# Patient Record
Sex: Male | Born: 1940 | Race: White | Hispanic: No | Marital: Married | Smoking: Never smoker
Health system: Southern US, Community
[De-identification: ages and names within clinical notes are randomized; demographics above are authoritative.]

## PROBLEM LIST (undated history)

## (undated) DIAGNOSIS — I509 Heart failure, unspecified: Secondary | ICD-10-CM

## (undated) DIAGNOSIS — I00 Rheumatic fever without heart involvement: Secondary | ICD-10-CM

## (undated) DIAGNOSIS — I10 Essential (primary) hypertension: Secondary | ICD-10-CM

## (undated) DIAGNOSIS — M109 Gout, unspecified: Secondary | ICD-10-CM

## (undated) HISTORY — PX: HAMMER TOE SURGERY: SHX385

---

## 2013-05-12 ENCOUNTER — Other Ambulatory Visit: Payer: Self-pay | Admitting: *Deleted

## 2013-05-12 MED ORDER — ALLOPURINOL 100 MG PO TABS: 100.0000 mg | ORAL_TABLET | Freq: Two times a day (BID) | ORAL | Status: DC

## 2013-09-09 ENCOUNTER — Other Ambulatory Visit: Payer: Self-pay | Admitting: *Deleted

## 2013-09-09 MED ORDER — ALLOPURINOL 100 MG PO TABS: 100.0000 mg | ORAL_TABLET | Freq: Two times a day (BID) | ORAL | Status: DC

## 2013-09-09 NOTE — Telephone Encounter (Signed)
[facility] sent fax over requesting allopurinol 100 mg. Filled by dr Milinda Pointer.

## 2014-09-22 ENCOUNTER — Encounter
Admission: RE | Admit: 2014-09-22 | Discharge: 2014-09-22 | Disposition: A | Discharge status: Home / Self Care | Payer: Medicare PPO | Source: Ambulatory Visit | Attending: Ophthalmology | Admitting: Ophthalmology

## 2014-09-22 DIAGNOSIS — Z01812 Encounter for preprocedural laboratory examination: Secondary | ICD-10-CM | POA: Insufficient documentation

## 2014-09-22 DIAGNOSIS — H2512 Age-related nuclear cataract, left eye: Secondary | ICD-10-CM | POA: Insufficient documentation

## 2014-09-22 DIAGNOSIS — Z79899 Other long term (current) drug therapy: Secondary | ICD-10-CM | POA: Diagnosis not present

## 2014-09-22 DIAGNOSIS — I10 Essential (primary) hypertension: Secondary | ICD-10-CM | POA: Diagnosis not present

## 2014-09-22 DIAGNOSIS — Z01810 Encounter for preprocedural cardiovascular examination: Secondary | ICD-10-CM | POA: Insufficient documentation

## 2014-09-22 HISTORY — DX: Essential (primary) hypertension: I10

## 2014-09-22 HISTORY — DX: Gout, unspecified: M10.9

## 2014-09-22 LAB — POTASSIUM: POTASSIUM: 3.7 mmol/L (ref 3.5–5.1)

## 2014-09-26 ENCOUNTER — Ambulatory Visit: Payer: Medicare PPO | Admitting: *Deleted

## 2014-09-26 ENCOUNTER — Encounter: Payer: Self-pay | Admitting: Ophthalmology

## 2014-09-26 ENCOUNTER — Ambulatory Visit
Admission: RE | Admit: 2014-09-26 | Discharge: 2014-09-26 | Disposition: A | Discharge status: Home / Self Care | Payer: Medicare PPO | Source: Ambulatory Visit | Attending: Ophthalmology | Admitting: Ophthalmology

## 2014-09-26 ENCOUNTER — Encounter
Admission: RE | Disposition: A | Discharge status: Home / Self Care | Payer: Self-pay | Source: Ambulatory Visit | Attending: Ophthalmology

## 2014-09-26 DIAGNOSIS — I10 Essential (primary) hypertension: Secondary | ICD-10-CM | POA: Insufficient documentation

## 2014-09-26 DIAGNOSIS — Z7982 Long term (current) use of aspirin: Secondary | ICD-10-CM | POA: Diagnosis not present

## 2014-09-26 DIAGNOSIS — H2511 Age-related nuclear cataract, right eye: Secondary | ICD-10-CM | POA: Insufficient documentation

## 2014-09-26 DIAGNOSIS — Z79899 Other long term (current) drug therapy: Secondary | ICD-10-CM | POA: Insufficient documentation

## 2014-09-26 DIAGNOSIS — M109 Gout, unspecified: Secondary | ICD-10-CM | POA: Diagnosis not present

## 2014-09-26 HISTORY — PX: CATARACT EXTRACTION W/PHACO: SHX586

## 2014-09-26 SURGERY — PHACOEMULSIFICATION, CATARACT, WITH IOL INSERTION
Anesthesia: Monitor Anesthesia Care | Site: Eye | Laterality: Left | Wound class: Clean

## 2014-09-26 MED ORDER — LIDOCAINE HCL (PF) 4 % IJ SOLN
INTRAMUSCULAR | Status: AC
  Filled 2014-09-26: qty 10

## 2014-09-26 MED ORDER — MOXIFLOXACIN HCL 0.5 % OP SOLN - NO CHARGE
OPHTHALMIC | Status: DC | PRN
  Administered 2014-09-26: 1 [drp]

## 2014-09-26 MED ORDER — BUPIVACAINE HCL (PF) 0.75 % IJ SOLN
INTRAMUSCULAR | Status: AC
  Filled 2014-09-26: qty 10

## 2014-09-26 MED ORDER — NA CHONDROIT SULF-NA HYALURON 40-17 MG/ML IO SOLN
INTRAOCULAR | Status: DC | PRN
Start: 2014-09-26 — End: 2014-09-26
  Administered 2014-09-26: 1 mL via INTRAOCULAR

## 2014-09-26 MED ORDER — CEFUROXIME OPHTHALMIC INJECTION 1 MG/0.1 ML
INJECTION | OPHTHALMIC | Status: AC
  Filled 2014-09-26: qty 0.1

## 2014-09-26 MED ORDER — SODIUM CHLORIDE 0.9 % IV SOLN
INTRAVENOUS | Status: DC
  Administered 2014-09-26: 500 mL via INTRAVENOUS

## 2014-09-26 MED ORDER — TETRACAINE HCL 0.5 % OP SOLN
OPHTHALMIC | Status: DC | PRN
  Administered 2014-09-26: 1 [drp]

## 2014-09-26 MED ORDER — ALFENTANIL 500 MCG/ML IJ INJ
INJECTION | INTRAMUSCULAR | Status: DC | PRN
  Administered 2014-09-26: 1000 ug via INTRAVENOUS

## 2014-09-26 MED ORDER — CEFUROXIME OPHTHALMIC INJECTION 1 MG/0.1 ML
INJECTION | OPHTHALMIC | Status: DC | PRN
  Administered 2014-09-26: 0.1 mL via INTRACAMERAL

## 2014-09-26 MED ORDER — FENTANYL CITRATE (PF) 100 MCG/2ML IJ SOLN
INTRAMUSCULAR | Status: DC | PRN
  Administered 2014-09-26: 50 ug via INTRAVENOUS

## 2014-09-26 MED ORDER — MIDAZOLAM HCL 2 MG/2ML IJ SOLN
INTRAMUSCULAR | Status: DC | PRN
  Administered 2014-09-26: 0.5 mg via INTRAVENOUS

## 2014-09-26 MED ORDER — CARBACHOL 0.01 % IO SOLN
INTRAOCULAR | Status: DC | PRN
  Administered 2014-09-26: 0.5 mL via INTRAOCULAR

## 2014-09-26 MED ORDER — PHENYLEPHRINE HCL 10 % OP SOLN
1.0000 [drp] | OPHTHALMIC | Status: AC
  Administered 2014-09-26 (×4): 1 [drp] via OPHTHALMIC

## 2014-09-26 MED ORDER — TETRACAINE HCL 0.5 % OP SOLN
OPHTHALMIC | Status: AC
  Filled 2014-09-26: qty 2

## 2014-09-26 MED ORDER — EPINEPHRINE HCL 1 MG/ML IJ SOLN
INTRAMUSCULAR | Status: AC
  Filled 2014-09-26: qty 2

## 2014-09-26 MED ORDER — HYALURONIDASE HUMAN 150 UNIT/ML IJ SOLN
INTRAMUSCULAR | Status: AC
  Filled 2014-09-26: qty 1

## 2014-09-26 MED ORDER — EPINEPHRINE HCL 1 MG/ML IJ SOLN
INTRAOCULAR | Status: DC | PRN
  Administered 2014-09-26: 200 mL

## 2014-09-26 MED ORDER — CYCLOPENTOLATE HCL 2 % OP SOLN
1.0000 [drp] | OPHTHALMIC | Status: AC
  Administered 2014-09-26 (×4): 1 [drp] via OPHTHALMIC

## 2014-09-26 MED ORDER — LIDOCAINE HCL (PF) 1 % IJ SOLN
INTRAOCULAR | Status: DC | PRN
  Administered 2014-09-26: .5 mL

## 2014-09-26 MED ORDER — NA CHONDROIT SULF-NA HYALURON 40-17 MG/ML IO SOLN
INTRAOCULAR | Status: AC
  Filled 2014-09-26: qty 1

## 2014-09-26 MED ORDER — LIDOCAINE HCL (PF) 4 % IJ SOLN
INTRAMUSCULAR | Status: DC | PRN
  Administered 2014-09-26: 12:00:00

## 2014-09-26 MED ORDER — MOXIFLOXACIN HCL 0.5 % OP SOLN
1.0000 [drp] | OPHTHALMIC | Status: AC
  Administered 2014-09-26 (×3): 1 [drp] via OPHTHALMIC

## 2014-09-26 SURGICAL SUPPLY — 28 items
AVTIVE FMS ×3 IMPLANT
CORD BIP STRL DISP 12FT (MISCELLANEOUS) ×3 IMPLANT
DRAPE XRAY CASSETTE 23X24 (DRAPES) ×3 IMPLANT
ERASER HMR WETFIELD 18G (MISCELLANEOUS) ×3 IMPLANT
GLOVE BIO SURGEON STRL SZ8 (GLOVE) ×3 IMPLANT
GLOVE SURG LX 6.5 MICRO (GLOVE) ×2
GLOVE SURG LX 8.0 MICRO (GLOVE) ×2
GLOVE SURG LX STRL 6.5 MICRO (GLOVE) ×1 IMPLANT
GLOVE SURG LX STRL 8.0 MICRO (GLOVE) ×1 IMPLANT
GOWN STRL REUS W/ TWL LRG LVL3 (GOWN DISPOSABLE) ×1 IMPLANT
GOWN STRL REUS W/ TWL XL LVL3 (GOWN DISPOSABLE) ×1 IMPLANT
GOWN STRL REUS W/TWL LRG LVL3 (GOWN DISPOSABLE) ×2
GOWN STRL REUS W/TWL XL LVL3 (GOWN DISPOSABLE) ×2
LENS IOL ACRSF IQ ULTRA 16.5 (Intraocular Lens) ×1 IMPLANT
LENS IOL ACRYSOF IQ 16.5 (Intraocular Lens) ×3 IMPLANT
PACK CATARACT (MISCELLANEOUS) ×3 IMPLANT
PACK CATARACT DINGLEDEIN LX (MISCELLANEOUS) ×3 IMPLANT
PACK EYE AFTER SURG (MISCELLANEOUS) ×3 IMPLANT
SHLD EYE VISITEC  UNIV (MISCELLANEOUS) ×3 IMPLANT
SOL PREP PVP 2OZ (MISCELLANEOUS) ×3
SOLUTION PREP PVP 2OZ (MISCELLANEOUS) ×1 IMPLANT
SUT ETHILON 10 0 CS140 6 (SUTURE) ×3 IMPLANT
SUT SILK 5-0 (SUTURE) ×3 IMPLANT
SYR 5ML LL (SYRINGE) ×3 IMPLANT
SYR TB 1ML 27GX1/2 LL (SYRINGE) ×3 IMPLANT
WATER STERILE IRR 1000ML POUR (IV SOLUTION) ×3 IMPLANT
WIPE NON LINTING 3.25X3.25 (MISCELLANEOUS) ×3 IMPLANT
ultrasert lens 16.5 ×3 IMPLANT

## 2014-09-26 NOTE — Interval H&P Note (Signed)
History and Physical Interval Note:  09/26/2014 11:49 AM  Gary Harmon  has presented today for surgery, with the diagnosis of CATARACT  The various methods of treatment have been discussed with the patient and family. After consideration of risks, benefits and other options for treatment, the patient has consented to  Procedure(s): CATARACT EXTRACTION PHACO AND INTRAOCULAR LENS PLACEMENT ([place]) (Left) as a surgical intervention .  The patient's history has been reviewed, patient examined, no change in status, stable for surgery.  I have reviewed the patient's chart and labs.  Questions were answered to the patient's satisfaction.     [name]

## 2014-09-26 NOTE — Anesthesia Postprocedure Evaluation (Signed)
  Anesthesia Post-op Note  Patient: Gary Harmon  Procedure(s) Performed: Procedure(s) with comments: CATARACT EXTRACTION PHACO AND INTRAOCULAR LENS PLACEMENT ([facility]) (Left) - [place] 01:45 AP% 25.5 CDE 47.47  Anesthesia type:MAC  Patient location: PACU  Post pain: Pain level controlled  Post assessment: Post-op Vital signs reviewed, Patient's Cardiovascular Status Stable, Respiratory Function Stable, Patent Airway and No signs of Nausea or vomiting  Post vital signs: Reviewed and stable  Last Vitals:  Filed Vitals:   09/26/14 1241  BP: 127/100  Pulse:   Temp: 36.1 C  Resp: 16    Level of consciousness: awake, alert  and patient cooperative  Complications: No apparent anesthesia complications

## 2014-09-26 NOTE — Anesthesia Preprocedure Evaluation (Signed)
Anesthesia Evaluation  Patient identified by MRN, date of birth, ID band Patient awake    Reviewed: Allergy & Precautions, NPO status , Patient's Chart, lab work & pertinent test results  Airway Mallampati: II  TM Distance: >3 FB Neck ROM: Limited    Dental  (+) Teeth Intact   Pulmonary    Pulmonary exam normal       Cardiovascular hypertension, Pt. on medications and Pt. on home beta blockers Normal cardiovascular exam    Neuro/Psych    GI/Hepatic   Endo/Other    Renal/GU      Musculoskeletal   Abdominal (+) + obese,   Peds  Hematology   Anesthesia Other Findings   Reproductive/Obstetrics                             Anesthesia Physical Anesthesia Plan  ASA: II  Anesthesia Plan: MAC   Post-op Pain Management:    Induction:   Airway Management Planned: Nasal Cannula  Additional Equipment:   Intra-op Plan:   Post-operative Plan:   Informed Consent: I have reviewed the patients History and Physical, chart, labs and discussed the procedure including the risks, benefits and alternatives for the proposed anesthesia with the patient or authorized representative who has indicated his/her understanding and acceptance.     Plan Discussed with: CRNA  Anesthesia Plan Comments:         Anesthesia Quick Evaluation

## 2014-09-26 NOTE — H&P (Signed)
  History and physical was faxed and scanned in.   

## 2014-09-26 NOTE — Anesthesia Postprocedure Evaluation (Signed)
  Anesthesia Post-op Note  Patient: Gary Harmon  Procedure(s) Performed: Procedure(s) with comments: CATARACT EXTRACTION PHACO AND INTRAOCULAR LENS PLACEMENT ([facility]) (Left) - [place] 01:45 AP% 25.5 CDE 47.47  Anesthesia type:MAC  Patient location: PACU  Post pain: Pain level controlled  Post assessment: Post-op Vital signs reviewed, Patient's Cardiovascular Status Stable, Respiratory Function Stable, Patent Airway and No signs of Nausea or vomiting  Post vital signs: Reviewed and stable  Last Vitals:  Filed Vitals:   09/26/14 1037  BP: 164/94  Pulse:   Temp:   Resp:     Level of consciousness: awake, alert  and patient cooperative  Complications: No apparent anesthesia complications

## 2014-09-26 NOTE — Op Note (Signed)
Date of Surgery: 09/26/2014 Date of Dictation: 09/26/2014 12:39 PM Pre-operative Diagnosis:  Nuclear Sclerotic Cataract right Eye Post-operative Diagnosis: same Procedure performed: Extra-capsular Cataract Extraction (ECCE) with placement of a posterior chamber intraocular lens (IOL) right Eye IOL:  Implant Name Type Inv. Item Serial No. Manufacturer Lot No. LRB No. Used  ultrasert lens 16.5     [id number]     Left 1   Anesthesia: 2% Lidocaine and 4% Marcaine in a 50/50 mixture with 10 unites/ml of Hylenex given as a peribulbar Anesthesiologist: Anesthesiologist: Elyse Hsu, MD CRNA: Bernardo Heater, CRNA Complications: none Estimated Blood Loss: less than 1 ml  Description of procedure:  The patient was given anesthesia and sedation via intravenous access. The patient was then prepped and draped in the usual fashion. A 25-gauge needle was bent for initiating the capsulorhexis. A 5-0 silk suture was placed through the conjunctiva superior and inferiorly to serve as bridle sutures. Hemostasis was obtained at the superior limbus using an eraser cautery. A partial thickness groove was made at the anterior surgical limbus with a 64 Beaver blade and this was dissected anteriorly with an [employer]. The anterior chamber was entered at 10 o'clock with a 1.0 mm paracentesis knife and through the lamellar dissection with a 2.6 mm Alcon keratome. DiscoVisc was injected to replace the aqueous and a continuous tear curvilinear capsulorhexis was performed using a bent 25-gauge needle.  Balance salt on a syringe was used to perform hydro-dissection and phacoemulsification was carried out using a divide and conquer technique. Procedure(s) with comments: CATARACT EXTRACTION PHACO AND INTRAOCULAR LENS PLACEMENT ([facility]) (Left) - [place] 01:45 AP% 25.5 CDE 47.47. Irrigation/aspiration was used to remove the residual cortex and the capsular bag was inflated with DiscoVisc. The intraocular lens was inserted into the  capsular bag using a pre-loaded UltraSert Delivery System. Irrigation/aspiration was used to remove the residual DiscoVisc. The wound was inflated with balanced salt and checked for leaks. None were found. Miostat was injected via the paracentesis track and 0.1 ml of cefuroxime containing 1 mg of drug  was injected via the paracentesis track. The wound was checked for leaks again and none were found.   The bridal sutures were removed and two drops of Vigamox were placed on the eye. An eye shield was placed to protect the eye and the patient was discharged to the recovery area in good condition.   Bonita Brindisi MD

## 2014-09-26 NOTE — Transfer of Care (Signed)
Immediate Anesthesia Transfer of Care Note  Patient: Gary Harmon  Procedure(s) Performed: Procedure(s) with comments: CATARACT EXTRACTION PHACO AND INTRAOCULAR LENS PLACEMENT ([facility]) (Left) - [place] 01:45 AP% 25.5 CDE 47.47  Patient Location: PACU  Anesthesia Type:MAC  Level of Consciousness: awake, alert  and oriented  Airway & Oxygen Therapy: Patient Spontanous Breathing  Post-op Assessment: Report given to RN and Post -op Vital signs reviewed and stable  Post vital signs: Reviewed and stable  Last Vitals:  Filed Vitals:   09/26/14 1241  BP: 127/100  Pulse:   Temp: 36.1 C  Resp: 16    Complications: No apparent anesthesia complications

## 2014-09-26 NOTE — Discharge Instructions (Addendum)
handoutAMBULATORY SURGERY  DISCHARGE INSTRUCTIONS   1) The drugs that you were given will stay in your system until tomorrow so for the next 24 hours you should not:  A) Drive an automobile B) Make any legal decisions C) Drink any alcoholic beverage   2) You may resume regular meals tomorrow.  Today it is better to start with liquids and gradually work up to solid foods.  You may eat anything you prefer, but it is better to start with liquids, then soup and crackers, and gradually work up to solid foods.   3) Please notify your doctor immediately if you have any unusual bleeding, trouble breathing, redness and pain at the surgery site, drainage, fever, or pain not relieved by medication. 4)   5) Your post-operative visit with Dr.                                     is: Date:                        Time:    Please call to schedule your post-operative visit.  6) Additional Instructions: Eye Surgery Discharge Instructions  Expect mild scratchy sensation or mild soreness. DO NOT RUB YOUR EYE!  The day of surgery:  Minimal physical activity, but bed rest is not required  No reading, computer work, or close hand work  No bending, lifting, or straining.  May watch TV  For 24 hours:  No driving, legal decisions, or alcoholic beverages  Safety precautions  Eat anything you prefer: It is better to start with liquids, then soup then solid foods.  _____ Eye patch should be worn until postoperative exam tomorrow.  ____ Solar shield eyeglasses should be worn for comfort in the sunlight/patch while sleeping  Resume all regular medications including aspirin or Coumadin if these were discontinued prior to surgery. You may shower, bathe, shave, or wash your hair. Tylenol may be taken for mild discomfort.  Call your doctor if you experience significant pain, nausea, or vomiting, fever > 101 or other signs of infection. [phone] or [phone] Specific  instructions:  Follow-up Information    Follow up On 09/27/2014.   Why:  10:10

## 2014-09-29 ENCOUNTER — Other Ambulatory Visit: Payer: Self-pay | Admitting: *Deleted

## 2014-09-29 MED ORDER — ALLOPURINOL 100 MG PO TABS: 100.0000 mg | ORAL_TABLET | Freq: Two times a day (BID) | ORAL | Status: DC

## 2015-07-10 ENCOUNTER — Encounter: Payer: Self-pay | Admitting: Emergency Medicine

## 2015-07-10 ENCOUNTER — Inpatient Hospital Stay
Admission: EM | Admit: 2015-07-10 | Discharge: 2015-07-12 | DRG: 292 | Disposition: A | Discharge status: Home / Self Care | Payer: Medicare Other | Attending: Internal Medicine | Admitting: Internal Medicine

## 2015-07-10 ENCOUNTER — Emergency Department: Payer: Medicare Other

## 2015-07-10 DIAGNOSIS — M109 Gout, unspecified: Secondary | ICD-10-CM | POA: Diagnosis present

## 2015-07-10 DIAGNOSIS — Z79899 Other long term (current) drug therapy: Secondary | ICD-10-CM | POA: Diagnosis not present

## 2015-07-10 DIAGNOSIS — R0600 Dyspnea, unspecified: Secondary | ICD-10-CM | POA: Diagnosis present

## 2015-07-10 DIAGNOSIS — I110 Hypertensive heart disease with heart failure: Secondary | ICD-10-CM | POA: Diagnosis present

## 2015-07-10 DIAGNOSIS — E669 Obesity, unspecified: Secondary | ICD-10-CM | POA: Diagnosis present

## 2015-07-10 DIAGNOSIS — E876 Hypokalemia: Secondary | ICD-10-CM | POA: Diagnosis present

## 2015-07-10 DIAGNOSIS — Z7982 Long term (current) use of aspirin: Secondary | ICD-10-CM

## 2015-07-10 DIAGNOSIS — Z6832 Body mass index (BMI) 32.0-32.9, adult: Secondary | ICD-10-CM

## 2015-07-10 DIAGNOSIS — Z23 Encounter for immunization: Secondary | ICD-10-CM

## 2015-07-10 DIAGNOSIS — I248 Other forms of acute ischemic heart disease: Secondary | ICD-10-CM | POA: Diagnosis present

## 2015-07-10 DIAGNOSIS — Z8249 Family history of ischemic heart disease and other diseases of the circulatory system: Secondary | ICD-10-CM | POA: Diagnosis not present

## 2015-07-10 DIAGNOSIS — R778 Other specified abnormalities of plasma proteins: Secondary | ICD-10-CM

## 2015-07-10 DIAGNOSIS — I5031 Acute diastolic (congestive) heart failure: Secondary | ICD-10-CM | POA: Diagnosis present

## 2015-07-10 DIAGNOSIS — I509 Heart failure, unspecified: Secondary | ICD-10-CM

## 2015-07-10 DIAGNOSIS — R7989 Other specified abnormal findings of blood chemistry: Secondary | ICD-10-CM

## 2015-07-10 HISTORY — DX: Rheumatic fever without heart involvement: I00

## 2015-07-10 LAB — TROPONIN I
TROPONIN I: 0.26 ng/mL — AB (ref <0.031)
Troponin I: 0.21 ng/mL — ABNORMAL HIGH (ref <0.031)

## 2015-07-10 LAB — BASIC METABOLIC PANEL
ANION GAP: 10 (ref 5–15)
BUN: 25 mg/dL — ABNORMAL HIGH (ref 6–20)
CO2: 23 mmol/L (ref 22–32)
Calcium: 9.4 mg/dL (ref 8.9–10.3)
Chloride: 104 mmol/L (ref 101–111)
Creatinine, Ser: 1.07 mg/dL (ref 0.61–1.24)
GFR calc Af Amer: >60 mL/min (ref >60)
GLUCOSE: 109 mg/dL — AB (ref 65–99)
POTASSIUM: 3.5 mmol/L (ref 3.5–5.1)
Sodium: 137 mmol/L (ref 135–145)

## 2015-07-10 LAB — CBC
HEMATOCRIT: 41.2 % (ref 40.0–52.0)
HEMOGLOBIN: 14.3 g/dL (ref 13.0–18.0)
MCH: 30.1 pg (ref 26.0–34.0)
MCHC: 34.8 g/dL (ref 32.0–36.0)
MCV: 86.6 fL (ref 80.0–100.0)
Platelets: 254 10*3/uL (ref 150–440)
RBC: 4.75 MIL/uL (ref 4.40–5.90)
RDW: 13 % (ref 11.5–14.5)
WBC: 10.2 10*3/uL (ref 3.8–10.6)

## 2015-07-10 LAB — TSH: TSH: 2.037 u[IU]/mL (ref 0.350–4.500)

## 2015-07-10 LAB — BRAIN NATRIURETIC PEPTIDE: B NATRIURETIC PEPTIDE 5: 540 pg/mL — AB (ref 0.0–100.0)

## 2015-07-10 MED ORDER — ACETAMINOPHEN 325 MG PO TABS: 650.0000 mg | ORAL_TABLET | Freq: Four times a day (QID) | ORAL | Status: DC | PRN

## 2015-07-10 MED ORDER — ENOXAPARIN SODIUM 40 MG/0.4ML ~~LOC~~ SOLN
40.0000 mg | SUBCUTANEOUS | Status: DC
  Administered 2015-07-11: 40 mg via SUBCUTANEOUS
  Filled 2015-07-10 (×2): qty 0.4

## 2015-07-10 MED ORDER — MORPHINE SULFATE (PF) 2 MG/ML IV SOLN: 1.0000 mg | INTRAVENOUS | Status: DC | PRN

## 2015-07-10 MED ORDER — NITROGLYCERIN 2 % TD OINT
1.0000 [in_us] | TOPICAL_OINTMENT | Freq: Once | TRANSDERMAL | Status: AC
  Administered 2015-07-10: 1 [in_us] via TOPICAL
  Filled 2015-07-10: qty 1

## 2015-07-10 MED ORDER — ASPIRIN 81 MG PO CHEW
324.0000 mg | CHEWABLE_TABLET | Freq: Once | ORAL | Status: AC
  Administered 2015-07-10: 324 mg via ORAL
  Filled 2015-07-10: qty 4

## 2015-07-10 MED ORDER — FUROSEMIDE 10 MG/ML IJ SOLN
40.0000 mg | Freq: Once | INTRAMUSCULAR | Status: AC
  Administered 2015-07-10: 40 mg via INTRAVENOUS
  Filled 2015-07-10: qty 4

## 2015-07-10 MED ORDER — METOPROLOL TARTRATE 1 MG/ML IV SOLN
5.0000 mg | Freq: Once | INTRAVENOUS | Status: AC
  Administered 2015-07-10: 5 mg via INTRAVENOUS
  Filled 2015-07-10: qty 5

## 2015-07-10 MED ORDER — ONDANSETRON HCL 4 MG/2ML IJ SOLN: 4.0000 mg | Freq: Four times a day (QID) | INTRAMUSCULAR | Status: DC | PRN

## 2015-07-10 MED ORDER — ENOXAPARIN SODIUM 120 MG/0.8ML ~~LOC~~ SOLN
1.0000 mg/kg | Freq: Once | SUBCUTANEOUS | Status: AC
  Administered 2015-07-10: 105 mg via SUBCUTANEOUS
  Filled 2015-07-10: qty 0.8

## 2015-07-10 MED ORDER — ONDANSETRON HCL 4 MG PO TABS: 4.0000 mg | ORAL_TABLET | Freq: Four times a day (QID) | ORAL | Status: DC | PRN

## 2015-07-10 MED ORDER — SODIUM CHLORIDE 0.9% FLUSH
3.0000 mL | INTRAVENOUS | Status: DC | PRN
  Administered 2015-07-12: 3 mL via INTRAVENOUS
  Filled 2015-07-10: qty 3

## 2015-07-10 MED ORDER — LORAZEPAM 2 MG/ML IJ SOLN
1.0000 mg | Freq: Once | INTRAMUSCULAR | Status: AC
  Administered 2015-07-10: 1 mg via INTRAVENOUS
  Filled 2015-07-10: qty 1

## 2015-07-10 MED ORDER — SODIUM CHLORIDE 0.9% FLUSH
3.0000 mL | Freq: Two times a day (BID) | INTRAVENOUS | Status: DC
  Administered 2015-07-10 – 2015-07-11 (×2): 3 mL via INTRAVENOUS

## 2015-07-10 MED ORDER — SODIUM CHLORIDE 0.9 % IV SOLN: 250.0000 mL | INTRAVENOUS | Status: DC | PRN

## 2015-07-10 MED ORDER — METOPROLOL SUCCINATE ER 100 MG PO TB24
100.0000 mg | ORAL_TABLET | Freq: Every day | ORAL | Status: DC
  Administered 2015-07-11 – 2015-07-12 (×2): 100 mg via ORAL
  Filled 2015-07-10 (×2): qty 1

## 2015-07-10 MED ORDER — ASPIRIN EC 325 MG PO TBEC
325.0000 mg | DELAYED_RELEASE_TABLET | Freq: Every day | ORAL | Status: DC
  Filled 2015-07-10: qty 1

## 2015-07-10 MED ORDER — METOPROLOL TARTRATE 100 MG PO TABS: 100.0000 mg | ORAL_TABLET | Freq: Two times a day (BID) | ORAL | Status: DC

## 2015-07-10 MED ORDER — ASPIRIN EC 81 MG PO TBEC
81.0000 mg | DELAYED_RELEASE_TABLET | Freq: Every day | ORAL | Status: DC
  Administered 2015-07-11 – 2015-07-12 (×2): 81 mg via ORAL
  Filled 2015-07-10 (×2): qty 1

## 2015-07-10 MED ORDER — BENAZEPRIL HCL 20 MG PO TABS
20.0000 mg | ORAL_TABLET | Freq: Every day | ORAL | Status: DC
  Administered 2015-07-10 – 2015-07-12 (×3): 20 mg via ORAL
  Filled 2015-07-10 (×5): qty 1

## 2015-07-10 MED ORDER — LORAZEPAM 0.5 MG PO TABS
0.5000 mg | ORAL_TABLET | Freq: Four times a day (QID) | ORAL | Status: DC | PRN
  Administered 2015-07-10 – 2015-07-11 (×2): 0.5 mg via ORAL
  Filled 2015-07-10 (×2): qty 1

## 2015-07-10 MED ORDER — SODIUM CHLORIDE 0.9% FLUSH
3.0000 mL | Freq: Two times a day (BID) | INTRAVENOUS | Status: DC
  Administered 2015-07-10 – 2015-07-11 (×3): 3 mL via INTRAVENOUS

## 2015-07-10 MED ORDER — HYDROCODONE-ACETAMINOPHEN 5-325 MG PO TABS
1.0000 | ORAL_TABLET | ORAL | Status: DC | PRN
  Administered 2015-07-11: 1 via ORAL
  Filled 2015-07-10: qty 1

## 2015-07-10 MED ORDER — METOPROLOL SUCCINATE ER 100 MG PO TB24: 100.0000 mg | ORAL_TABLET | Freq: Every day | ORAL | Status: DC

## 2015-07-10 MED ORDER — FUROSEMIDE 10 MG/ML IJ SOLN
40.0000 mg | Freq: Two times a day (BID) | INTRAMUSCULAR | Status: DC
  Administered 2015-07-11 (×2): 40 mg via INTRAVENOUS
  Filled 2015-07-10 (×2): qty 4

## 2015-07-10 MED ORDER — METOPROLOL TARTRATE 25 MG PO TABS: 25.0000 mg | ORAL_TABLET | Freq: Two times a day (BID) | ORAL | Status: DC

## 2015-07-10 MED ORDER — ACETAMINOPHEN 650 MG RE SUPP: 650.0000 mg | Freq: Four times a day (QID) | RECTAL | Status: DC | PRN

## 2015-07-10 MED ORDER — ALLOPURINOL 100 MG PO TABS
100.0000 mg | ORAL_TABLET | Freq: Every day | ORAL | Status: DC
  Administered 2015-07-10 – 2015-07-12 (×3): 100 mg via ORAL
  Filled 2015-07-10 (×3): qty 1

## 2015-07-10 NOTE — ED Notes (Signed)
Pt presents to ED with complaints of SOB and cough. Pt states was seen and treated for a sinus infection at an Urgent Care a couple of weeks ago but the antibiotic was discontinued by his PCP. Pt states the cough and SOB have increased since last week.

## 2015-07-10 NOTE — ED Provider Notes (Signed)
[facility] Emergency Department Provider Note     Time seen: ----------------------------------------- 1:00 PM on 07/10/2015 -----------------------------------------    I have reviewed the triage vital signs and the nursing notes.   HISTORY  Chief Complaint Cough and Shortness of Breath    HPI Gary Harmon is a 75 y.o. male who presents to ER with complaints of shortness of breath and cough, states he was recently seen and treated for sinusitis by urgent care physician a couple weeks ago. Patient states the antibiotic was continued by his primary care doctor. Patient states cough and shortness of breath have increased, he can only walk about a block without getting very short of breath. He's also had epigastric discomfort, denies any recent fluid or weight gain. Patient denies a history of this before.   Past Medical History  Diagnosis Date  . Hypertension   . Gout   . Rheumatic fever     There are no active problems to display for this patient.   Past Surgical History  Procedure Laterality Date  . Cataract extraction w/phaco Left 09/26/2014    Procedure: CATARACT EXTRACTION PHACO AND INTRAOCULAR LENS PLACEMENT ([facility]);  Surgeon: Estill Cotta, MD;  Location: [facility] ORS;  Service: Ophthalmology;  Laterality: Left;  [place] 01:45 AP% 25.5 CDE 47.47    Allergies Review of patient's allergies indicates no known allergies.  Social History Social History  Substance Use Topics  . Smoking status: Never Smoker   . Smokeless tobacco: None  . Alcohol Use: No    Review of Systems Constitutional: Negative for fever. Eyes: Negative for visual changes. ENT: Negative for sore throat. Cardiovascular: Negative for chest pain. Respiratory: Positive for shortness of breath, cough Gastrointestinal: Positive for epigastric pain Genitourinary: Negative for dysuria. Musculoskeletal: Negative for back pain. Skin: Negative for rash. Neurological: Negative for  headaches, focal weakness or numbness.  10-point ROS otherwise negative.  ____________________________________________   PHYSICAL EXAM:  VITAL SIGNS: ED Triage Vitals  Enc Vitals Group     BP 07/10/15 1232 166/122 mmHg     Pulse Rate 07/10/15 1227 116     Resp 07/10/15 1227 16     Temp 07/10/15 1227 98.7 F (37.1 C)     Temp Source 07/10/15 1227 Oral     SpO2 07/10/15 1227 94 %     Weight 07/10/15 1227 230 lb (104.327 kg)     Height 07/10/15 1227 5\' 8"  (1.727 m)     Head Cir --      Peak Flow --      Pain Score 07/10/15 1227 0     Pain Loc --      Pain Edu? --      Excl. in [place]? --     Constitutional: Alert and oriented. Mild distress Eyes: Conjunctivae are normal. PERRL. Normal extraocular movements. ENT   Head: Normocephalic and atraumatic.   Nose: No congestion/rhinnorhea.   Mouth/Throat: Mucous membranes are moist.   Neck: No stridor. Cardiovascular: Irregularly irregular rhythm. Normal and symmetric distal pulses are present in all extremities. No murmurs, rubs, or gallops. Respiratory: Tachypnea with clear breath sounds. Gastrointestinal: Soft and nontender. No distention. No abdominal bruits.  Musculoskeletal: Nontender with normal range of motion in all extremities. No joint effusions.  No lower extremity tenderness, minimal edema is noted. Neurologic:  Normal speech and language. No gross focal neurologic deficits are appreciated.  Skin:  Skin is warm, dry and intact. No rash noted. Psychiatric: Mood and affect are normal. Speech and behavior are normal.  Patient exhibits appropriate insight and judgment. ____________________________________________  EKG: Interpreted by me. Sinus rhythm with multiple PACs, PVCs, normal axis, normal QRS, normal QT interval. Likely anterior lateral infarct age indeterminate.  ____________________________________________  ED COURSE:  Pertinent labs & imaging results that were available during my care of the patient  were reviewed by me and considered in my medical decision making (see chart for details). Patient appears uncomfortable and short of breath, uncertain etiology. Suspect new congestive heart failure. Check basic cardiac labs and chest x-ray and reevaluate. ____________________________________________    LABS (pertinent positives/negatives)  Labs Reviewed  BASIC METABOLIC PANEL - Abnormal; Notable for the following:    Glucose, Bld 109 (*)    BUN 25 (*)    All other components within normal limits  TROPONIN I - Abnormal; Notable for the following:    Troponin I 0.21 (*)    All other components within normal limits  CBC  BRAIN NATRIURETIC PEPTIDE   CRITICAL CARE Performed by: Earleen Newport   Total critical care time: 30 minutes  Critical care time was exclusive of separately billable procedures and treating other patients.  Critical care was necessary to treat or prevent imminent or life-threatening deterioration.  Critical care was time spent personally by me on the following activities: development of treatment plan with patient and/or surrogate as well as nursing, discussions with consultants, evaluation of patient's response to treatment, examination of patient, obtaining history from patient or surrogate, ordering and performing treatments and interventions, ordering and review of laboratory studies, ordering and review of radiographic studies, pulse oximetry and re-evaluation of patient's condition.   RADIOLOGY Images were viewed by me  Chest x-ray IMPRESSION: 1. Very low inspiratory volumes with bibasilar atelectasis. 2. Cardiomegaly and pulmonary vascular congestion without overt edema. 3. Small right pleural effusion. ____________________________________________  FINAL ASSESSMENT AND PLAN  Dyspnea, elevated troponin, tachycardia, new onset congestive heart failure  Plan: Patient with labs and imaging as dictated above. Patient found to have elevated  troponin, he'll receive adult aspirin, Nitropaste, IV Lopressor and Lovenox. Patient is currently medically stable, is not having any chest pain. I will discuss with cardiology. Patient will need serial troponins and echocardiogram.   Earleen Newport, MD   Earleen Newport, MD 07/10/15 [phone]

## 2015-07-10 NOTE — Progress Notes (Signed)
Patient is having runs of PVC'S MD made aware no new order will continue to monitor

## 2015-07-10 NOTE — H&P (Signed)
[facility] [place] NAME: Gary Harmon    MR#:  [id number]  DATE OF BIRTH:  1941/01/31  DATE OF ADMISSION:  07/10/2015  PRIMARY CARE PHYSICIAN: Pcp Not In System   REQUESTING/REFERRING PHYSICIAN: Roderic Palau MD  CHIEF COMPLAINT:   Chief Complaint  Patient presents with  . Cough  . Shortness of Breath    HISTORY OF PRESENT ILLNESS: Gary Harmon  is a 75 y.o. male with a known history of  HTN,  And Gout  Has been having cought and sob since last Thursday. He reports that he's had a productive cough of white sputum. He he reports that the shortness of breath progressively got worse. Initially was with exertion and at rest. He also reported that the breathing was worse with laying flat. He was recently treated for sinusitis. He denies chest pains. In the emergency room he was noted to have acute congestive heart failure. Heart rate is also elevated. PAST MEDICAL HISTORY:   Past Medical History  Diagnosis Date  . Hypertension   . Gout   . Rheumatic fever     PAST SURGICAL HISTORY:  Past Surgical History  Procedure Laterality Date  . Cataract extraction w/phaco Left 09/26/2014    Procedure: CATARACT EXTRACTION PHACO AND INTRAOCULAR LENS PLACEMENT ([facility]);  Surgeon: Estill Cotta, MD;  Location: [facility] ORS;  Service: Ophthalmology;  Laterality: Left;  [place] 01:45 AP% 25.5 CDE 47.47    SOCIAL HISTORY:  Social History  Substance Use Topics  . Smoking status: Never Smoker   . Smokeless tobacco: Not on file  . Alcohol Use: No    FAMILY HISTORY:  Hypertension  DRUG ALLERGIES: No Known Allergies  REVIEW OF SYSTEMS:   CONSTITUTIONAL: No fever, fatigue or weakness.  EYES: No blurred or double vision.  EARS, NOSE, AND THROAT: No tinnitus or ear pain.  RESPIRATORY: Positive cough, positive shortness of breath, occasional wheezing no hemoptysis.  CARDIOVASCULAR: No chest pain, positive orthopnea, mild edema.  GASTROINTESTINAL: No nausea,  vomiting, diarrhea or abdominal pain.  GENITOURINARY: No dysuria, hematuria.  ENDOCRINE: No polyuria, nocturia,  HEMATOLOGY: No anemia, easy bruising or bleeding SKIN: No rash or lesion. MUSCULOSKELETAL: No joint pain or arthritis.   NEUROLOGIC: No tingling, numbness, weakness.  PSYCHIATRY: No anxiety or depression.   MEDICATIONS AT HOME:  Prior to Admission medications   Medication Sig Start Date End Date Taking? Authorizing Provider  acetaminophen (TYLENOL) 325 MG tablet Take 650 mg by mouth every 6 (six) hours as needed.   Yes Historical Provider, MD  allopurinol (ZYLOPRIM) 100 MG tablet Take 1 tablet (100 mg total) by mouth 2 (two) times daily. Patient taking differently: Take 100 mg by mouth daily.  09/29/14  Yes Max T Hyatt, DPM  aspirin EC 81 MG tablet Take 81 mg by mouth daily.   Yes Historical Provider, MD  benazepril (LOTENSIN) 20 MG tablet Take 20 mg by mouth daily.   Yes Historical Provider, MD  hydrochlorothiazide (HYDRODIURIL) 12.5 MG tablet Take 12.5 mg by mouth daily.   Yes Historical Provider, MD  metoprolol succinate (TOPROL-XL) 100 MG 24 hr tablet Take 100 mg by mouth daily.   Yes Historical Provider, MD      PHYSICAL EXAMINATION:   VITAL SIGNS: Blood pressure 166/122, pulse 116, temperature 98.7 F (37.1 C), temperature source Oral, resp. rate 16, height 5\' 8"  (1.727 m), weight 104.327 kg (230 lb), SpO2 94 %.  GENERAL:  75 y.o.-year-old patient lying in the bed with no  acute distress.  EYES: Pupils equal, round, reactive to light and accommodation. No scleral icterus. Extraocular muscles intact.  HEENT: Head atraumatic, normocephalic. Oropharynx and nasopharynx clear.  NECK:  Supple, no jugular venous distention. No thyroid enlargement, no tenderness.  LUNGS: Bilateral crackles at the bases, no wheezing or rhonchi CARDIOVASCULAR: S1, S2 normal tachycardic. No murmurs, rubs, or gallops.  ABDOMEN: Soft, nontender, nondistended. Bowel sounds present. No organomegaly  or mass.  EXTREMITIES: Positive pedal edema, cyanosis, or clubbing.  NEUROLOGIC: Cranial nerves II through XII are intact. Muscle strength 5/5 in all extremities. Sensation intact. Gait not checked.  PSYCHIATRIC: The patient is alert and oriented x 3.  SKIN: No obvious rash, lesion, or ulcer.   LABORATORY PANEL:   CBC  Recent Labs Lab 07/10/15 1238  WBC 10.2  HGB 14.3  HCT 41.2  PLT 254  MCV 86.6  MCH 30.1  MCHC 34.8  RDW 13.0   ------------------------------------------------------------------------------------------------------------------  Chemistries   Recent Labs Lab 07/10/15 1238  NA 137  K 3.5  CL 104  CO2 23  GLUCOSE 109*  BUN 25*  CREATININE 1.07  CALCIUM 9.4   ------------------------------------------------------------------------------------------------------------------ estimated creatinine clearance is 73.1 mL/min (by C-G formula based on Cr of 1.07). ------------------------------------------------------------------------------------------------------------------ No results for input(s): TSH, T4TOTAL, T3FREE, THYROIDAB in the last 72 hours.  Invalid input(s): FREET3   Coagulation profile No results for input(s): INR, PROTIME in the last 168 hours. ------------------------------------------------------------------------------------------------------------------- No results for input(s): DDIMER in the last 72 hours. -------------------------------------------------------------------------------------------------------------------  Cardiac Enzymes  Recent Labs Lab 07/10/15 1238  TROPONINI 0.21*   ------------------------------------------------------------------------------------------------------------------ Invalid input(s): POCBNP  ---------------------------------------------------------------------------------------------------------------  Urinalysis No results found for: COLORURINE, APPEARANCEUR, LABSPEC, PHURINE, GLUCOSEU, HGBUR,  BILIRUBINUR, KETONESUR, PROTEINUR, UROBILINOGEN, NITRITE, LEUKOCYTESUR   RADIOLOGY: Dg Chest 2 View  07/10/2015  CLINICAL DATA:  75 year old male with shortness of breath and cough EXAM: CHEST  2 VIEW COMPARISON:  None. FINDINGS: Borderline cardiomegaly. Very low inspiratory volumes with bibasilar atelectasis. Pulmonary vascular congestion without overt edema. Suspect small right pleural effusion. No pneumothorax. No suspicious nodule or mass. No acute osseous abnormality. Small fluid tracks along the minor fissure. IMPRESSION: 1. Very low inspiratory volumes with bibasilar atelectasis. 2. Cardiomegaly and pulmonary vascular congestion without overt edema. 3. Small right pleural effusion. Electronically Signed   By: Jacqulynn Cadet M.D.   On: 07/10/2015 13:16    EKG: Orders placed or performed during the hospital encounter of 07/10/15  . ED EKG  . ED EKG  . EKG 12-Lead  . EKG 12-Lead  . ED EKG  . ED EKG    IMPRESSION AND PLAN: Patient is a 75 year old white male presents with shortness of breath and cough  1. Shortness of breath due to acute CHF type unknown-we will obtain echocardiogram of the heart, IV Lasix twice a day cardiology consult  2. Elevated troponin could be related to underlying coronary artery disease: I'll start him on aspirin 325 cardiology consult  3. Tachycardia likely related to acute CHF she already on Toprol 100 IV change it to him metoprolol tartrate 100 twice a day  4. Hypertension continue Lotensin hold HCTZ  5. Miscellaneous we'll do Lovenox for DVT prophylaxis  All the records are reviewed and case discussed with ED provider. Management plans discussed with the patient, family and they are in agreement.  CODE STATUS: Full Code Status History    This patient does not have a recorded code status. Please follow your organizational policy for patients in this situation.    Advance Directive Documentation  Most Recent Value   Type of Advance  Directive  Healthcare Power of Attorney, Living will   Pre-existing out of facility DNR order (yellow form or pink MOST form)     "MOST" Form in Place?         TOTAL TIME TAKING CARE OF THIS PATIENT: 55 minutes.    Dustin Flock M.D on 07/10/2015 at 2:25 PM  Between 7am to 6pm - Pager - [phone]  After 6pm go to [email or web address] - password [email or web address] [facility]  Office  [phone]  CC: Primary care physician; Pcp Not In System

## 2015-07-11 LAB — CBC
HEMATOCRIT: 36.9 % — AB (ref 40.0–52.0)
HEMOGLOBIN: 12.8 g/dL — AB (ref 13.0–18.0)
MCH: 29.9 pg (ref 26.0–34.0)
MCHC: 34.8 g/dL (ref 32.0–36.0)
MCV: 85.9 fL (ref 80.0–100.0)
Platelets: 223 10*3/uL (ref 150–440)
RBC: 4.29 MIL/uL — AB (ref 4.40–5.90)
RDW: 13.2 % (ref 11.5–14.5)
WBC: 10.4 10*3/uL (ref 3.8–10.6)

## 2015-07-11 LAB — TROPONIN I
TROPONIN I: 0.21 ng/mL — AB (ref <0.031)
Troponin I: 0.22 ng/mL — ABNORMAL HIGH (ref <0.031)

## 2015-07-11 LAB — MAGNESIUM: Magnesium: 1.3 mg/dL — ABNORMAL LOW (ref 1.7–2.4)

## 2015-07-11 LAB — BASIC METABOLIC PANEL
Anion gap: 8 (ref 5–15)
BUN: 23 mg/dL — AB (ref 6–20)
CHLORIDE: 101 mmol/L (ref 101–111)
CO2: 25 mmol/L (ref 22–32)
CREATININE: 0.98 mg/dL (ref 0.61–1.24)
Calcium: 8.8 mg/dL — ABNORMAL LOW (ref 8.9–10.3)
GFR calc Af Amer: >60 mL/min (ref >60)
GFR calc non Af Amer: >60 mL/min (ref >60)
Glucose, Bld: 102 mg/dL — ABNORMAL HIGH (ref 65–99)
POTASSIUM: 3.2 mmol/L — AB (ref 3.5–5.1)
SODIUM: 134 mmol/L — AB (ref 135–145)

## 2015-07-11 MED ORDER — MAGNESIUM SULFATE 4 GM/100ML IV SOLN
4.0000 g | Freq: Once | INTRAVENOUS | Status: AC
  Administered 2015-07-11: 4 g via INTRAVENOUS
  Filled 2015-07-11: qty 100

## 2015-07-11 MED ORDER — POTASSIUM CHLORIDE CRYS ER 20 MEQ PO TBCR
40.0000 meq | EXTENDED_RELEASE_TABLET | Freq: Two times a day (BID) | ORAL | Status: DC
  Administered 2015-07-11 – 2015-07-12 (×3): 40 meq via ORAL
  Filled 2015-07-11 (×4): qty 2

## 2015-07-11 MED ORDER — SODIUM CHLORIDE 0.9 % IV SOLN
1.0000 g | Freq: Once | INTRAVENOUS | Status: AC
  Administered 2015-07-11: 1 g via INTRAVENOUS
  Filled 2015-07-11: qty 10

## 2015-07-11 MED ORDER — FUROSEMIDE 10 MG/ML IJ SOLN
40.0000 mg | Freq: Every day | INTRAMUSCULAR | Status: DC
  Filled 2015-07-11: qty 4

## 2015-07-11 NOTE — Progress Notes (Signed)
Unifocal PVCs still ongoing off and on. Dr. Marcille Blanco notified but no new order received.  New order for Potassium 40 mEq two times a day received. Leg crump again this morning Vicodin 1 tab PRN administered. Magnesium level ordered to be drawn by Dr. Marcille Blanco. Will continue to monitor.

## 2015-07-11 NOTE — Progress Notes (Signed)
Pt had 15 beat run vtach while sitting in chair, pt asymptomatic.  Dr. Lavetta Nielsen updated, no new orders

## 2015-07-11 NOTE — Consult Note (Addendum)
Reason for Consult: Shortness of breath cough possible heart failure Referring Physician: Dr. Patel. Dr. Williams in emergency room Cardiologist Dr. Kowalski  Gary Harmon is an 75 y.o. male.  HPI: Patient is a 75-year-old male retired history of obesity known hypertension and gout status is been doing reasonably well but developed a cough shortness of breath or congestion for the last 3 or 4 days patient was seen in urgent care with productive white sputum and shortness of breath which is gotten progressively worse he was treated with antibiotics and decongestant. Patient did not improve so went back to urgent care for follow-up different physician thought he did not need antibiotic therapy and just consists continue supportive care for URI inflammation and infection possible bronchitis and sinusitis. Patient denies any significant angina no blackout spells or syncope no known coronary disease or cardiac disease. Reportedly the patient's had rheumatic fever as a child with no significant residual valvular disease. Patient is a retired school administrator. Patient plays a lot of golf he complains of leg cramps sometimes at night no known cardiac history with recent shortness of breath or congestion presented for further evaluation  Past Medical History  Diagnosis Date  . Hypertension   . Gout   . Rheumatic fever     Past Surgical History  Procedure Laterality Date  . Cataract extraction w/phaco Left 09/26/2014    Procedure: CATARACT EXTRACTION PHACO AND INTRAOCULAR LENS PLACEMENT (IOC);  Surgeon: Steven Dingeldein, MD;  Location: [facility] ORS;  Service: Ophthalmology;  Laterality: Left;  US 01:45   . Hammer toe surgery      Family History  Problem Relation Age of Onset  . Hypertension      Social History:  reports that he has never smoked. He does not have any smokeless tobacco history on file. He reports that he does not drink alcohol or use illicit drugs. I have reviewed the patient's  current medications. Allergies: No Known Allergies  Medications:   Results for orders placed or performed during the hospital encounter of 07/10/15 (from the past 48 hour(s))  Basic metabolic panel     Status: Abnormal   Collection Time: 07/10/15 12:38 PM  Result Value Ref Range   Sodium 137 135 - 145 mmol/L   Potassium 3.5 3.5 - 5.1 mmol/L   Chloride 104 101 - 111 mmol/L   CO2 23 22 - 32 mmol/L   Glucose, Bld 109 (H) 65 - 99 mg/dL   BUN 25 (H) 6 - 20 mg/dL   Creatinine, Ser 1.07 0.61 - 1.24 mg/dL   Calcium 9.4 8.9 - 10.3 mg/dL   GFR calc non Af Amer >60 >60 mL/min   GFR calc Af Amer >60 >60 mL/min    Comment: (NOTE) The eGFR has been calculated using the CKD EPI equation. This calculation has not been validated in all clinical situations. eGFR's persistently <60 mL/min signify possible Chronic Kidney Disease.    Anion gap 10 5 - 15  CBC     Status: None   Collection Time: 07/10/15 12:38 PM  Result Value Ref Range   WBC 10.2 3.8 - 10.6 K/uL   RBC 4.75 4.40 - 5.90 MIL/uL   Hemoglobin 14.3 13.0 - 18.0 g/dL   HCT 41.2 40.0 - 52.0 %   MCV 86.6 80.0 - 100.0 fL   MCH 30.1 26.0 - 34.0 pg   MCHC 34.8 32.0 - 36.0 g/dL   RDW 13.0 11.5 - 14.5 %   Platelets 254 150 - 440 K/uL  Troponin   I     Status: Abnormal   Collection Time: 07/10/15 12:38 PM  Result Value Ref Range   Troponin I 0.21 (H) <0.031 ng/mL    Comment: READ BACK AND VERIFIED WITH CRYSTAL SEARLS AT 1309 07/10/15 DAS        PERSISTENTLY INCREASED TROPONIN VALUES IN THE RANGE OF 0.04-0.49 ng/mL CAN BE SEEN IN:       -UNSTABLE ANGINA       -CONGESTIVE HEART FAILURE       -MYOCARDITIS       -CHEST TRAUMA       -ARRYHTHMIAS       -LATE PRESENTING MYOCARDIAL INFARCTION       -COPD   CLINICAL FOLLOW-UP RECOMMENDED.   Brain natriuretic peptide     Status: Abnormal   Collection Time: 07/10/15 12:38 PM  Result Value Ref Range   B Natriuretic Peptide 540.0 (H) 0.0 - 100.0 pg/mL  TSH     Status: None   Collection Time:  07/10/15  7:18 PM  Result Value Ref Range   TSH 2.037 0.350 - 4.500 uIU/mL  Troponin I     Status: Abnormal   Collection Time: 07/10/15  7:18 PM  Result Value Ref Range   Troponin I 0.26 (H) <0.031 ng/mL    Comment: PREVIOUS RESULT CALLED AT  1309 07/10/15 BY DAS.Marland KitchenMarland KitchenSDR        PERSISTENTLY INCREASED TROPONIN VALUES IN THE RANGE OF 0.04-0.49 ng/mL CAN BE SEEN IN:       -UNSTABLE ANGINA       -CONGESTIVE HEART FAILURE       -MYOCARDITIS       -CHEST TRAUMA       -ARRYHTHMIAS       -LATE PRESENTING MYOCARDIAL INFARCTION       -COPD   CLINICAL FOLLOW-UP RECOMMENDED.   CBC     Status: Abnormal   Collection Time: 07/11/15  1:26 AM  Result Value Ref Range   WBC 10.4 3.8 - 10.6 K/uL   RBC 4.29 (L) 4.40 - 5.90 MIL/uL   Hemoglobin 12.8 (L) 13.0 - 18.0 g/dL   HCT 36.9 (L) 40.0 - 52.0 %   MCV 85.9 80.0 - 100.0 fL   MCH 29.9 26.0 - 34.0 pg   MCHC 34.8 32.0 - 36.0 g/dL   RDW 13.2 11.5 - 14.5 %   Platelets 223 150 - 440 K/uL  Basic metabolic panel     Status: Abnormal   Collection Time: 07/11/15  1:26 AM  Result Value Ref Range   Sodium 134 (L) 135 - 145 mmol/L   Potassium 3.2 (L) 3.5 - 5.1 mmol/L   Chloride 101 101 - 111 mmol/L   CO2 25 22 - 32 mmol/L   Glucose, Bld 102 (H) 65 - 99 mg/dL   BUN 23 (H) 6 - 20 mg/dL   Creatinine, Ser 0.98 0.61 - 1.24 mg/dL   Calcium 8.8 (L) 8.9 - 10.3 mg/dL   GFR calc non Af Amer >60 >60 mL/min   GFR calc Af Amer >60 >60 mL/min    Comment: (NOTE) The eGFR has been calculated using the CKD EPI equation. This calculation has not been validated in all clinical situations. eGFR's persistently <60 mL/min signify possible Chronic Kidney Disease.    Anion gap 8 5 - 15  Troponin I     Status: Abnormal   Collection Time: 07/11/15  1:26 AM  Result Value Ref Range   Troponin I 0.21 (H) <0.031 ng/mL    Comment:  PREVIOUS RESULT CALLED BY DAS AT 1309 ON 07/10/2015 CAF        PERSISTENTLY INCREASED TROPONIN VALUES IN THE RANGE OF 0.04-0.49 ng/mL CAN BE  SEEN IN:       -UNSTABLE ANGINA       -CONGESTIVE HEART FAILURE       -MYOCARDITIS       -CHEST TRAUMA       -ARRYHTHMIAS       -LATE PRESENTING MYOCARDIAL INFARCTION       -COPD   CLINICAL FOLLOW-UP RECOMMENDED.   Troponin I     Status: Abnormal   Collection Time: 07/11/15  6:44 AM  Result Value Ref Range   Troponin I 0.22 (H) <0.031 ng/mL    Comment: PREVIOUS RESULT CALLED BY DAS 07/10/15 AT 1309 DAS        PERSISTENTLY INCREASED TROPONIN VALUES IN THE RANGE OF 0.04-0.49 ng/mL CAN BE SEEN IN:       -UNSTABLE ANGINA       -CONGESTIVE HEART FAILURE       -MYOCARDITIS       -CHEST TRAUMA       -ARRYHTHMIAS       -LATE PRESENTING MYOCARDIAL INFARCTION       -COPD   CLINICAL FOLLOW-UP RECOMMENDED.   Magnesium     Status: Abnormal   Collection Time: 07/11/15  6:44 AM  Result Value Ref Range   Magnesium 1.3 (L) 1.7 - 2.4 mg/dL    Dg Chest 2 View  07/10/2015  CLINICAL DATA:  74-year-old male with shortness of breath and cough EXAM: CHEST  2 VIEW COMPARISON:  None. FINDINGS: Borderline cardiomegaly. Very low inspiratory volumes with bibasilar atelectasis. Pulmonary vascular congestion without overt edema. Suspect small right pleural effusion. No pneumothorax. No suspicious nodule or mass. No acute osseous abnormality. Small fluid tracks along the minor fissure. IMPRESSION: 1. Very low inspiratory volumes with bibasilar atelectasis. 2. Cardiomegaly and pulmonary vascular congestion without overt edema. 3. Small right pleural effusion. Electronically Signed   By: Heath  McCullough M.D.   On: 07/10/2015 13:16    Review of Systems  Constitutional: Positive for malaise/fatigue and diaphoresis.  HENT: Positive for congestion and ear discharge.   Eyes: Negative.   Respiratory: Positive for cough and shortness of breath.   Cardiovascular: Positive for orthopnea, leg swelling and PND.  Gastrointestinal: Negative.   Genitourinary: Negative.   Musculoskeletal: Negative.   Skin: Negative.    Neurological: Positive for weakness.  Endo/Heme/Allergies: Negative.   Psychiatric/Behavioral: Negative.    Blood pressure 140/88, pulse 98, temperature 98 F (36.7 C), temperature source Oral, resp. rate 18, height 5' 8" (1.727 m), weight 96.752 kg (213 lb 4.8 oz), SpO2 97 %. Physical Exam  Nursing note and vitals reviewed. Constitutional: He is oriented to person, place, and time. He appears well-developed and well-nourished.  HENT:  Head: Normocephalic and atraumatic.  Right Ear: External ear normal.  Eyes: Conjunctivae and EOM are normal. Pupils are equal, round, and reactive to light.  Neck: Normal range of motion. Neck supple.  Cardiovascular: Normal rate and regular rhythm.   Murmur heard. Respiratory: Effort normal and breath sounds normal.  GI: Soft. Bowel sounds are normal.  Musculoskeletal: Normal range of motion.  Neurological: He is alert and oriented to person, place, and time. He has normal reflexes.  Skin: Skin is warm and dry.  Psychiatric: He has a normal mood and affect.    Assessment/Plan: Shortness of breath Cough congestion Obesity Hypertension Gout Edema Sinusitis Tachycardia Leg   cramps Rheumatic fever as a child Demand ischemia . PLAN Agree with admission for evaluation rule out for myocardial infarction Follow-up EKGs troponins on telemetry Blood pressure control with present Lotensin metoprolol Short-term anticoagulation with Lovenox Possible heart failure continue metoprolol. 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 07/11/2015, 1:54 PM      

## 2015-07-11 NOTE — Care Management (Signed)
CM assessment for discharge planning. New CHF diagnosis. Met with patient and his wife. He is alert, oriented, independent and active. He drives. No DME. Denies issues obtaining medications, copays or medical care. PCP is at [facility] in [place]. No needs anticipated. Case Closed.  

## 2015-07-11 NOTE — Plan of Care (Signed)
Problem: Pain Managment: Goal: General experience of comfort will improve Outcome: Progressing Prn medications  Problem: Fluid Volume: Goal: Ability to maintain a balanced intake and output will improve Outcome: Progressing I&O  Problem: Cardiac: Goal: Ability to achieve and maintain adequate cardiopulmonary perfusion will improve Outcome: Progressing Daily weights

## 2015-07-11 NOTE — Discharge Instructions (Signed)
Heart Failure Clinic appointment on July 24, 2015 at 11:30am with Darylene Price, [place]. Please call [phone] to reschedule.

## 2015-07-11 NOTE — Progress Notes (Signed)
Gary Harmon at [place] NAME: Gary Harmon    MR#:  [id number]  DATE OF BIRTH:  1940/11/16  SUBJECTIVE:  States feeling better however still shortness of breath with activity, family at bedside  REVIEW OF SYSTEMS:  CONSTITUTIONAL: No fever, fatigue or weakness.  EYES: No blurred or double vision.  EARS, NOSE, AND THROAT: No tinnitus or ear pain.  RESPIRATORY: No cough, Positive shortness of breath, denies wheezing or hemoptysis.  CARDIOVASCULAR: No chest pain, orthopnea, edema.  GASTROINTESTINAL: No nausea, vomiting, diarrhea or abdominal pain.  GENITOURINARY: No dysuria, hematuria.  ENDOCRINE: No polyuria, nocturia,  HEMATOLOGY: No anemia, easy bruising or bleeding SKIN: No rash or lesion. MUSCULOSKELETAL: No joint pain or arthritis.   NEUROLOGIC: No tingling, numbness, weakness.  PSYCHIATRY: No anxiety or depression.   DRUG ALLERGIES:  No Known Allergies  VITALS:  Blood pressure 140/88, pulse 98, temperature 98 F (36.7 C), temperature source Oral, resp. rate 18, height 5\' 8"  (1.727 m), weight 96.752 kg (213 lb 4.8 oz), SpO2 97 %.  PHYSICAL EXAMINATION:  VITAL SIGNS: Filed Vitals:   07/11/15 0800 07/11/15 1129  BP: 139/78 140/88  Pulse: 74 98  Temp: 98 F (36.7 C) 98 F (36.7 C)  Resp: 17 18   GENERAL:75 y.o.male currently in no acute distress.  HEAD: Normocephalic, atraumatic.  EYES: Pupils equal, round, reactive to light. Extraocular muscles intact. No scleral icterus.  MOUTH: Moist mucosal membrane. Dentition intact. No abscess noted.  EAR, NOSE, THROAT: Clear without exudates. No external lesions.  NECK: Supple. No thyromegaly. No nodules. No JVD.  PULMONARY: Clear to ascultation, without wheeze rails or rhonci. No use of accessory muscles, Good respiratory effort. good air entry bilaterally CHEST: Nontender to palpation.  CARDIOVASCULAR: S1 and S2. Regular rate and rhythm. No murmurs, rubs, or gallops. Trace edema.  Pedal pulses 2+ bilaterally.  GASTROINTESTINAL: Soft, nontender, nondistended. No masses. Positive bowel sounds. No hepatosplenomegaly.  MUSCULOSKELETAL: No swelling, clubbing, or edema. Range of motion full in all extremities.  NEUROLOGIC: Cranial nerves II through XII are intact. No gross focal neurological deficits. Sensation intact. Reflexes intact.  SKIN: No ulceration, lesions, rashes, or cyanosis. Skin warm and dry. Turgor intact.  PSYCHIATRIC: Mood, affect within normal limits. The patient is awake, alert and oriented x 3. Insight, judgment intact.      LABORATORY PANEL:   CBC  Recent Labs Lab 07/11/15 0126  WBC 10.4  HGB 12.8*  HCT 36.9*  PLT 223   ------------------------------------------------------------------------------------------------------------------  Chemistries   Recent Labs Lab 07/11/15 0126 07/11/15 0644  NA 134*  --   K 3.2*  --   CL 101  --   CO2 25  --   GLUCOSE 102*  --   BUN 23*  --   CREATININE 0.98  --   CALCIUM 8.8*  --   MG  --  1.3*   ------------------------------------------------------------------------------------------------------------------  Cardiac Enzymes  Recent Labs Lab 07/11/15 0644  TROPONINI 0.22*   ------------------------------------------------------------------------------------------------------------------  RADIOLOGY:  Dg Chest 2 View  07/10/2015  CLINICAL DATA:  75 year old male with shortness of breath and cough EXAM: CHEST  2 VIEW COMPARISON:  None. FINDINGS: Borderline cardiomegaly. Very low inspiratory volumes with bibasilar atelectasis. Pulmonary vascular congestion without overt edema. Suspect small right pleural effusion. No pneumothorax. No suspicious nodule or mass. No acute osseous abnormality. Small fluid tracks along the minor fissure. IMPRESSION: 1. Very low inspiratory volumes with bibasilar atelectasis. 2. Cardiomegaly and pulmonary vascular congestion without overt edema. 3.  Small right pleural  effusion. Electronically Signed   By: Jacqulynn Cadet M.D.   On: 07/10/2015 13:16    EKG:   Orders placed or performed during the hospital encounter of 07/10/15  . ED EKG  . ED EKG  . EKG 12-Lead  . EKG 12-Lead  . ED EKG  . ED EKG    ASSESSMENT AND PLAN:   75 year old Caucasian gentleman admitted 07/10/2015 with signs and symptoms consistent with congestive heart failure.  1. Acute congestive heart failure ejection fraction unspecified: Continue with IV diuresis can decrease to daily as opposed to twice a day, echocardiogram pending at this time, cardiology input appreciated further workup dependent on echocardiogram. Continue with medical management for now. Supplemental oxygen as required 2. Elevated troponin: Continue with aspirin, on cardiac telemetry likely demand ischemia 3. Hypokalemia: Replace goal potassium 4-5 4. Hypomagnesemia: Magnesium greater than 2-replace 5. Venous thromboembolism prophylactic: Lovenox     All the records are reviewed and case discussed with Care Management/Social Workerr. Management plans discussed with the patient, family and they are in agreement.  CODE STATUS: Full  TOTAL TIME TAKING CARE OF THIS PATIENT: 28 minutes.   POSSIBLE D/C IN 1 DAYS, DEPENDING ON CLINICAL CONDITION.   Giancarlo Askren,  Karenann Cai.D on 07/11/2015 at 2:07 PM  Between 7am to 6pm - Pager - [phone]  After 6pm: House Pager: - [phone]  [facility]  Office  [phone]  CC: Primary care physician; Pcp Not In System

## 2015-07-11 NOTE — Progress Notes (Signed)
Initial appointment made at the [facility] on July 24, 2015 at 11:30am. Thank you.

## 2015-07-11 NOTE — Progress Notes (Signed)
Patient is still having frequent PVCs, Dr. Jannifer Franklin notified with a new order for Calcium gluconate 1 gm IV x 1 dose. Patient is also complaining of leg crump, Dr. Jannifer Franklin  notified but no new order received. Patient was offered some mustards to eat to relief his leg crump and he verbalized relief after that.  Dr. Jannifer Franklin is aware of the 2nd Troponin 0.26, no new  Order received.  Patient was complaining of SOB , 02 at 1 L applied for comfort. Will continue to assess.

## 2015-07-11 NOTE — Progress Notes (Signed)
Ativan 0.5mg po given for complaints of anxiety.

## 2015-07-12 ENCOUNTER — Inpatient Hospital Stay
Admit: 2015-07-12 | Discharge: 2015-07-12 | Disposition: A | Discharge status: Home / Self Care | Payer: Medicare Other | Attending: Internal Medicine | Admitting: Internal Medicine

## 2015-07-12 DIAGNOSIS — I110 Hypertensive heart disease with heart failure: Secondary | ICD-10-CM | POA: Diagnosis not present

## 2015-07-12 LAB — MAGNESIUM: MAGNESIUM: 1.8 mg/dL (ref 1.7–2.4)

## 2015-07-12 LAB — BASIC METABOLIC PANEL
Anion gap: 12 (ref 5–15)
BUN: 26 mg/dL — AB (ref 6–20)
CHLORIDE: 98 mmol/L — AB (ref 101–111)
CO2: 24 mmol/L (ref 22–32)
CREATININE: 1.19 mg/dL (ref 0.61–1.24)
Calcium: 9.6 mg/dL (ref 8.9–10.3)
GFR calc Af Amer: >60 mL/min (ref >60)
GFR calc non Af Amer: 59 mL/min — ABNORMAL LOW (ref >60)
GLUCOSE: 209 mg/dL — AB (ref 65–99)
Potassium: 4 mmol/L (ref 3.5–5.1)
SODIUM: 134 mmol/L — AB (ref 135–145)

## 2015-07-12 LAB — ECHOCARDIOGRAM COMPLETE
Height: 68 in
WEIGHTICAEL: 3412.8 [oz_av]

## 2015-07-12 MED ORDER — FUROSEMIDE 20 MG PO TABS
20.0000 mg | ORAL_TABLET | Freq: Every day | ORAL | Status: DC
Start: 2015-07-12 — End: 2018-08-25

## 2015-07-12 MED ORDER — POTASSIUM CHLORIDE CRYS ER 20 MEQ PO TBCR: 20.0000 meq | EXTENDED_RELEASE_TABLET | Freq: Every day | ORAL | Status: DC

## 2015-07-12 MED ORDER — PNEUMOCOCCAL VAC POLYVALENT 25 MCG/0.5ML IJ INJ
0.5000 mL | INJECTION | INTRAMUSCULAR | Status: AC
  Administered 2015-07-12: 0.5 mL via INTRAMUSCULAR

## 2015-07-12 MED ORDER — FUROSEMIDE 20 MG PO TABS
20.0000 mg | ORAL_TABLET | Freq: Every day | ORAL | Status: DC
Start: 2015-07-12 — End: 2015-07-12

## 2015-07-12 NOTE — Progress Notes (Signed)
Pt to be discharged today. Iv and tele removed. disch instructions plus prescrips and pneumovax given to pt. disch via w.c.to home accompanied by spouse

## 2015-07-12 NOTE — Progress Notes (Signed)
*  PRELIMINARY RESULTS* Echocardiogram 2D Echocardiogram has been performed.  Gary Harmon 07/12/2015, 8:31 AM

## 2015-07-12 NOTE — Discharge Summary (Signed)
[facility] NAME: Gary Harmon    MR#:  [id number]  DATE OF BIRTH:  08/14/1940  DATE OF ADMISSION:  07/10/2015 ADMITTING PHYSICIAN: Dustin Flock, MD  DATE OF DISCHARGE: No discharge date for patient encounter.  PRIMARY CARE PHYSICIAN: Pcp Not In System    ADMISSION DIAGNOSIS:  Dyspnea [R06.00] Elevated troponin I level [R79.89] Acute congestive heart failure, unspecified congestive heart failure type (Fleming-Neon) [I50.9]  DISCHARGE DIAGNOSIS:  Acute diastolic congestive heart failure  SECONDARY DIAGNOSIS:   Past Medical History  Diagnosis Date  . Hypertension   . Gout   . Rheumatic fever     HOSPITAL COURSE:  Gary Harmon  is a 75 y.o. male admitted 07/10/2015 with chief complaint shortness of breath. Please see HP performed by Dr Posey Pronto for further information. Patient originally presented to the hospital with evidence of congestive heart failure he required IV diuresis during his stay. He was also originally on supplemental oxygen. Fortunately he had significant improvement during his stay and his and voiding without difficulty on day of discharge  DISCHARGE CONDITIONS:   Stable/improved  CONSULTS OBTAINED:  Treatment Team:  Yolonda Kida, MD  DRUG ALLERGIES:  No Known Allergies  DISCHARGE MEDICATIONS:   Current Discharge Medication List    START taking these medications   Details  furosemide (LASIX) 20 MG tablet Take 1 tablet (20 mg total) by mouth daily. Qty: 30 tablet, Refills: 0    potassium chloride SA (K-DUR,KLOR-CON) 20 MEQ tablet Take 1 tablet (20 mEq total) by mouth daily. Qty: 30 tablet, Refills: 0      CONTINUE these medications which have NOT CHANGED   Details  acetaminophen (TYLENOL) 325 MG tablet Take 650 mg by mouth every 6 (six) hours as needed.    allopurinol (ZYLOPRIM) 100 MG tablet Take 1 tablet (100 mg total) by mouth 2 (two) times daily. Qty: 90 tablet, Refills: 6    aspirin EC  81 MG tablet Take 81 mg by mouth daily.    benazepril (LOTENSIN) 20 MG tablet Take 20 mg by mouth daily.    metoprolol succinate (TOPROL-XL) 100 MG 24 hr tablet Take 100 mg by mouth daily.      STOP taking these medications     hydrochlorothiazide (HYDRODIURIL) 12.5 MG tablet          DISCHARGE INSTRUCTIONS:    DIET:  Cardiac diet  DISCHARGE CONDITION:  Good  ACTIVITY:  Activity as tolerated  OXYGEN:  Home Oxygen: No.   Oxygen Delivery: room air  DISCHARGE LOCATION:  home   If you experience worsening of your admission symptoms, develop shortness of breath, life threatening emergency, suicidal or homicidal thoughts you must seek medical attention immediately by calling 911 or calling your MD immediately  if symptoms less severe.  You Must read complete instructions/literature along with all the possible adverse reactions/side effects for all the Medicines you take and that have been prescribed to you. Take any new Medicines after you have completely understood and accpet all the possible adverse reactions/side effects.   Please note  You were cared for by a hospitalist during your hospital stay. If you have any questions about your discharge medications or the care you received while you were in the hospital after you are discharged, you can call the unit and asked to speak with the hospitalist on call if the hospitalist that took care of you is not available. Once you are discharged, your primary care physician  will handle any further medical issues. Please note that NO REFILLS for any discharge medications will be authorized once you are discharged, as it is imperative that you return to your primary care physician (or establish a relationship with a primary care physician if you do not have one) for your aftercare needs so that they can reassess your need for medications and monitor your lab values.    On the day of Discharge:   VITAL SIGNS:  Blood pressure 123/88,  pulse 107, temperature 98.9 F (37.2 C), temperature source Oral, resp. rate 20, height 5\' 8"  (1.727 m), weight 96.752 kg (213 lb 4.8 oz), SpO2 95 %.  I/O:   Intake/Output Summary (Last 24 hours) at 07/12/15 1008 Last data filed at 07/12/15 0428  Gross per 24 hour  Intake    460 ml  Output   3350 ml  Net  -2890 ml    PHYSICAL EXAMINATION:  GENERAL:  75 y.o.-year-old patient lying in the bed with no acute distress.  EYES: Pupils equal, round, reactive to light and accommodation. No scleral icterus. Extraocular muscles intact.  HEENT: Head atraumatic, normocephalic. Oropharynx and nasopharynx clear.  NECK:  Supple, no jugular venous distention. No thyroid enlargement, no tenderness.  LUNGS: Normal breath sounds bilaterally, no wheezing, rales,rhonchi or crepitation. No use of accessory muscles of respiration.  CARDIOVASCULAR: S1, S2 normal. No murmurs, rubs, or gallops.  ABDOMEN: Soft, non-tender, non-distended. Bowel sounds present. No organomegaly or mass.  EXTREMITIES: No pedal edema, cyanosis, or clubbing.  NEUROLOGIC: Cranial nerves II through XII are intact. Muscle strength 5/5 in all extremities. Sensation intact. Gait not checked.  PSYCHIATRIC: The patient is alert and oriented x 3.  SKIN: No obvious rash, lesion, or ulcer.   DATA REVIEW:   CBC  Recent Labs Lab 07/11/15 0126  WBC 10.4  HGB 12.8*  HCT 36.9*  PLT 223    Chemistries   Recent Labs Lab 07/11/15 0126 07/11/15 0644  NA 134*  --   K 3.2*  --   CL 101  --   CO2 25  --   GLUCOSE 102*  --   BUN 23*  --   CREATININE 0.98  --   CALCIUM 8.8*  --   MG  --  1.3*    Cardiac Enzymes  Recent Labs Lab 07/11/15 0644  TROPONINI 0.22*    Microbiology Results  No results found for this or any previous visit.  RADIOLOGY:  Dg Chest 2 View  07/10/2015  CLINICAL DATA:  75 year old male with shortness of breath and cough EXAM: CHEST  2 VIEW COMPARISON:  None. FINDINGS: Borderline cardiomegaly. Very low  inspiratory volumes with bibasilar atelectasis. Pulmonary vascular congestion without overt edema. Suspect small right pleural effusion. No pneumothorax. No suspicious nodule or mass. No acute osseous abnormality. Small fluid tracks along the minor fissure. IMPRESSION: 1. Very low inspiratory volumes with bibasilar atelectasis. 2. Cardiomegaly and pulmonary vascular congestion without overt edema. 3. Small right pleural effusion. Electronically Signed   By: Jacqulynn Cadet M.D.   On: 07/10/2015 13:16     Management plans discussed with the patient, family and they are in agreement.  CODE STATUS:     Code Status Orders        Start     Ordered   07/10/15 1902  Full code   Continuous     07/10/15 1901    Code Status History    Date Active Date Inactive Code Status Order ID Comments User Context  This patient has a current code status but no historical code status.    Advance Directive Documentation        Most Recent Value   Type of Advance Directive  Healthcare Power of Attorney, Living will   Pre-existing out of facility DNR order (yellow form or pink MOST form)     "MOST" Form in Place?        TOTAL TIME TAKING CARE OF THIS PATIENT: 28 minutes.    Hower,  Karenann Cai.D on 07/12/2015 at 10:08 AM  Between 7am to 6pm - Pager - [phone]  After 6pm go to [email or web address] - password [email or web address] [facility]  Office  [phone]  CC: Primary care physician; Pcp Not In System

## 2015-07-24 ENCOUNTER — Ambulatory Visit: Payer: Medicare PPO | Admitting: Family

## 2015-10-13 ENCOUNTER — Encounter
Admission: RE | Disposition: A | Discharge status: Home / Self Care | Payer: Self-pay | Source: Ambulatory Visit | Attending: Internal Medicine

## 2015-10-13 ENCOUNTER — Ambulatory Visit
Admission: RE | Admit: 2015-10-13 | Discharge: 2015-10-13 | Disposition: A | Discharge status: Home / Self Care | Payer: Medicare Other | Source: Ambulatory Visit | Attending: Internal Medicine | Admitting: Internal Medicine

## 2015-10-13 DIAGNOSIS — R0602 Shortness of breath: Secondary | ICD-10-CM | POA: Diagnosis present

## 2015-10-13 DIAGNOSIS — F419 Anxiety disorder, unspecified: Secondary | ICD-10-CM | POA: Insufficient documentation

## 2015-10-13 DIAGNOSIS — Z79899 Other long term (current) drug therapy: Secondary | ICD-10-CM | POA: Diagnosis not present

## 2015-10-13 DIAGNOSIS — R5383 Other fatigue: Secondary | ICD-10-CM | POA: Insufficient documentation

## 2015-10-13 DIAGNOSIS — E669 Obesity, unspecified: Secondary | ICD-10-CM | POA: Diagnosis not present

## 2015-10-13 DIAGNOSIS — I5021 Acute systolic (congestive) heart failure: Secondary | ICD-10-CM | POA: Insufficient documentation

## 2015-10-13 DIAGNOSIS — I429 Cardiomyopathy, unspecified: Secondary | ICD-10-CM | POA: Diagnosis not present

## 2015-10-13 DIAGNOSIS — R0601 Orthopnea: Secondary | ICD-10-CM | POA: Diagnosis not present

## 2015-10-13 DIAGNOSIS — I272 Other secondary pulmonary hypertension: Secondary | ICD-10-CM | POA: Insufficient documentation

## 2015-10-13 DIAGNOSIS — R42 Dizziness and giddiness: Secondary | ICD-10-CM | POA: Diagnosis not present

## 2015-10-13 DIAGNOSIS — I2510 Atherosclerotic heart disease of native coronary artery without angina pectoris: Secondary | ICD-10-CM | POA: Insufficient documentation

## 2015-10-13 DIAGNOSIS — Z8249 Family history of ischemic heart disease and other diseases of the circulatory system: Secondary | ICD-10-CM | POA: Insufficient documentation

## 2015-10-13 DIAGNOSIS — I110 Hypertensive heart disease with heart failure: Secondary | ICD-10-CM | POA: Insufficient documentation

## 2015-10-13 DIAGNOSIS — Z7982 Long term (current) use of aspirin: Secondary | ICD-10-CM | POA: Insufficient documentation

## 2015-10-13 DIAGNOSIS — Z803 Family history of malignant neoplasm of breast: Secondary | ICD-10-CM | POA: Insufficient documentation

## 2015-10-13 DIAGNOSIS — Z6833 Body mass index (BMI) 33.0-33.9, adult: Secondary | ICD-10-CM | POA: Insufficient documentation

## 2015-10-13 HISTORY — PX: CARDIAC CATHETERIZATION: SHX172

## 2015-10-13 SURGERY — RIGHT/LEFT HEART CATH AND CORONARY ANGIOGRAPHY
Anesthesia: Moderate Sedation | Laterality: Right

## 2015-10-13 SURGERY — RIGHT/LEFT HEART CATH AND CORONARY ANGIOGRAPHY
Anesthesia: Moderate Sedation

## 2015-10-13 MED ORDER — SODIUM CHLORIDE 0.9 % IV SOLN: 250.0000 mL | INTRAVENOUS | Status: DC | PRN

## 2015-10-13 MED ORDER — SODIUM CHLORIDE 0.9 % IV SOLN
INTRAVENOUS | Status: DC
  Administered 2015-10-13: 12:00:00 via INTRAVENOUS

## 2015-10-13 MED ORDER — MIDAZOLAM HCL 2 MG/2ML IJ SOLN
INTRAMUSCULAR | Status: AC
  Filled 2015-10-13: qty 2

## 2015-10-13 MED ORDER — MIDAZOLAM HCL 2 MG/2ML IJ SOLN
INTRAMUSCULAR | Status: DC | PRN
  Administered 2015-10-13 (×2): 1 mg via INTRAVENOUS

## 2015-10-13 MED ORDER — FENTANYL CITRATE (PF) 100 MCG/2ML IJ SOLN
INTRAMUSCULAR | Status: DC | PRN
  Administered 2015-10-13 (×2): 25 ug via INTRAVENOUS

## 2015-10-13 MED ORDER — SODIUM CHLORIDE 0.9% FLUSH: 3.0000 mL | INTRAVENOUS | Status: DC | PRN

## 2015-10-13 MED ORDER — SODIUM CHLORIDE 0.9 % WEIGHT BASED INFUSION: 3.0000 mL/kg/h | INTRAVENOUS | Status: DC

## 2015-10-13 MED ORDER — FENTANYL CITRATE (PF) 100 MCG/2ML IJ SOLN
INTRAMUSCULAR | Status: AC
  Filled 2015-10-13: qty 2

## 2015-10-13 MED ORDER — ONDANSETRON HCL 4 MG/2ML IJ SOLN: 4.0000 mg | Freq: Four times a day (QID) | INTRAMUSCULAR | Status: DC | PRN

## 2015-10-13 MED ORDER — SODIUM CHLORIDE 0.9% FLUSH: 3.0000 mL | Freq: Two times a day (BID) | INTRAVENOUS | Status: DC

## 2015-10-13 MED ORDER — SODIUM CHLORIDE 0.9 % WEIGHT BASED INFUSION: 1.0000 mL/kg/h | INTRAVENOUS | Status: DC

## 2015-10-13 MED ORDER — HEPARIN (PORCINE) IN NACL 2-0.9 UNIT/ML-% IJ SOLN
INTRAMUSCULAR | Status: AC
  Filled 2015-10-13: qty 500

## 2015-10-13 MED ORDER — ACETAMINOPHEN 325 MG PO TABS: 650.0000 mg | ORAL_TABLET | ORAL | Status: DC | PRN

## 2015-10-13 MED ORDER — ASPIRIN 81 MG PO CHEW: 81.0000 mg | CHEWABLE_TABLET | ORAL | Status: DC

## 2015-10-13 MED ORDER — IOPAMIDOL (ISOVUE-300) INJECTION 61%
INTRAVENOUS | Status: DC | PRN
  Administered 2015-10-13: 130 mL via INTRA_ARTERIAL

## 2015-10-13 SURGICAL SUPPLY — 13 items
CATH INFINITI 5FR ANG PIGTAIL (CATHETERS) ×3 IMPLANT
CATH INFINITI 5FR JL4 (CATHETERS) ×3 IMPLANT
CATH INFINITI JR4 5F (CATHETERS) ×3 IMPLANT
CATH SWANZ 7F THERMO (CATHETERS) ×3 IMPLANT
DEVICE CLOSURE MYNXGRIP 5F (Vascular Products) ×3 IMPLANT
GUIDEWIRE EMER 3M J .025X150CM (WIRE) ×3 IMPLANT
KIT MANI 3VAL PERCEP (MISCELLANEOUS) ×3 IMPLANT
KIT RIGHT HEART (MISCELLANEOUS) ×3 IMPLANT
NEEDLE PERC 18GX7CM (NEEDLE) ×3 IMPLANT
PACK CARDIAC CATH (CUSTOM PROCEDURE TRAY) ×3 IMPLANT
SHEATH AVANTI 5FR X 11CM (SHEATH) ×3 IMPLANT
SHEATH PINNACLE 7F 10CM (SHEATH) ×3 IMPLANT
WIRE EMERALD 3MM-J .035X150CM (WIRE) ×3 IMPLANT

## 2015-10-13 NOTE — Discharge Instructions (Signed)
Medication changes reviewed with wife, patient and Dr Clayborn Bigness prior to discharge: Decrease the stop Furosemide, and increase benazepril to twice a day ( or 40 mg every am)

## 2015-10-16 ENCOUNTER — Encounter: Payer: Self-pay | Admitting: Internal Medicine

## 2016-02-12 ENCOUNTER — Telehealth: Payer: Self-pay

## 2016-02-12 NOTE — Telephone Encounter (Signed)
I contacted Mr. Holness per Dr. Marianna Payment' referral. I explained to Ms. Grigg what we do here at the clinic and why patients come here. She stated that Mr. Todisco has had Heart Failure for some time and that they do not need education on it , they know everything they need to know. She also doesn't believe she needs to come see someone to monitor his weight and blood pressure as she can do that at home for free.  She expressed a need for counseling or a support group to help Mr. Groh deal with this diagnosis and his feelings. Somewhere where he can share stories and fears. I explained that we do not offer those things at our clinic.   She declined making an appointment at this time and has also cancelled an appointment with [place] on 07/24/2015.

## 2016-02-14 ENCOUNTER — Ambulatory Visit (INDEPENDENT_AMBULATORY_CARE_PROVIDER_SITE_OTHER): Payer: Medicare Other | Admitting: Psychology

## 2016-02-14 ENCOUNTER — Encounter (HOSPITAL_COMMUNITY): Payer: Self-pay | Admitting: Psychology

## 2016-02-14 DIAGNOSIS — F411 Generalized anxiety disorder: Secondary | ICD-10-CM | POA: Diagnosis not present

## 2016-02-14 NOTE — Progress Notes (Signed)
Patient:   Gary Harmon   DOB:   06/19/1940  MR Number:  [id number]  Location:  [facility] [address] [place] [address] Dept: [phone]           Date of Service:   02/14/2016  Start Time:   11 AM End Time:   12 PM  Provider/Observer:  Edgardo Roys PSYD       Billing Code/Service: [phone]  Chief Complaint:     Chief Complaint  Patient presents with  . Depression  . Stress    Reason for Service:  The patient was self-referred/referred by his son because of ongoing difficulties coping with a recent diagnosis of congestive heart failure. The patient is a 75 year old Caucasian male who lives with his wife in [place]. The patient describes issues related to anxiety, changes in appetite, sleep disturbance, and loss of interest. In his wife report that the patient was diagnosed with congestive heart failure in February 2017. He has been estimated to have a 38% loss of cardiovascular function. He has had no heart attacks and has been evaluated but has no arterial blockages found through catheterization studies. The patient reports that he started getting winded and feeling tired after playing 18 holes of golf which was a common for him. He reports that after that that he would feel "wiped out for the rest of the day." He describes shortness of breath and fatigue. He went to see a cardiologist and they performed a catheterization study there were no blockages or leaks or any other issues found besides reduce cardiovascular functioning/congestive heart failure. The patient's current blood pressure and heart rate are all good. He is being followed by Dr. Gwendolyn Lima, MD through [facility].  The patient's wife reports that he has appeared to of lost interest in doing anything the things that he did before. The patient has never been sick beyond the flu and was always a very outgoing and even aggressive individual  and engaging in life activities. He is always had good follow-through. They both report that he is now almost afraid of life. The patient does not want to do anything unless someone is with him and he is very apprehensive about being away on his on an almost any activities. The patient's wife reports that he does not want to be challenged by doing anything that is not seen.   Current Status:  The patient is described as having increased symptoms of anxiety, sleep disturbance, appetite changes, and loss of interest.  Reliability of Information: Information is provided by the patient, his wife, his son, and review of available medical records.  Behavioral Observation: Gary Harmon  presents as a 75 y.o.-year-old Right Caucasian Male who appeared his stated age. his dress was Appropriate and he was Well Groomed and his manners were Appropriate to the situation.  There were not any physical disabilities noted.  he displayed an appropriate level of cooperation and motivation.      Interactions:    Active   Attention:   within normal limits  Memory:   within normal limits  Visuo-spatial:   within normal limits  Speech (Volume):  normal  Speech:   normal pitch  Thought Process:  Coherent  Though Content:  WNL  Orientation:   person, place, time/date and situation  Judgment:   Good  Planning:   Good  Affect:    Anxious  Mood:    Anxious  Insight:   Good  Intelligence:   high  Marital Status/Living: The patient was born in [place] and grew up in [place]. The patient has a 73 year old and a 55 year old son. He lives with his wife.  Current Employment: The patient is retired.  Substance Use:  No concerns of substance abuse are reported.  There is no history of alcohol or substance abuse.  Education:   Engineering geologist History:   Past Medical History:  Diagnosis Date  . Gout   . Hypertension   . Rheumatic fever         Outpatient  Encounter Prescriptions as of 02/14/2016  Medication Sig  . acetaminophen (TYLENOL) 325 MG tablet Take 650 mg by mouth every 6 (six) hours as needed for mild pain.   Marland Kitchen allopurinol (ZYLOPRIM) 100 MG tablet Take 1 tablet (100 mg total) by mouth 2 (two) times daily. (Patient taking differently: Take 100 mg by mouth daily. )  . aspirin EC 81 MG tablet Take 81 mg by mouth daily.  . benazepril (LOTENSIN) 20 MG tablet Take 20 mg by mouth daily.  . furosemide (LASIX) 20 MG tablet Take 1 tablet (20 mg total) by mouth daily. (Patient not taking: Reported on 10/11/2015)  . metoprolol succinate (TOPROL-XL) 100 MG 24 hr tablet Take 100 mg by mouth daily.  . potassium chloride SA (K-DUR,KLOR-CON) 20 MEQ tablet Take 1 tablet (20 mEq total) by mouth daily.  Marland Kitchen torsemide (DEMADEX) 20 MG tablet Take 20 mg by mouth daily.   No facility-administered encounter medications on file as of 02/14/2016.           Sexual History:   History  Sexual Activity  . Sexual activity: Not on file    Abuse/Trauma History: The patient denies any history of abuse or trauma.  Psychiatric History:  The patient has no psychiatric history.  Family Med/Psych History:  Family History  Problem Relation Age of Onset  . Hypertension      Risk of Suicide/Violence: virtually non-existent the patient denies any suicidal or homicidal ideation  Impression/DX:  The patient began having fatigue and stamina issues as well as shortness of breath and was seen by a cardiologist. They did extensive cardiovascular studies and found nothing acutely wrong and the patient has had no heart attacks significant blockages. However, there was a significant loss and overall cardiovascular functioning and he was diagnosed with congestive heart failure with a 38% loss of heart functioning. The patient was diagnosed with this in November of this year. The patient and his wife both report that he has become increasingly apprehensive about engaging in many life  activities where he does not have significant for the rest. He has not been playing golf or doing other normal activities for him and is been very hard for him to be out of his comfort zone. I do think that this is created a significant existential emotional response and his adaptation to that was to avoid almost any activity. He has been doing only minimal exercise at physical activity and not engaging in life actively. I do think that he has developed an anxiety response to his medical issue. However, this response is counterproductive for the diagnosis he has and he and his family all realize that there needs to be a change in his response to this diagnosis.  Disposition/Plan:  We will set the patient up for individual psychotherapeutic interventions.  Diagnosis:    Axis I:  Generalized anxiety disorder      Electronically  Signed   _______________________ Ilean Skill, Psy.D.  02/14/2016

## 2016-02-27 ENCOUNTER — Ambulatory Visit (INDEPENDENT_AMBULATORY_CARE_PROVIDER_SITE_OTHER): Payer: Medicare Other | Admitting: Psychology

## 2016-02-27 ENCOUNTER — Ambulatory Visit (HOSPITAL_COMMUNITY): Payer: Self-pay | Admitting: Psychology

## 2016-02-27 DIAGNOSIS — F411 Generalized anxiety disorder: Secondary | ICD-10-CM | POA: Diagnosis not present

## 2016-03-01 ENCOUNTER — Encounter (HOSPITAL_COMMUNITY): Payer: Self-pay | Admitting: Psychology

## 2016-03-01 NOTE — Progress Notes (Signed)
Patient:   Gary Harmon   DOB:   02-Feb-1941  MR Number:  [id number]  Location:  [facility] [address] [place] [address] Dept: [phone]           Date of Service:   02/14/2016  Start Time:   11 AM End Time:   12 PM  Provider/Observer:  Edgardo Roys PSYD       Billing Code/Service: [phone]  Chief Complaint:     Chief Complaint  Patient presents with  . Stress    Reason for Service:  The patient was self-referred/referred by his son because of ongoing difficulties coping with a recent diagnosis of congestive heart failure. The patient is a 75 year old Caucasian male who lives with his wife in [place]. The patient describes issues related to anxiety, changes in appetite, sleep disturbance, and loss of interest. In his wife report that the patient was diagnosed with congestive heart failure in February 2017. He has been estimated to have a 38% loss of cardiovascular function. He has had no heart attacks and has been evaluated but has no arterial blockages found through catheterization studies. The patient reports that he started getting winded and feeling tired after playing 18 holes of golf which was a common for him. He reports that after that that he would feel "wiped out for the rest of the day." He describes shortness of breath and fatigue. He went to see a cardiologist and they performed a catheterization study there were no blockages or leaks or any other issues found besides reduce cardiovascular functioning/congestive heart failure. The patient's current blood pressure and heart rate are all good. He is being followed by Dr. Gwendolyn Lima, MD through [facility].  The patient's wife reports that he has appeared to of lost interest in doing anything the things that he did before. The patient has never been sick beyond the flu and was always a very outgoing and even aggressive individual and engaging  in life activities. He is always had good follow-through. They both report that he is now almost afraid of life. The patient does not want to do anything unless someone is with him and he is very apprehensive about being away on his on an almost any activities. The patient's wife reports that he does not want to be challenged by doing anything that is not seen.   Current Status:  The patient is described as having increased symptoms of anxiety, sleep disturbance, appetite changes, and loss of interest.  Reliability of Information: Information is provided by the patient, his wife, his son, and review of available medical records.  Behavioral Observation: Gary Harmon  presents as a 75 y.o.-year-old Right Caucasian Male who appeared his stated age. his dress was Appropriate and he was Well Groomed and his manners were Appropriate to the situation.  There were not any physical disabilities noted.  he displayed an appropriate level of cooperation and motivation.      Interactions:    Active   Attention:   within normal limits  Memory:   within normal limits  Visuo-spatial:   within normal limits  Speech (Volume):  normal  Speech:   normal pitch  Thought Process:  Coherent  Though Content:  WNL  Orientation:   person, place, time/date and situation  Judgment:   Good  Planning:   Good  Affect:    Anxious  Mood:    Anxious  Insight:   Good  Intelligence:  high  Marital Status/Living: The patient was born in [place] and grew up in [place]. The patient has a 73 year old and a 8 year old son. He lives with his wife.  Current Employment: The patient is retired.  Substance Use:  No concerns of substance abuse are reported.  There is no history of alcohol or substance abuse.  Education:   Engineering geologist History:   Past Medical History:  Diagnosis Date  . Gout   . Hypertension   . Rheumatic fever         Outpatient Encounter  Prescriptions as of 02/27/2016  Medication Sig  . acetaminophen (TYLENOL) 325 MG tablet Take 650 mg by mouth every 6 (six) hours as needed for mild pain.   Marland Kitchen allopurinol (ZYLOPRIM) 100 MG tablet Take 1 tablet (100 mg total) by mouth 2 (two) times daily. (Patient taking differently: Take 100 mg by mouth daily. )  . aspirin EC 81 MG tablet Take 81 mg by mouth daily.  . benazepril (LOTENSIN) 20 MG tablet Take 20 mg by mouth daily.  . furosemide (LASIX) 20 MG tablet Take 1 tablet (20 mg total) by mouth daily. (Patient not taking: Reported on 10/11/2015)  . metoprolol succinate (TOPROL-XL) 100 MG 24 hr tablet Take 100 mg by mouth daily.  . potassium chloride SA (K-DUR,KLOR-CON) 20 MEQ tablet Take 1 tablet (20 mEq total) by mouth daily.  Marland Kitchen torsemide (DEMADEX) 20 MG tablet Take 20 mg by mouth daily.   No facility-administered encounter medications on file as of 02/27/2016.           Sexual History:   History  Sexual Activity  . Sexual activity: Not on file    Abuse/Trauma History: The patient denies any history of abuse or trauma.  Psychiatric History:  The patient has no psychiatric history.  Family Med/Psych History:  Family History  Problem Relation Age of Onset  . Hypertension      Risk of Suicide/Violence: virtually non-existent the patient denies any suicidal or homicidal ideation  Impression/DX:  The patient began having fatigue and stamina issues as well as shortness of breath and was seen by a cardiologist. They did extensive cardiovascular studies and found nothing acutely wrong and the patient has had no heart attacks significant blockages. However, there was a significant loss and overall cardiovascular functioning and he was diagnosed with congestive heart failure with a 38% loss of heart functioning. The patient was diagnosed with this in November of this year. The patient and his wife both report that he has become increasingly apprehensive about engaging in many life  activities where he does not have significant for the rest. He has not been playing golf or doing other normal activities for him and is been very hard for him to be out of his comfort zone. I do think that this is created a significant existential emotional response and his adaptation to that was to avoid almost any activity. He has been doing only minimal exercise at physical activity and not engaging in life actively. I do think that he has developed an anxiety response to his medical issue. However, this response is counterproductive for the diagnosis he has and he and his family all realize that there needs to be a change in his response to this diagnosis.  Disposition/Plan:  We will set the patient up for individual psychotherapeutic interventions.  Diagnosis:    Axis I:  Generalized anxiety disorder      Electronically Signed  _______________________ Ilean Skill, Psy.D.  02/14/2016

## 2016-03-19 ENCOUNTER — Ambulatory Visit (INDEPENDENT_AMBULATORY_CARE_PROVIDER_SITE_OTHER): Payer: Medicare Other | Admitting: Psychology

## 2016-03-19 DIAGNOSIS — F411 Generalized anxiety disorder: Secondary | ICD-10-CM

## 2016-04-26 NOTE — Progress Notes (Signed)
The patient has been doing much better.  He has improved activity and has had significant reduction in anxiety and worry about cardiac functioning.

## 2018-06-22 ENCOUNTER — Emergency Department
Admission: EM | Admit: 2018-06-22 | Discharge: 2018-06-22 | Disposition: A | Discharge status: Home / Self Care | Payer: Medicare Other | Attending: Emergency Medicine | Admitting: Emergency Medicine

## 2018-06-22 ENCOUNTER — Encounter: Payer: Self-pay | Admitting: Emergency Medicine

## 2018-06-22 ENCOUNTER — Other Ambulatory Visit: Payer: Self-pay

## 2018-06-22 DIAGNOSIS — Z7982 Long term (current) use of aspirin: Secondary | ICD-10-CM | POA: Diagnosis not present

## 2018-06-22 DIAGNOSIS — Z79899 Other long term (current) drug therapy: Secondary | ICD-10-CM | POA: Diagnosis not present

## 2018-06-22 DIAGNOSIS — I10 Essential (primary) hypertension: Secondary | ICD-10-CM | POA: Insufficient documentation

## 2018-06-22 DIAGNOSIS — E875 Hyperkalemia: Secondary | ICD-10-CM | POA: Insufficient documentation

## 2018-06-22 LAB — CBC
HCT: 32.6 % — ABNORMAL LOW (ref 39.0–52.0)
Hemoglobin: 10.6 g/dL — ABNORMAL LOW (ref 13.0–17.0)
MCH: 30.4 pg (ref 26.0–34.0)
MCHC: 32.5 g/dL (ref 30.0–36.0)
MCV: 93.4 fL (ref 80.0–100.0)
Platelets: 273 10*3/uL (ref 150–400)
RBC: 3.49 MIL/uL — ABNORMAL LOW (ref 4.22–5.81)
RDW: 14.1 % (ref 11.5–15.5)
WBC: 9.4 10*3/uL (ref 4.0–10.5)
nRBC: 0 % (ref 0.0–0.2)

## 2018-06-22 LAB — COMPREHENSIVE METABOLIC PANEL
ALBUMIN: 4.3 g/dL (ref 3.5–5.0)
ALK PHOS: 78 U/L (ref 38–126)
ALT: 17 U/L (ref 0–44)
AST: 22 U/L (ref 15–41)
Anion gap: 9 (ref 5–15)
BILIRUBIN TOTAL: 0.6 mg/dL (ref 0.3–1.2)
BUN: 74 mg/dL — ABNORMAL HIGH (ref 8–23)
CALCIUM: 9.7 mg/dL (ref 8.9–10.3)
CO2: 18 mmol/L — ABNORMAL LOW (ref 22–32)
Chloride: 112 mmol/L — ABNORMAL HIGH (ref 98–111)
Creatinine, Ser: 2.45 mg/dL — ABNORMAL HIGH (ref 0.61–1.24)
GFR calc Af Amer: 29 mL/min — ABNORMAL LOW (ref >60)
GFR calc non Af Amer: 25 mL/min — ABNORMAL LOW (ref >60)
GLUCOSE: 104 mg/dL — AB (ref 70–99)
Potassium: 6.1 mmol/L — ABNORMAL HIGH (ref 3.5–5.1)
Sodium: 139 mmol/L (ref 135–145)
TOTAL PROTEIN: 7.6 g/dL (ref 6.5–8.1)

## 2018-06-22 MED ORDER — SODIUM POLYSTYRENE SULFONATE 15 GM/60ML PO SUSP
30.0000 g | Freq: Once | ORAL | Status: AC
  Administered 2018-06-22: 30 g via ORAL
  Filled 2018-06-22: qty 120

## 2018-06-22 NOTE — ED Triage Notes (Signed)
Pt states he had a physical today, was just called that his potassium is 6.9. Denies pain, appears in NAD. Pt laughing in triage.

## 2018-06-22 NOTE — ED Provider Notes (Signed)
[facility] Emergency Department Provider Note  ____________________________________________   I have reviewed the triage vital signs and the nursing notes.   HISTORY  Chief Complaint Elevated Potassium   History limited by: Not Limited   HPI Gary Harmon is a 78 y.o. male who presents to the emergency department today because of concerns for elevated potassium that was found on blood work done at primary care physician's office today.  The patient states that he has been in his normal state of health. Does take a potassium pill. Denies any chest pain or palpitations.   Per medical record review patient has a history of gout, hypertension.  Past Medical History:  Diagnosis Date  . Gout   . Hypertension   . Rheumatic fever     Patient Active Problem List   Diagnosis Date Noted  . Acute CHF ([place]) 07/10/2015    Past Surgical History:  Procedure Laterality Date  . CARDIAC CATHETERIZATION Right 10/13/2015   Procedure: Right/Left Heart Cath and Coronary Angiography;  Surgeon: Yolonda Kida, MD;  Location: [place] [facility];  Service: Cardiovascular;  Laterality: Right;  . CATARACT EXTRACTION W/PHACO Left 09/26/2014   Procedure: CATARACT EXTRACTION PHACO AND INTRAOCULAR LENS PLACEMENT (IOC);  Surgeon: Estill Cotta, MD;  Location: [facility] OR[facility];  Service: Ophthalmology;  Laterality: Left;  [place] 01:45   . HAMMER TOE SURGERY      Prior to Admission medications   Medication Sig Start Date End Date Taking? Authorizing Provider  acetaminophen (TYLENOL) 325 MG tablet Take 650 mg by mouth every 6 (six) hours as needed for mild pain.     [provider]  allopurinol (ZYLOPRIM) 100 MG tablet Take 1 tablet (100 mg total) by mouth 2 (two) times daily. Patient taking differently: Take 100 mg by mouth daily.  09/29/14   Hyatt, Max T, DPM  aspirin EC 81 MG tablet Take 81 mg by mouth daily.    [provider]  benazepril (LOTENSIN) 20 MG tablet Take  20 mg by mouth daily.    [provider]  furosemide (LASIX) 20 MG tablet Take 1 tablet (20 mg total) by mouth daily. Patient not taking: Reported on 10/11/2015 07/12/15   Hower, Aaron Mose, MD  metoprolol succinate (TOPROL-XL) 100 MG 24 hr tablet Take 100 mg by mouth daily.    [provider]  potassium chloride SA (K-DUR,KLOR-CON) 20 MEQ tablet Take 1 tablet (20 mEq total) by mouth daily. 07/12/15   Hower, Aaron Mose, MD  torsemide (DEMADEX) 20 MG tablet Take 20 mg by mouth daily.    [provider]    Allergies Patient has no known allergies.  Family History  Problem Relation Age of Onset  . Hypertension Other     Social History Social History   Tobacco Use  . Smoking status: Never Smoker  . Smokeless tobacco: Never Used  Substance Use Topics  . Alcohol use: No    Alcohol/week: 0.0 standard drinks  . Drug use: No    Review of Systems Constitutional: No fever/chills Eyes: No visual changes. ENT: No sore throat. Cardiovascular: Denies chest pain. Respiratory: Denies shortness of breath. Gastrointestinal: No abdominal pain.  No nausea, no vomiting.  No diarrhea.   Genitourinary: Negative for dysuria. Musculoskeletal: Negative for back pain. Skin: Negative for rash. Neurological: Negative for headaches, focal weakness or numbness.  ____________________________________________   PHYSICAL EXAM:  VITAL SIGNS: ED Triage Vitals  Enc Vitals Group     BP 06/22/18 1745 (!) 141/64  Pulse Rate 06/22/18 1745 84     Resp --      Temp 06/22/18 1745 97.8 F (36.6 C)     Temp Source 06/22/18 1745 Oral     SpO2 06/22/18 1745 100 %     Weight 06/22/18 1730 177 lb (80.3 kg)     Height 06/22/18 1730 5\' 7"  (1.702 m)     Head Circumference --      Peak Flow --      Pain Score 06/22/18 1730 0   Constitutional: Alert and oriented.  Eyes: Conjunctivae are normal.  ENT      Head: Normocephalic and atraumatic.      Nose: No congestion/rhinnorhea.       Mouth/Throat: Mucous membranes are moist.      Neck: No stridor. Hematological/Lymphatic/Immunilogical: No cervical lymphadenopathy. Cardiovascular: Normal rate, regular rhythm.  No murmurs, rubs, or gallops.  Respiratory: Normal respiratory effort without tachypnea nor retractions. Breath sounds are clear and equal bilaterally. No wheezes/rales/rhonchi. Gastrointestinal: Soft and non tender. No rebound. No guarding.  Genitourinary: Deferred Musculoskeletal: Normal range of motion in all extremities. No lower extremity edema. Neurologic:  Normal speech and language. No gross focal neurologic deficits are appreciated.  Skin:  Skin is warm, dry and intact. No rash noted. Psychiatric: Mood and affect are normal. Speech and behavior are normal. Patient exhibits appropriate insight and judgment.  ____________________________________________    LABS (pertinent positives/negatives)  CMP na 139, k 6.1, glu 104, cr 2.45 CBC wbc 9.4, hgb 10.6, plt 273  ____________________________________________   EKG  I, Nance Pear, attending physician, personally viewed and interpreted this EKG  EKG Time: 1927 Rate: 57 Rhythm: sinus bradycardia Axis: normal Intervals: qtc 424 QRS: low voltage ST changes: no st elevation Impression: abnormal ekg   ____________________________________________    RADIOLOGY  None  ____________________________________________   PROCEDURES  Procedures  ____________________________________________   INITIAL IMPRESSION / ASSESSMENT AND PLAN / ED COURSE  Pertinent labs & imaging results that were available during my care of the patient were reviewed by me and considered in my medical decision making (see chart for details).   Patient presented to the emergency department because of concern of hyperkalemia found on blood work done today at his annual exam.  Patient denies any symptoms potassium here 6.1.  EKG without any concerning changes.  Patient is  on potassium supplements.  Discussed with patient that the will give dose of Kayexalate and medication for potassium.  I discussed with Dr. Candiss Norse with nephrology who feels this is an appropriate plan.  Will have patient follow-up. ____________________________________________   FINAL CLINICAL IMPRESSION(S) / ED DIAGNOSES  Final diagnoses:  Hyperkalemia     Note: This dictation was prepared with Dragon dictation. Any transcriptional errors that result from this process are unintentional     Nance Pear, MD 06/22/18 2018

## 2018-06-22 NOTE — Discharge Instructions (Addendum)
Please seek medical attention for any high fevers, chest pain, shortness of breath, change in behavior, persistent vomiting, bloody stool or any other new or concerning symptoms.  

## 2018-07-28 ENCOUNTER — Emergency Department
Admission: EM | Admit: 2018-07-28 | Discharge: 2018-07-28 | Disposition: A | Discharge status: Home / Self Care | Payer: Medicare Other | Attending: Nurse Practitioner | Admitting: Nurse Practitioner

## 2018-07-28 ENCOUNTER — Other Ambulatory Visit: Payer: Self-pay

## 2018-07-28 ENCOUNTER — Emergency Department: Payer: Medicare Other

## 2018-07-28 DIAGNOSIS — E875 Hyperkalemia: Secondary | ICD-10-CM | POA: Diagnosis not present

## 2018-07-28 DIAGNOSIS — N179 Acute kidney failure, unspecified: Secondary | ICD-10-CM | POA: Insufficient documentation

## 2018-07-28 DIAGNOSIS — I509 Heart failure, unspecified: Secondary | ICD-10-CM | POA: Diagnosis not present

## 2018-07-28 DIAGNOSIS — I2510 Atherosclerotic heart disease of native coronary artery without angina pectoris: Secondary | ICD-10-CM | POA: Diagnosis not present

## 2018-07-28 DIAGNOSIS — I110 Hypertensive heart disease with heart failure: Secondary | ICD-10-CM | POA: Diagnosis not present

## 2018-07-28 DIAGNOSIS — R42 Dizziness and giddiness: Secondary | ICD-10-CM | POA: Diagnosis present

## 2018-07-28 LAB — CBC
HCT: 34.5 % — ABNORMAL LOW (ref 39.0–52.0)
Hemoglobin: 11.3 g/dL — ABNORMAL LOW (ref 13.0–17.0)
MCH: 30.5 pg (ref 26.0–34.0)
MCHC: 32.8 g/dL (ref 30.0–36.0)
MCV: 93 fL (ref 80.0–100.0)
Platelets: 286 10*3/uL (ref 150–400)
RBC: 3.71 MIL/uL — ABNORMAL LOW (ref 4.22–5.81)
RDW: 13.2 % (ref 11.5–15.5)
WBC: 12.7 10*3/uL — ABNORMAL HIGH (ref 4.0–10.5)
nRBC: 0 % (ref 0.0–0.2)

## 2018-07-28 LAB — COMPREHENSIVE METABOLIC PANEL
ALT: 14 U/L (ref 0–44)
AST: 21 U/L (ref 15–41)
Albumin: 4.2 g/dL (ref 3.5–5.0)
Alkaline Phosphatase: 89 U/L (ref 38–126)
Anion gap: 11 (ref 5–15)
BUN: 85 mg/dL — ABNORMAL HIGH (ref 8–23)
CO2: 20 mmol/L — ABNORMAL LOW (ref 22–32)
Calcium: 9.8 mg/dL (ref 8.9–10.3)
Chloride: 106 mmol/L (ref 98–111)
Creatinine, Ser: 2.44 mg/dL — ABNORMAL HIGH (ref 0.61–1.24)
GFR calc Af Amer: 29 mL/min — ABNORMAL LOW (ref >60)
GFR calc non Af Amer: 25 mL/min — ABNORMAL LOW (ref >60)
Glucose, Bld: 129 mg/dL — ABNORMAL HIGH (ref 70–99)
Potassium: 6.1 mmol/L — ABNORMAL HIGH (ref 3.5–5.1)
Sodium: 137 mmol/L (ref 135–145)
Total Bilirubin: 0.6 mg/dL (ref 0.3–1.2)
Total Protein: 7.9 g/dL (ref 6.5–8.1)

## 2018-07-28 MED ORDER — MECLIZINE HCL 25 MG PO TABS
25.0000 mg | ORAL_TABLET | Freq: Once | ORAL | Status: AC
  Administered 2018-07-28: 15:00:00 25 mg via ORAL
  Filled 2018-07-28: qty 1

## 2018-07-28 MED ORDER — MECLIZINE HCL 25 MG PO TABS: 25.0000 mg | ORAL_TABLET | Freq: Three times a day (TID) | ORAL | 0 refills | Status: DC | PRN

## 2018-07-28 MED ORDER — SODIUM ZIRCONIUM CYCLOSILICATE 10 G PO PACK: 10.0000 g | PACK | Freq: Two times a day (BID) | ORAL | 0 refills | Status: DC

## 2018-07-28 MED ORDER — SODIUM ZIRCONIUM CYCLOSILICATE 10 G PO PACK
10.0000 g | PACK | Freq: Once | ORAL | Status: AC
  Administered 2018-07-28: 10 g via ORAL
  Filled 2018-07-28: qty 1

## 2018-07-28 MED ORDER — SODIUM CHLORIDE 0.9 % IV BOLUS
1000.0000 mL | Freq: Once | INTRAVENOUS | Status: AC
  Administered 2018-07-28: 15:00:00 1000 mL via INTRAVENOUS

## 2018-07-28 NOTE — ED Provider Notes (Signed)
[facility] Emergency Department Provider Note  ____________________________________________  Time seen: Approximately 2:08 PM  I have reviewed the triage vital signs and the nursing notes.   HISTORY  Chief Complaint Fall    HPI Gary Harmon is a 78 y.o. male with a history of CAD, CHF, HTN and renal insufficiency presenting with dizziness and vomiting.  The patient reports that this morning when he got up out of bed he had acute onset of a room spinning sensation with nausea.  This has waxed and waned throughout the morning and is worse with positional changes of his head.  He was sitting on the toilet when he became acutely dizzy and almost fell forward.  He then had an episode of vomiting.  He denies any severe headache, trauma, syncope, chest pain.  He has not had any recent illness other than some mild sinus pressure.  No fevers or chills, cough or cold symptoms.  Past Medical History:  Diagnosis Date  . Gout   . Hypertension   . Rheumatic fever     Patient Active Problem List   Diagnosis Date Noted  . Acute CHF ([place]) 07/10/2015    Past Surgical History:  Procedure Laterality Date  . CARDIAC CATHETERIZATION Right 10/13/2015   Procedure: Right/Left Heart Cath and Coronary Angiography;  Surgeon: Yolonda Kida, MD;  Location: [place] [facility];  Service: Cardiovascular;  Laterality: Right;  . CATARACT EXTRACTION W/PHACO Left 09/26/2014   Procedure: CATARACT EXTRACTION PHACO AND INTRAOCULAR LENS PLACEMENT (IOC);  Surgeon: Estill Cotta, MD;  Location: [facility] OR[facility];  Service: Ophthalmology;  Laterality: Left;  [place] 01:45   . HAMMER TOE SURGERY      Current Outpatient Rx  . Order #: [id number] Class: Historical Med  . Order #: [id number] Class: Normal  . Order #: [id number] Class: Historical Med  . Order #: [id number] Class: Historical Med  . Order #: [id number] Class: Historical Med  . Order #: [id number] Class: Historical Med  . Order #: [id number] Class:  Print  . Order #: [id number] Class: Print  . Order #: [id number] Class: Historical Med  . Order #: [id number] Class: Print  . Order #: [id number] Class: Historical Med  . Order #: [id number] Class: Print  . Order #: [id number] Class: Historical Med  . Order #: [id number] Class: Historical Med    Allergies Patient has no known allergies.  Family History  Problem Relation Age of Onset  . Hypertension Other     Social History Social History   Tobacco Use  . Smoking status: Never Smoker  . Smokeless tobacco: Never Used  Substance Use Topics  . Alcohol use: No    Alcohol/week: 0.0 standard drinks  . Drug use: No    Review of Systems Constitutional: No fever/chills.  Positive dizziness.  No lightheadedness or syncope. Eyes: No visual changes.  No blurred or double vision. ENT: No sore throat. No congestion or rhinorrhea.  Positive sinus pressure. Cardiovascular: Denies chest pain. Denies palpitations. Respiratory: Denies shortness of breath.  No cough. Gastrointestinal: No abdominal pain.  Positive nausea, positive vomiting.  No diarrhea.  No constipation. Genitourinary: Negative for dysuria. Musculoskeletal: Negative for back pain. Skin: Negative for rash. Neurological: Negative for headaches. No focal numbness, tingling or weakness.     ____________________________________________   PHYSICAL EXAM:  VITAL SIGNS: ED Triage Vitals  Enc Vitals Group     BP 07/28/18 1348 (!) 152/78     Pulse Rate 07/28/18 1348 65     Resp 07/28/18 1348 (!) 21     Temp 07/28/18 1348 (!) 97.5 F (  36.4 C)     Temp Source 07/28/18 1348 Oral     SpO2 07/28/18 1348 100 %     Weight 07/28/18 1345 189 lb (85.7 kg)     Height 07/28/18 1345 5\' 8"  (1.727 m)     Head Circumference --      Peak Flow --      Pain Score 07/28/18 1345 0     Pain Loc --      Pain Edu? --      Excl. in [place]? --     Constitutional: Alert and oriented.  Answers questions appropriately. Eyes: Conjunctivae are normal.  EOMI.  PERRLA.  The patient does have several beats of extinguishing nystagmus with a right lateral gaze.  No scleral icterus. Head: Atraumatic. Nose: No congestion/rhinnorhea. Mouth/Throat: Mucous membranes are moist.  Neck: No stridor.  Supple.  No JVD.  No meningismus. Cardiovascular: Normal rate, regular rhythm. No murmurs, rubs or gallops.  Respiratory: Normal respiratory effort.  No accessory muscle use or retractions. Lungs CTAB.  No wheezes, rales or ronchi. Gastrointestinal: Soft, nontender and nondistended.  No guarding or rebound.  No peritoneal signs. Musculoskeletal: No LE edema.  No calf tenderness to palpation or palpable cords. Neurologic: Alert and oriented 3. Speech is clear. Naming and repetition are intact. Face and smile symmetric. Tongue is midline.  EOMI.  PERRLA.  Positive several beats of lateral nystagmus with right lateral gaze that extinguishes.  No vertical nystagmus.  Increased symptoms with movement of the head.  No pronator drift. 5 out of 5 grip, biceps, triceps, hip flexors, plantar flexion and dorsiflexion. Normal sensation to light touch in the bilateral upper and lower extremities, and face.  Skin:  Skin is warm, dry and intact. No rash noted. Psychiatric: Mood and affect are normal. Speech and behavior are normal.  Normal judgement.  ____________________________________________   LABS (all labs ordered are listed, but only abnormal results are displayed)  Labs Reviewed  CBC - Abnormal; Notable for the following components:      Result Value   WBC 12.7 (*)    RBC 3.71 (*)    Hemoglobin 11.3 (*)    HCT 34.5 (*)    All other components within normal limits  COMPREHENSIVE METABOLIC PANEL - Abnormal; Notable for the following components:   Potassium 6.1 (*)    CO2 20 (*)    Glucose, Bld 129 (*)    BUN 85 (*)    Creatinine, Ser 2.44 (*)    GFR calc non Af Amer 25 (*)    GFR calc Af Amer 29 (*)    All other components within normal limits    ____________________________________________  EKG  ED ECG REPORT I, Anne-Caroline Mariea Clonts, the attending physician, personally viewed and interpreted this ECG.   Date: 07/28/2018  EKG Time: 1351  Rate: 64  Rhythm: normal sinus rhythm  Axis: normal  Intervals:none  ST&T Change: No STEMI  I have reviewed this EKG compared to his prior, and is grossly unchanged including the QRS morphology.  No peak T waves  ____________________________________________  RADIOLOGY  Ct Head Wo Contrast  Result Date: 07/28/2018 CLINICAL DATA:  Acute onset of dizziness and falling today. EXAM: CT HEAD WITHOUT CONTRAST TECHNIQUE: Contiguous axial images were obtained from the base of the skull through the vertex without intravenous contrast. COMPARISON:  None. FINDINGS: Brain: No sign of acute infarction by CT. There chronic small-vessel ischemic changes of the pons and throughout the cerebral hemispheric white matter. No cortical or large  vessel territory infarction. No mass lesion, hemorrhage, hydrocephalus or extra-axial collection. Vascular: There is atherosclerotic calcification of the major vessels at the base of the brain. Skull: Negative Sinuses/Orbits: Clear/normal Other: None IMPRESSION: No acute finding by CT. Chronic small-vessel ischemic changes throughout the brain, often seen at this age. Electronically Signed   By: Nelson Chimes M.D.   On: 07/28/2018 15:38    ____________________________________________   PROCEDURES  Procedure(s) performed: None  Procedures  Critical Care performed: No ____________________________________________   INITIAL IMPRESSION / ASSESSMENT AND PLAN / ED COURSE  Pertinent labs & imaging results that were available during my care of the patient were reviewed by me and considered in my medical decision making (see chart for details).  78 y.o. male presenting with several episodes of room spinning dizziness with associated nausea and vomiting.  Clinically, his  symptoms are most consistent with a peripheral vertigo especially with his nystagmus.  However given his age he does have risk for CVA.  Unfortunately, his creatinine today is elevated and he cannot have angiography.  We will get a noncontrasted CT of the head.  The patient has been given meclizine and his symptoms have completely resolved.  ----------------------------------------- 3:30 PM on 07/28/2018 -----------------------------------------  Today the patient has a creatinine of 2.45 and hyperkalemia with a potassium of 6.1.  The patient's EKG has a QRS of 111, and is morphologically unchanged compared to prior.  He does not have any peaked T waves.  His symptoms are most consistent with vertigo, rather than a presyncope or concern for an arrhythmia.  I spoke with Dr. Zollie Scale, who recommends outpatient work-up.  He will receive a dose of Lokelma here, and discharged with a per scription to take this twice daily.  I have given him follow-up instructions as well as return precautions.  ____________________________________________  FINAL CLINICAL IMPRESSION(S) / ED DIAGNOSES  Final diagnoses:  Vertigo  Hyperkalemia  Acute renal failure, unspecified acute renal failure type (HCC)         NEW MEDICATIONS STARTED DURING THIS VISIT:  New Prescriptions   MECLIZINE (ANTIVERT) 25 MG TABLET    Take 1 tablet (25 mg total) by mouth 3 (three) times daily as needed for dizziness.   SODIUM ZIRCONIUM CYCLOSILICATE (LOKELMA) 10 G PACK PACKET    Take 10 g by mouth 2 (two) times daily.      Eula Listen, MD 07/28/18 [phone]

## 2018-07-28 NOTE — Discharge Instructions (Signed)
Today your kidney function is very abnormal, and it is causing her potassium to be high.  Please start taking Lokelma tonight, and take it twice daily to decrease your potassium and prevent your heart from going into an abnormal rhythm.  It is imperative that you establish care with a kidney doctor; Dr. Holley Raring is still seeing patients and you need to schedule an outpatient appointment with him.  For your dizziness, drink plenty of fluid and move slowly to prevent falling down.  You may take meclizine for dizziness and nausea, and Zofran if you need additional help for nausea.  Return to the emergency department if you develop severe pain, lightheadedness or fainting, numbness tingling or weakness, visual changes, mental status changes, speech changes, chest pain or palpitations, or any other symptoms concerning to you.

## 2018-07-28 NOTE — ED Triage Notes (Signed)
78 y/o went to the bathroom felt dizzy stumbled and fell. Was found  by wife stated he was feeling neausead all day. Has a hx of CHF  Vitals have been stable per ems  143/64 98.3 151 cbg

## 2018-07-30 ENCOUNTER — Ambulatory Visit
Admission: RE | Admit: 2018-07-30 | Discharge: 2018-07-30 | Disposition: A | Discharge status: Home / Self Care | Payer: Medicare Other | Source: Ambulatory Visit | Attending: Nephrology | Admitting: Nephrology

## 2018-07-30 ENCOUNTER — Other Ambulatory Visit: Payer: Self-pay | Admitting: Nephrology

## 2018-07-30 ENCOUNTER — Encounter (INDEPENDENT_AMBULATORY_CARE_PROVIDER_SITE_OTHER): Payer: Self-pay

## 2018-07-30 ENCOUNTER — Other Ambulatory Visit: Payer: Self-pay

## 2018-07-30 DIAGNOSIS — N179 Acute kidney failure, unspecified: Secondary | ICD-10-CM | POA: Insufficient documentation

## 2018-07-30 DIAGNOSIS — N183 Chronic kidney disease, stage 3 unspecified: Secondary | ICD-10-CM

## 2018-08-18 ENCOUNTER — Other Ambulatory Visit: Payer: Self-pay

## 2018-08-18 ENCOUNTER — Other Ambulatory Visit (INDEPENDENT_AMBULATORY_CARE_PROVIDER_SITE_OTHER): Payer: Self-pay | Admitting: Nephrology

## 2018-08-18 ENCOUNTER — Ambulatory Visit (INDEPENDENT_AMBULATORY_CARE_PROVIDER_SITE_OTHER): Payer: Medicare Other

## 2018-08-18 DIAGNOSIS — N183 Chronic kidney disease, stage 3 unspecified: Secondary | ICD-10-CM

## 2018-08-18 DIAGNOSIS — N261 Atrophy of kidney (terminal): Secondary | ICD-10-CM | POA: Diagnosis not present

## 2018-08-18 DIAGNOSIS — N179 Acute kidney failure, unspecified: Secondary | ICD-10-CM | POA: Diagnosis not present

## 2018-08-25 ENCOUNTER — Emergency Department: Payer: Medicare Other

## 2018-08-25 ENCOUNTER — Inpatient Hospital Stay
Admission: EM | Admit: 2018-08-25 | Discharge: 2018-09-03 | DRG: 640 | Disposition: A | Discharge status: Home Health | Payer: Medicare Other | Attending: Internal Medicine | Admitting: Internal Medicine

## 2018-08-25 ENCOUNTER — Encounter: Payer: Self-pay | Admitting: Emergency Medicine

## 2018-08-25 ENCOUNTER — Other Ambulatory Visit: Payer: Self-pay

## 2018-08-25 DIAGNOSIS — M109 Gout, unspecified: Secondary | ICD-10-CM | POA: Diagnosis present

## 2018-08-25 DIAGNOSIS — I130 Hypertensive heart and chronic kidney disease with heart failure and stage 1 through stage 4 chronic kidney disease, or unspecified chronic kidney disease: Secondary | ICD-10-CM | POA: Diagnosis present

## 2018-08-25 DIAGNOSIS — K7200 Acute and subacute hepatic failure without coma: Secondary | ICD-10-CM | POA: Diagnosis present

## 2018-08-25 DIAGNOSIS — I493 Ventricular premature depolarization: Secondary | ICD-10-CM | POA: Diagnosis present

## 2018-08-25 DIAGNOSIS — G4733 Obstructive sleep apnea (adult) (pediatric): Secondary | ICD-10-CM | POA: Diagnosis present

## 2018-08-25 DIAGNOSIS — N184 Chronic kidney disease, stage 4 (severe): Secondary | ICD-10-CM | POA: Diagnosis present

## 2018-08-25 DIAGNOSIS — J9690 Respiratory failure, unspecified, unspecified whether with hypoxia or hypercapnia: Secondary | ICD-10-CM

## 2018-08-25 DIAGNOSIS — J9601 Acute respiratory failure with hypoxia: Secondary | ICD-10-CM | POA: Diagnosis present

## 2018-08-25 DIAGNOSIS — Z20828 Contact with and (suspected) exposure to other viral communicable diseases: Secondary | ICD-10-CM | POA: Diagnosis present

## 2018-08-25 DIAGNOSIS — Z6829 Body mass index (BMI) 29.0-29.9, adult: Secondary | ICD-10-CM | POA: Diagnosis not present

## 2018-08-25 DIAGNOSIS — Z79899 Other long term (current) drug therapy: Secondary | ICD-10-CM

## 2018-08-25 DIAGNOSIS — I4901 Ventricular fibrillation: Secondary | ICD-10-CM | POA: Diagnosis present

## 2018-08-25 DIAGNOSIS — J690 Pneumonitis due to inhalation of food and vomit: Secondary | ICD-10-CM | POA: Diagnosis present

## 2018-08-25 DIAGNOSIS — R34 Anuria and oliguria: Secondary | ICD-10-CM | POA: Diagnosis present

## 2018-08-25 DIAGNOSIS — I469 Cardiac arrest, cause unspecified: Secondary | ICD-10-CM | POA: Diagnosis present

## 2018-08-25 DIAGNOSIS — E669 Obesity, unspecified: Secondary | ICD-10-CM | POA: Diagnosis present

## 2018-08-25 DIAGNOSIS — I5022 Chronic systolic (congestive) heart failure: Secondary | ICD-10-CM | POA: Diagnosis present

## 2018-08-25 DIAGNOSIS — Z7982 Long term (current) use of aspirin: Secondary | ICD-10-CM

## 2018-08-25 DIAGNOSIS — E875 Hyperkalemia: Secondary | ICD-10-CM | POA: Diagnosis present

## 2018-08-25 DIAGNOSIS — B9620 Unspecified Escherichia coli [E. coli] as the cause of diseases classified elsewhere: Secondary | ICD-10-CM | POA: Diagnosis present

## 2018-08-25 DIAGNOSIS — N170 Acute kidney failure with tubular necrosis: Secondary | ICD-10-CM | POA: Diagnosis present

## 2018-08-25 DIAGNOSIS — N390 Urinary tract infection, site not specified: Secondary | ICD-10-CM | POA: Diagnosis present

## 2018-08-25 DIAGNOSIS — R570 Cardiogenic shock: Secondary | ICD-10-CM | POA: Diagnosis present

## 2018-08-25 DIAGNOSIS — Z8249 Family history of ischemic heart disease and other diseases of the circulatory system: Secondary | ICD-10-CM

## 2018-08-25 DIAGNOSIS — I480 Paroxysmal atrial fibrillation: Secondary | ICD-10-CM | POA: Diagnosis present

## 2018-08-25 DIAGNOSIS — F329 Major depressive disorder, single episode, unspecified: Secondary | ICD-10-CM | POA: Diagnosis present

## 2018-08-25 DIAGNOSIS — E876 Hypokalemia: Principal | ICD-10-CM | POA: Diagnosis present

## 2018-08-25 DIAGNOSIS — I428 Other cardiomyopathies: Secondary | ICD-10-CM | POA: Diagnosis present

## 2018-08-25 DIAGNOSIS — J9602 Acute respiratory failure with hypercapnia: Secondary | ICD-10-CM | POA: Diagnosis present

## 2018-08-25 DIAGNOSIS — J9600 Acute respiratory failure, unspecified whether with hypoxia or hypercapnia: Secondary | ICD-10-CM

## 2018-08-25 DIAGNOSIS — D649 Anemia, unspecified: Secondary | ICD-10-CM | POA: Diagnosis present

## 2018-08-25 LAB — CBC WITH DIFFERENTIAL/PLATELET
Abs Immature Granulocytes: 0.88 10*3/uL — ABNORMAL HIGH (ref 0.00–0.07)
Basophils Absolute: 0.1 10*3/uL (ref 0.0–0.1)
Basophils Relative: 1 %
Eosinophils Absolute: 0.3 10*3/uL (ref 0.0–0.5)
Eosinophils Relative: 2 %
HCT: 34.8 % — ABNORMAL LOW (ref 39.0–52.0)
Hemoglobin: 10.8 g/dL — ABNORMAL LOW (ref 13.0–17.0)
Immature Granulocytes: 6 %
Lymphocytes Relative: 29 %
Lymphs Abs: 4.3 10*3/uL — ABNORMAL HIGH (ref 0.7–4.0)
MCH: 30.8 pg (ref 26.0–34.0)
MCHC: 31 g/dL (ref 30.0–36.0)
MCV: 99.1 fL (ref 80.0–100.0)
Monocytes Absolute: 0.6 10*3/uL (ref 0.1–1.0)
Monocytes Relative: 4 %
Neutro Abs: 8.7 10*3/uL — ABNORMAL HIGH (ref 1.7–7.7)
Neutrophils Relative %: 58 %
Platelets: 312 10*3/uL (ref 150–400)
RBC: 3.51 MIL/uL — ABNORMAL LOW (ref 4.22–5.81)
RDW: 13.4 % (ref 11.5–15.5)
Smear Review: NORMAL
WBC: 14.8 10*3/uL — ABNORMAL HIGH (ref 4.0–10.5)
nRBC: 0.2 % (ref 0.0–0.2)

## 2018-08-25 LAB — BLOOD GAS, ARTERIAL
Acid-Base Excess: 2.5 mmol/L — ABNORMAL HIGH (ref 0.0–2.0)
Acid-Base Excess: 4.7 mmol/L — ABNORMAL HIGH (ref 0.0–2.0)
Bicarbonate: 28.4 mmol/L — ABNORMAL HIGH (ref 20.0–28.0)
Bicarbonate: 28.2 mmol/L — ABNORMAL HIGH (ref 20.0–28.0)
FIO2: 100
FIO2: 0.4
MECHVT: 500 mL
MECHVT: 500 mL
O2 Saturation: 99.7 %
O2 Saturation: 97.9 %
PEEP: 10 cmH2O
Patient temperature: 37
Patient temperature: 37
RATE: 18 resp/min
RATE: 18 resp/min
pCO2 arterial: 48 mmHg (ref 32.0–48.0)
pCO2 arterial: 37 mmHg (ref 32.0–48.0)
pH, Arterial: 7.38 (ref 7.350–7.450)
pH, Arterial: 7.49 — ABNORMAL HIGH (ref 7.350–7.450)
pO2, Arterial: 202 mmHg — ABNORMAL HIGH (ref 83.0–108.0)
pO2, Arterial: 94 mmHg (ref 83.0–108.0)

## 2018-08-25 LAB — COMPREHENSIVE METABOLIC PANEL
ALT: 20 U/L (ref 0–44)
AST: 52 U/L — ABNORMAL HIGH (ref 15–41)
Albumin: 3.2 g/dL — ABNORMAL LOW (ref 3.5–5.0)
Alkaline Phosphatase: 88 U/L (ref 38–126)
Anion gap: 28 — ABNORMAL HIGH (ref 5–15)
BUN: 21 mg/dL (ref 8–23)
CO2: 21 mmol/L — ABNORMAL LOW (ref 22–32)
Calcium: 10.6 mg/dL — ABNORMAL HIGH (ref 8.9–10.3)
Chloride: 91 mmol/L — ABNORMAL LOW (ref 98–111)
Creatinine, Ser: 1.7 mg/dL — ABNORMAL HIGH (ref 0.61–1.24)
GFR calc Af Amer: 45 mL/min — ABNORMAL LOW (ref >60)
GFR calc non Af Amer: 39 mL/min — ABNORMAL LOW (ref >60)
Glucose, Bld: 310 mg/dL — ABNORMAL HIGH (ref 70–99)
Potassium: <2 mmol/L — CL (ref 3.5–5.1)
Sodium: 140 mmol/L (ref 135–145)
Total Bilirubin: 0.8 mg/dL (ref 0.3–1.2)
Total Protein: 6.5 g/dL (ref 6.5–8.1)

## 2018-08-25 LAB — POTASSIUM
Potassium: <2 mmol/L — CL (ref 3.5–5.1)
Potassium: 2.4 mmol/L — CL (ref 3.5–5.1)
Potassium: 3.8 mmol/L (ref 3.5–5.1)

## 2018-08-25 LAB — PROTIME-INR
INR: 1.4 — ABNORMAL HIGH (ref 0.8–1.2)
Prothrombin Time: 16.7 seconds — ABNORMAL HIGH (ref 11.4–15.2)

## 2018-08-25 LAB — MRSA PCR SCREENING

## 2018-08-25 LAB — URINALYSIS, COMPLETE (UACMP) WITH MICROSCOPIC
Bilirubin Urine: NEGATIVE
Glucose, UA: NEGATIVE mg/dL
Ketones, ur: NEGATIVE mg/dL
Leukocytes,Ua: NEGATIVE
Nitrite: NEGATIVE
Protein, ur: 30 mg/dL — AB
Specific Gravity, Urine: 1.01 (ref 1.005–1.030)
pH: 7 (ref 5.0–8.0)

## 2018-08-25 LAB — GLUCOSE, CAPILLARY
Glucose-Capillary: 218 mg/dL — ABNORMAL HIGH (ref 70–99)
Glucose-Capillary: 188 mg/dL — ABNORMAL HIGH (ref 70–99)

## 2018-08-25 LAB — C DIFFICILE QUICK SCREEN W PCR REFLEX
C Diff antigen: NEGATIVE
C Diff interpretation: NOT DETECTED
C Diff toxin: NEGATIVE

## 2018-08-25 LAB — TROPONIN I: Troponin I: 0.82 ng/mL (ref <0.03)

## 2018-08-25 LAB — SARS CORONAVIRUS 2 BY RT PCR (HOSPITAL ORDER, PERFORMED IN ~~LOC~~ HOSPITAL LAB): SARS Coronavirus 2: NEGATIVE

## 2018-08-25 LAB — APTT: aPTT: 37 seconds — ABNORMAL HIGH (ref 24–36)

## 2018-08-25 LAB — MAGNESIUM: Magnesium: 0.8 mg/dL — CL (ref 1.7–2.4)

## 2018-08-25 MED ORDER — CHLORHEXIDINE GLUCONATE 0.12% ORAL RINSE (MEDLINE KIT)
15.0000 mL | Freq: Two times a day (BID) | OROMUCOSAL | Status: DC
  Administered 2018-08-25 – 2018-08-26 (×2): 15 mL via OROMUCOSAL

## 2018-08-25 MED ORDER — HEPARIN SODIUM (PORCINE) 5000 UNIT/ML IJ SOLN
5000.0000 [IU] | Freq: Three times a day (TID) | INTRAMUSCULAR | Status: DC
  Administered 2018-08-25 – 2018-09-03 (×24): 5000 [IU] via SUBCUTANEOUS
  Filled 2018-08-25 (×23): qty 1

## 2018-08-25 MED ORDER — ETOMIDATE 2 MG/ML IV SOLN
10.0000 mg | Freq: Once | INTRAVENOUS | Status: AC
  Administered 2018-08-25: 09:00:00 10 mg via INTRAVENOUS

## 2018-08-25 MED ORDER — ACETAMINOPHEN 650 MG RE SUPP: 650.0000 mg | Freq: Four times a day (QID) | RECTAL | Status: DC | PRN

## 2018-08-25 MED ORDER — MAGNESIUM SULFATE 2 GM/50ML IV SOLN
INTRAVENOUS | Status: AC
  Filled 2018-08-25: qty 50

## 2018-08-25 MED ORDER — SODIUM CHLORIDE 0.9 % IV BOLUS
1000.0000 mL | Freq: Once | INTRAVENOUS | Status: AC
  Administered 2018-08-25: 09:00:00 1000 mL via INTRAVENOUS

## 2018-08-25 MED ORDER — ACETAMINOPHEN 325 MG PO TABS
650.0000 mg | ORAL_TABLET | Freq: Four times a day (QID) | ORAL | Status: DC | PRN
  Administered 2018-08-26 – 2018-08-27 (×3): 650 mg via ORAL
  Filled 2018-08-25 (×3): qty 2

## 2018-08-25 MED ORDER — SODIUM CHLORIDE 0.9 % IV SOLN
0.0000 ug/min | INTRAVENOUS | Status: DC
  Administered 2018-08-25: 21:00:00 20 ug/min via INTRAVENOUS
  Administered 2018-08-26: 07:00:00 30 ug/min via INTRAVENOUS
  Administered 2018-08-26: 02:00:00 50 ug/min via INTRAVENOUS
  Administered 2018-08-26 (×2): 35 ug/min via INTRAVENOUS
  Filled 2018-08-25: qty 1
  Filled 2018-08-25 (×4): qty 10
  Filled 2018-08-25: qty 1

## 2018-08-25 MED ORDER — POTASSIUM CHLORIDE IN NACL 20-0.9 MEQ/L-% IV SOLN
Freq: Once | INTRAVENOUS | Status: AC
  Administered 2018-08-25: 11:00:00 via INTRAVENOUS
  Filled 2018-08-25: qty 1000

## 2018-08-25 MED ORDER — ORAL CARE MOUTH RINSE
15.0000 mL | OROMUCOSAL | Status: DC
  Administered 2018-08-25 – 2018-08-26 (×7): 15 mL via OROMUCOSAL

## 2018-08-25 MED ORDER — MAGNESIUM SULFATE 2 GM/50ML IV SOLN
2.0000 g | Freq: Once | INTRAVENOUS | Status: AC
  Administered 2018-08-25: 11:00:00 2 g via INTRAVENOUS

## 2018-08-25 MED ORDER — LACTATED RINGERS IV SOLN
INTRAVENOUS | Status: DC
  Administered 2018-08-25: 22:00:00 via INTRAVENOUS

## 2018-08-25 MED ORDER — DEXMEDETOMIDINE HCL IN NACL 400 MCG/100ML IV SOLN
0.4000 ug/kg/h | INTRAVENOUS | Status: DC
  Administered 2018-08-25: 20:00:00 0.4 ug/kg/h via INTRAVENOUS
  Administered 2018-08-26: 04:00:00 1 ug/kg/h via INTRAVENOUS
  Filled 2018-08-25 (×2): qty 100

## 2018-08-25 MED ORDER — ALBUTEROL SULFATE (2.5 MG/3ML) 0.083% IN NEBU: 2.5000 mg | INHALATION_SOLUTION | RESPIRATORY_TRACT | Status: DC | PRN

## 2018-08-25 MED ORDER — PROPOFOL 1000 MG/100ML IV EMUL
INTRAVENOUS | Status: AC
  Filled 2018-08-25: qty 100

## 2018-08-25 MED ORDER — FENTANYL 2500MCG IN NS 250ML (10MCG/ML) PREMIX INFUSION
0.0000 ug/h | INTRAVENOUS | Status: DC
  Administered 2018-08-25: 150 ug/h via INTRAVENOUS
  Filled 2018-08-25 (×2): qty 250

## 2018-08-25 MED ORDER — LACTATED RINGERS IV BOLUS
250.0000 mL | Freq: Once | INTRAVENOUS | Status: AC
  Administered 2018-08-25: 250 mL via INTRAVENOUS

## 2018-08-25 MED ORDER — POTASSIUM CHLORIDE 10 MEQ/100ML IV SOLN
10.0000 meq | INTRAVENOUS | Status: AC
  Administered 2018-08-25 (×6): 10 meq via INTRAVENOUS
  Filled 2018-08-25 (×6): qty 100

## 2018-08-25 MED ORDER — CALCIUM GLUCONATE 10 % IV SOLN
INTRAVENOUS | Status: AC
  Filled 2018-08-25: qty 10

## 2018-08-25 MED ORDER — POLYETHYLENE GLYCOL 3350 17 G PO PACK: 17.0000 g | PACK | Freq: Every day | ORAL | Status: DC | PRN

## 2018-08-25 MED ORDER — PROPOFOL 1000 MG/100ML IV EMUL
5.0000 ug/kg/min | INTRAVENOUS | Status: DC
  Administered 2018-08-25 (×2): 10 ug/kg/min via INTRAVENOUS
  Filled 2018-08-25: qty 100

## 2018-08-25 MED ORDER — SODIUM CHLORIDE 0.9 % IV SOLN
1.0000 g | Freq: Once | INTRAVENOUS | Status: AC
  Administered 2018-08-25: 10:00:00 1 g via INTRAVENOUS
  Filled 2018-08-25: qty 10

## 2018-08-25 MED ORDER — POTASSIUM CHLORIDE 10 MEQ/100ML IV SOLN
10.0000 meq | Freq: Once | INTRAVENOUS | Status: AC
  Administered 2018-08-25: 11:00:00 10 meq via INTRAVENOUS
  Filled 2018-08-25: qty 100

## 2018-08-25 MED ORDER — ONDANSETRON HCL 4 MG PO TABS: 4.0000 mg | ORAL_TABLET | Freq: Four times a day (QID) | ORAL | Status: DC | PRN

## 2018-08-25 MED ORDER — ONDANSETRON HCL 4 MG/2ML IJ SOLN: 4.0000 mg | Freq: Four times a day (QID) | INTRAMUSCULAR | Status: DC | PRN

## 2018-08-25 MED ORDER — LACTATED RINGERS IV BOLUS
250.0000 mL | Freq: Once | INTRAVENOUS | Status: AC
  Administered 2018-08-26: 250 mL via INTRAVENOUS

## 2018-08-25 MED ORDER — ROCURONIUM BROMIDE 50 MG/5ML IV SOLN
100.0000 mg | Freq: Once | INTRAVENOUS | Status: AC
  Administered 2018-08-25: 09:00:00 100 mg via INTRAVENOUS
  Filled 2018-08-25: qty 10

## 2018-08-25 NOTE — ED Notes (Signed)
Patient transported to CT 

## 2018-08-25 NOTE — H&P (Addendum)
[facility] NAME: Gary Harmon    MR#:  [id number]  DATE OF BIRTH:  06/02/1940  DATE OF ADMISSION:  08/25/2018  PRIMARY CARE PHYSICIAN: Valera Castle, MD   REQUESTING/REFERRING PHYSICIAN: Dr Kerman Passey  CHIEF COMPLAINT:   Patient collapsed while at [employer] and was resuscitated in the field HISTORY OF PRESENT ILLNESS:  Gary Harmon  is a 78 y.o. male with a known history of congestive heart failure systolic, chronic kidney disease stage III, hypertension, gout comes to the emergency room after he had a cardiac arrest at [employer] while shopping. According to the wife and ER records patient was having a routine day and while at checkout per wife he all of a sudden collapsed. Immediate CPR was initiated at the store. Upon EMS arrival patient was alternated between V. fib and PEA arrest. He was shocked four times per EMS. Left tibia intra-osseous line was obtained. Received 6 mg of epinephrine, 450 mg of amiodarone, and  bicarb and one amp of calcium. Patient was intubated on the ventilator now. He is minimally responsive with signs of purposeful movement. Per ER physician he is therefore not considered code ICE. EKG shows sinus tachycardia with PVCs. CT head CT cervical spine essentially negative.  Patient was noted to have potassium of less than 2.0. He is getting IV potassium IV magnesium. He is currently on IV propofol. Patient is being admitted for further evaluation management.  PAST MEDICAL HISTORY:   Past Medical History:  Diagnosis Date  . Gout   . Hypertension   . Rheumatic fever     PAST SURGICAL HISTOIRY:   Past Surgical History:  Procedure Laterality Date  . CARDIAC CATHETERIZATION Right 10/13/2015   Procedure: Right/Left Heart Cath and Coronary Angiography;  Surgeon: Yolonda Kida, MD;  Location: [facility];  Service: Cardiovascular;  Laterality: Right;  . CATARACT EXTRACTION W/PHACO Left 09/26/2014    Procedure: CATARACT EXTRACTION PHACO AND INTRAOCULAR LENS PLACEMENT (IOC);  Surgeon: Estill Cotta, MD;  Location: [facility];  Service: Ophthalmology;  Laterality: Left;  [place] 01:45   . HAMMER TOE SURGERY      SOCIAL HISTORY:   Social History   Tobacco Use  . Smoking status: Never Smoker  . Smokeless tobacco: Never Used  Substance Use Topics  . Alcohol use: No    Alcohol/week: 0.0 standard drinks    FAMILY HISTORY:   Family History  Problem Relation Age of Onset  . Hypertension Other     DRUG ALLERGIES:  No Known Allergies  REVIEW OF SYSTEMS:  Review of Systems  Unable to perform ROS: Intubated     MEDICATIONS AT HOME:   Prior to Admission medications   Medication Sig Start Date End Date Taking? Authorizing Provider  acetaminophen (TYLENOL) 325 MG tablet Take 650 mg by mouth every 6 (six) hours as needed for mild pain.    Yes [provider]  allopurinol (ZYLOPRIM) 100 MG tablet Take 1 tablet (100 mg total) by mouth 2 (two) times daily. Patient taking differently: Take 100 mg by mouth daily.  09/29/14  Yes Hyatt, Max T, DPM  ALPRAZolam Duanne Moron) 0.5 MG tablet Take 0.5 mg by mouth at bedtime as needed for sleep. 01/05/16  Yes [provider]  aspirin EC 81 MG tablet Take 81 mg by mouth daily.   Yes [provider]  benazepril (LOTENSIN) 20 MG tablet Take 20 mg by mouth 2 (two) times daily.    Yes  [provider]  citalopram (CELEXA) 10 MG tablet Take 10 mg by mouth daily. 06/01/18  Yes [provider]  metoprolol succinate (TOPROL-XL) 100 MG 24 hr tablet Take 100 mg by mouth daily.   Yes [provider]  pravastatin (PRAVACHOL) 10 MG tablet Take 10 mg by mouth daily.  06/08/18 06/08/19 Yes [provider]  sodium zirconium cyclosilicate (LOKELMA) 10 g PACK packet Take 10 g by mouth 2 (two) times daily. Patient taking differently: Take 10 g by mouth daily.  07/28/18  Yes Eula Listen, MD  torsemide (DEMADEX)  20 MG tablet Take 20 mg by mouth daily.   Yes [provider]      VITAL SIGNS:  Blood pressure 103/79, pulse 87, temperature (!) 96.6 F (35.9 C), resp. rate (!) 21, height 5\' 7"  (1.702 m), weight 86.2 kg, SpO2 99 %.  PHYSICAL EXAMINATION:  GENERAL:  78 y.o.-year-old patient lying in the bed with no acute distress. Obese critically ill EYES: Pupils equal, round, reactive to light and accommodation. No scleral icterus.  HEENT: Head atraumatic, normocephalic. Oropharynx and nasopharynx clear. Intubated on the ventilator NECK:  Supple, no jugular venous distention. No thyroid enlargement, no tenderness.  LUNGS: Normal breath sounds bilaterally, no wheezing, rales,rhonchi or crepitation. No use of accessory muscles of respiration.  CARDIOVASCULAR: S1, S2 normal. No murmurs, rubs, or gallops.  ABDOMEN: Soft, nontender, nondistended. Bowel sounds present. No organomegaly or mass.  EXTREMITIES: No pedal edema, cyanosis, or clubbing.  NEUROLOGIC: sedated intubated on the ventilator.  PSYCHIATRIC: The patient is sedated on the vent SKIN: No obvious rash, lesion, or ulcer.   LABORATORY PANEL:   CBC Recent Labs  Lab 08/25/18 0913  WBC 14.8*  HGB 10.8*  HCT 34.8*  PLT 312   ------------------------------------------------------------------------------------------------------------------  Chemistries  Recent Labs  Lab 08/25/18 0913 08/25/18 1040  NA 140  --   K <2.0* <2.0*  CL 91*  --   CO2 21*  --   GLUCOSE 310*  --   BUN 21  --   CREATININE 1.70*  --   CALCIUM 10.6*  --   MG  --  0.8*  AST 52*  --   ALT 20  --   ALKPHOS 88  --   BILITOT 0.8  --    ------------------------------------------------------------------------------------------------------------------  Cardiac Enzymes Recent Labs  Lab 08/25/18 0913  TROPONINI 0.82*   ------------------------------------------------------------------------------------------------------------------  RADIOLOGY:  Ct  Head Wo Contrast  Result Date: 08/25/2018 CLINICAL DATA:  Cardiac arrest with resuscitation. EXAM: CT HEAD WITHOUT CONTRAST CT CERVICAL SPINE WITHOUT CONTRAST TECHNIQUE: Multidetector CT imaging of the head and cervical spine was performed following the standard protocol without intravenous contrast. Multiplanar CT image reconstructions of the cervical spine were also generated. COMPARISON:  CT head 07/28/2018 FINDINGS: CT HEAD FINDINGS Brain: Mild atrophy without hydrocephalus. Moderate chronic white matter changes. No acute infarct, hemorrhage, or mass. Vascular: Atherosclerotic calcification. Negative for hyperdense vessel Skull: Negative Sinuses/Orbits: Negative Other: None CT CERVICAL SPINE FINDINGS Alignment: Normal Skull base and vertebrae: Negative for fracture Soft tissues and spinal canal: 10 mm nodule left upper pole of the thyroid. Atherosclerotic calcification carotid bifurcation bilaterally. No soft tissue mass or adenopathy Disc levels: Disc degeneration and spurring most prominent at C5-6 and C6-7 causing spinal and foraminal stenosis bilaterally. Upper chest: Endotracheal tube and NG tube noted. Moderately large layering pleural effusions bilaterally with bibasilar atelectasis. Other: None IMPRESSION: 1. No acute intracranial abnormality. Atrophy and moderate chronic microvascular ischemic change 2. Negative for cervical spine fracture.  Cervical spondylosis 3. Moderately large bilateral pleural effusions. Electronically Signed   By: Franchot Gallo M.D.   On: 08/25/2018 10:11   Ct Cervical Spine Wo Contrast  Result Date: 08/25/2018 CLINICAL DATA:  Cardiac arrest with resuscitation. EXAM: CT HEAD WITHOUT CONTRAST CT CERVICAL SPINE WITHOUT CONTRAST TECHNIQUE: Multidetector CT imaging of the head and cervical spine was performed following the standard protocol without intravenous contrast. Multiplanar CT image reconstructions of the cervical spine were also generated. COMPARISON:  CT head  07/28/2018 FINDINGS: CT HEAD FINDINGS Brain: Mild atrophy without hydrocephalus. Moderate chronic white matter changes. No acute infarct, hemorrhage, or mass. Vascular: Atherosclerotic calcification. Negative for hyperdense vessel Skull: Negative Sinuses/Orbits: Negative Other: None CT CERVICAL SPINE FINDINGS Alignment: Normal Skull base and vertebrae: Negative for fracture Soft tissues and spinal canal: 10 mm nodule left upper pole of the thyroid. Atherosclerotic calcification carotid bifurcation bilaterally. No soft tissue mass or adenopathy Disc levels: Disc degeneration and spurring most prominent at C5-6 and C6-7 causing spinal and foraminal stenosis bilaterally. Upper chest: Endotracheal tube and NG tube noted. Moderately large layering pleural effusions bilaterally with bibasilar atelectasis. Other: None IMPRESSION: 1. No acute intracranial abnormality. Atrophy and moderate chronic microvascular ischemic change 2. Negative for cervical spine fracture.  Cervical spondylosis 3. Moderately large bilateral pleural effusions. Electronically Signed   By: Franchot Gallo M.D.   On: 08/25/2018 10:11   Dg Chest Portable 1 View  Result Date: 08/25/2018 CLINICAL DATA:  Cardiac arrest. EXAM: PORTABLE CHEST 1 VIEW COMPARISON:  07/10/2015 FINDINGS: Endotracheal tube tip is 6 cm above the carina. Orogastric or nasogastric tube enters the stomach. Heart is enlarged. There is widespread pulmonary density most consistent with edema. Aspiration not excluded. No visible effusion. IMPRESSION: Endotracheal tube and orogastric or nasogastric tube well positioned. Widespread pulmonary density probably representing edema. Some coexistent aspiration not excluded. Electronically Signed   By: Nelson Chimes M.D.   On: 08/25/2018 10:00   Dg Abd Portable 1 View  Result Date: 08/25/2018 CLINICAL DATA:  Cardiac arrest. EXAM: PORTABLE ABDOMEN - 1 VIEW COMPARISON:  None. FINDINGS: Orogastric tube has its tip in the mid body of the  stomach. Gas pattern appears unremarkable. No abnormal calcifications. Ordinary degenerative changes affect the spine IMPRESSION: Orogastric tube tip in the mid body of the stomach Electronically Signed   By: Nelson Chimes M.D.   On: 08/25/2018 10:01    EKG:    IMPRESSION AND PLAN:   Divonte Senger  is a 78 y.o. male with a known history of congestive heart failure systolic, chronic kidney disease stage III, hypertension, gout comes to the emergency room after he had a cardiac arrest at [employer] while shopping. According to the wife and ER records patient was having a routine day and while at checkout per wife he all of a sudden collapsed.   1. Cardiorespiratory arrest. Patient currently intubated on the ventilator -admit to ICU -ER physician spoke with ICU attending Dr. Jamal Collin -continue IV propofol, IV fluids  2. Severe electrolyte abnormality with hypokalemia and severe hypomagnesimia -patient currently getting IV potassium and IV magnesium -check electrolytes periodically and replace as indicated -patient recently had hyperkalemia came to the emergency room was taken of his potassium and lokelma was started  3. Chronic kidney disease stage IV follows with Dr. Zollie Scale -baseline creatinine around 1.5 -spoke with Dr Candiss Norse  4. Elevated troponin in the setting of cardiac arrest -no history of CAD per family -Dr. Ubaldo Glassing informed. Cardiology consultation placed -cycle cardiac enzymes times three  5. Mild leukocytosis appears reactive  6. DVT prophylaxis subcu heparin  Above was discussed with patient's wife and daughter in the ER. Critical nature illness explained they do voice understanding   All the records are reviewed and case discussed with ED provider.   CODE STATUS: FULL code  TOTAL critical TIME TAKING CARE OF THIS PATIENT: 55 minutes.    Fritzi Mandes M.D on 08/25/2018 at 11:29 AM  Between 7am to 6pm - Pager - [phone]  After 6pm go to [email or web address] - password [email or web address]  [facility]  [facility]  Office  [phone]  CC: Primary care physician; Valera Castle, MD

## 2018-08-25 NOTE — Progress Notes (Signed)
[place], [place] 08/25/18  Subjective:   78 year old Caucasian male with past medical history hypertension, obesity, lower extremity edema, chronic systolic heart failure with ejection fraction of 40%, obstructive sleep apnea, gout, and depression He is known to our practice from outpatient and is followed by Dr Holley Raring  Today he presents after he had cardiac arrest at [place] Per notes, CPR, V Fib and PEA in field. Shocked x 4 ALso received Epi, amiodarone, bicarb ER labs show extremely  Low potassium Patient is now being monitored in ICU   Objective:  Vital signs in last 24 hours:  Temp:  [96.4 F (35.8 C)-97.1 F (36.2 C)] 96.7 F (35.9 C) (05/12 1145) Pulse Rate:  [83-107] 100 (05/12 1145) Resp:  [19-23] 20 (05/12 1145) BP: (89-143)/(69-115) 122/88 (05/12 1145) SpO2:  [91 %-99 %] 97 % (05/12 1145) FiO2 (%):  [100 %] 100 % (05/12 0923) Weight:  [86.2 kg] 86.2 kg (05/12 0910)  Weight change:  Filed Weights   08/25/18 0910  Weight: 86.2 kg    Intake/Output:   No intake or output data in the 24 hours ending 08/25/18 1225   Physical Exam: General: Critically ill appearing  HEENT Anicteric, ETT in place  Neck supple  Pulm/lungs Vent assisted, Fio2 50%, clear to auscultation  CVS/Heart Regular, tachycardic  Abdomen:  Soft. NT  Extremities: + pitting edema  Neurologic: sedated  Skin: No acute rashes  Access:    Foley in place    Basic Metabolic Panel:  Recent Labs  Lab 08/25/18 0913 08/25/18 1040  NA 140  --   K <2.0* <2.0*  CL 91*  --   CO2 21*  --   GLUCOSE 310*  --   BUN 21  --   CREATININE 1.70*  --   CALCIUM 10.6*  --   MG  --  0.8*     CBC: Recent Labs  Lab 08/25/18 0913  WBC 14.8*  NEUTROABS 8.7*  HGB 10.8*  HCT 34.8*  MCV 99.1  PLT 312     No results found for: HEPBSAG, HEPBSAB, HEPBIGM    Microbiology:  Recent Results (from the past 240 hour(s))  SARS Coronavirus 2 (CEPHEID - Performed in [place] hospital lab), Hosp Order     Status: None   Collection Time: 08/25/18  9:13 AM  Result Value Ref Range Status   SARS Coronavirus 2 NEGATIVE NEGATIVE Final    Comment: (NOTE) If result is NEGATIVE SARS-CoV-2 target nucleic acids are NOT DETECTED. The SARS-CoV-2 RNA is generally detectable in upper and lower  respiratory specimens during the acute phase of infection. The lowest  concentration of SARS-CoV-2 viral copies this assay can detect is 250  copies / mL. A negative result does not preclude SARS-CoV-2 infection  and should not be used as the sole basis for treatment or other  patient management decisions.  A negative result may occur with  improper specimen collection / handling, submission of specimen other  than nasopharyngeal swab, presence of viral mutation(s) within the  areas targeted by this assay, and inadequate number of viral copies  (<250 copies / mL). A negative result must be combined with clinical  observations, patient history, and epidemiological information. If result is POSITIVE SARS-CoV-2 target nucleic acids are DETECTED. The SARS-CoV-2 RNA is generally detectable in upper and lower  respiratory specimens dur ing the acute phase of infection.  Positive  results are indicative of active infection with SARS-CoV-2.  Clinical  correlation with patient history  and other diagnostic information is  necessary to determine patient infection status.  Positive results do  not rule out bacterial infection or co-infection with other viruses. If result is PRESUMPTIVE POSTIVE SARS-CoV-2 nucleic acids MAY BE PRESENT.   A presumptive positive result was obtained on the submitted specimen  and confirmed on repeat testing.  While 2019 novel coronavirus  (SARS-CoV-2) nucleic acids may be present in the submitted sample  additional confirmatory testing may be necessary for epidemiological  and / or clinical management purposes  to differentiate between  SARS-CoV-2 and  other Sarbecovirus currently known to infect humans.  If clinically indicated additional testing with an alternate test  methodology [phone]) is advised. The SARS-CoV-2 RNA is generally  detectable in upper and lower respiratory sp ecimens during the acute  phase of infection. The expected result is Negative. Fact Sheet for Patients:  StrictlyIdeas.no Fact Sheet for Healthcare Providers: BankingDealers.co.za This test is not yet approved or cleared by the [place] FDA and has been authorized for detection and/or diagnosis of SARS-CoV-2 by FDA under an Emergency Use Authorization (EUA).  This EUA will remain in effect (meaning this test can be used) for the duration of the COVID-19 declaration under Section 564(b)(1) of the Act, 21 U.S.C. section 360bbb-3(b)(1), unless the authorization is terminated or revoked sooner. Performed at [facility], [address] [place], [place] [address]     Coagulation Studies: Recent Labs    08/25/18 0913  LABPROT 16.7*  INR 1.4*    Urinalysis: Recent Labs    08/25/18 0913  COLORURINE YELLOW*  LABSPEC 1.010  PHURINE 7.0  GLUCOSEU NEGATIVE  HGBUR SMALL*  BILIRUBINUR NEGATIVE  KETONESUR NEGATIVE  PROTEINUR 30*  NITRITE NEGATIVE  LEUKOCYTESUR NEGATIVE      Imaging: Ct Head Wo Contrast  Result Date: 08/25/2018 CLINICAL DATA:  Cardiac arrest with resuscitation. EXAM: CT HEAD WITHOUT CONTRAST CT CERVICAL SPINE WITHOUT CONTRAST TECHNIQUE: Multidetector CT imaging of the head and cervical spine was performed following the standard protocol without intravenous contrast. Multiplanar CT image reconstructions of the cervical spine were also generated. COMPARISON:  CT head 07/28/2018 FINDINGS: CT HEAD FINDINGS Brain: Mild atrophy without hydrocephalus. Moderate chronic white matter changes. No acute infarct, hemorrhage, or mass. Vascular: Atherosclerotic calcification. Negative for  hyperdense vessel Skull: Negative Sinuses/Orbits: Negative Other: None CT CERVICAL SPINE FINDINGS Alignment: Normal Skull base and vertebrae: Negative for fracture Soft tissues and spinal canal: 10 mm nodule left upper pole of the thyroid. Atherosclerotic calcification carotid bifurcation bilaterally. No soft tissue mass or adenopathy Disc levels: Disc degeneration and spurring most prominent at C5-6 and C6-7 causing spinal and foraminal stenosis bilaterally. Upper chest: Endotracheal tube and NG tube noted. Moderately large layering pleural effusions bilaterally with bibasilar atelectasis. Other: None IMPRESSION: 1. No acute intracranial abnormality. Atrophy and moderate chronic microvascular ischemic change 2. Negative for cervical spine fracture.  Cervical spondylosis 3. Moderately large bilateral pleural effusions. Electronically Signed   By: Franchot Gallo M.D.   On: 08/25/2018 10:11   Ct Cervical Spine Wo Contrast  Result Date: 08/25/2018 CLINICAL DATA:  Cardiac arrest with resuscitation. EXAM: CT HEAD WITHOUT CONTRAST CT CERVICAL SPINE WITHOUT CONTRAST TECHNIQUE: Multidetector CT imaging of the head and cervical spine was performed following the standard protocol without intravenous contrast. Multiplanar CT image reconstructions of the cervical spine were also generated. COMPARISON:  CT head 07/28/2018 FINDINGS: CT HEAD FINDINGS Brain: Mild atrophy without hydrocephalus. Moderate chronic white matter changes. No acute infarct, hemorrhage, or mass. Vascular: Atherosclerotic calcification.  Negative for hyperdense vessel Skull: Negative Sinuses/Orbits: Negative Other: None CT CERVICAL SPINE FINDINGS Alignment: Normal Skull base and vertebrae: Negative for fracture Soft tissues and spinal canal: 10 mm nodule left upper pole of the thyroid. Atherosclerotic calcification carotid bifurcation bilaterally. No soft tissue mass or adenopathy Disc levels: Disc degeneration and spurring most prominent at C5-6 and C6-7  causing spinal and foraminal stenosis bilaterally. Upper chest: Endotracheal tube and NG tube noted. Moderately large layering pleural effusions bilaterally with bibasilar atelectasis. Other: None IMPRESSION: 1. No acute intracranial abnormality. Atrophy and moderate chronic microvascular ischemic change 2. Negative for cervical spine fracture.  Cervical spondylosis 3. Moderately large bilateral pleural effusions. Electronically Signed   By: Franchot Gallo M.D.   On: 08/25/2018 10:11   Dg Chest Portable 1 View  Result Date: 08/25/2018 CLINICAL DATA:  Cardiac arrest. EXAM: PORTABLE CHEST 1 VIEW COMPARISON:  07/10/2015 FINDINGS: Endotracheal tube tip is 6 cm above the carina. Orogastric or nasogastric tube enters the stomach. Heart is enlarged. There is widespread pulmonary density most consistent with edema. Aspiration not excluded. No visible effusion. IMPRESSION: Endotracheal tube and orogastric or nasogastric tube well positioned. Widespread pulmonary density probably representing edema. Some coexistent aspiration not excluded. Electronically Signed   By: Nelson Chimes M.D.   On: 08/25/2018 10:00   Dg Abd Portable 1 View  Result Date: 08/25/2018 CLINICAL DATA:  Cardiac arrest. EXAM: PORTABLE ABDOMEN - 1 VIEW COMPARISON:  None. FINDINGS: Orogastric tube has its tip in the mid body of the stomach. Gas pattern appears unremarkable. No abnormal calcifications. Ordinary degenerative changes affect the spine IMPRESSION: Orogastric tube tip in the mid body of the stomach Electronically Signed   By: Nelson Chimes M.D.   On: 08/25/2018 10:01     Medications:   . propofol (DIPRIVAN) infusion 5 mcg/kg/min (08/25/18 1115)   . calcium gluconate         Assessment/ Plan:  78 y.o. Caucasian male  with past medical history hypertension, obesity, lower extremity edema, chronic systolic heart failure with ejection fraction of 40%, obstructive sleep apnea, gout, and depression  1. Severe Hypokalemia 2. CKD st  3. Baseline Cr 1.50/GFR 45 on 08/17/2018 3. Hypomagnesemia 4. Acute resp failure, post outpatient cardiac arrest 5. LE Edema  Plan: Agree with aggressive iv magnesium and potassium replacement Check potassium every 4 hours Vent assisted,  Management as per ICU team Will follow   LOS: 0 Faren Florence 5/12/202012:25 PM  [place], [place]  Note: This note was prepared with Dragon dictation. Any transcription errors are unintentional

## 2018-08-25 NOTE — H&P (Signed)
PATIENT NAME: Gary Harmon   MR#:  [id number] DATE OF BIRTH:  1940/08/07 DATE OF ADMISSION:  08/25/2018 PRIMARY CARE PHYSICIAN: Valera Castle, MD  REQUESTING/REFERRING PHYSICIAN: Dr Kerman Passey  CHIEF COMPLAINT:   S/p cardiac arrest HISTORY OF PRESENT ILLNESS:  Gary Harmon  is a 78 y.o. male  +CHF +CKD stage 3 collapsed at [facility]-CPR started Cardiac arrest more than 40 mins + Upon EMS arrival patient was alternated between V. fib and PEA arrest. + He was shocked four times per EMS  Had purposeful  Movements  in ER, NOT A candidate for CODE ICE Patient intubated  Has multiorgan failure Critically ill  PAST MEDICAL HISTORY:   Past Medical History:  Diagnosis Date  . Gout   . Hypertension   . Rheumatic fever     PAST SURGICAL HISTOIRY:   Past Surgical History:  Procedure Laterality Date  . CARDIAC CATHETERIZATION Right 10/13/2015   Procedure: Right/Left Heart Cath and Coronary Angiography;  Surgeon: Yolonda Kida, MD;  Location: [facility];  Service: Cardiovascular;  Laterality: Right;  . CATARACT EXTRACTION W/PHACO Left 09/26/2014   Procedure: CATARACT EXTRACTION PHACO AND INTRAOCULAR LENS PLACEMENT (IOC);  Surgeon: Estill Cotta, MD;  Location: [facility];  Service: Ophthalmology;  Laterality: Left;  [place] 01:45   . HAMMER TOE SURGERY      SOCIAL HISTORY:   Social History   Tobacco Use  . Smoking status: Never Smoker  . Smokeless tobacco: Never Used  Substance Use Topics  . Alcohol use: No    Alcohol/week: 0.0 standard drinks    FAMILY HISTORY:   Family History  Problem Relation Age of Onset  . Hypertension Other     DRUG ALLERGIES:  No Known Allergies  REVIEW OF SYSTEMS:  Review of Systems  Unable to perform ROS: Intubated     MEDICATIONS AT HOME:   Prior to Admission medications   Medication Sig Start Date End Date Taking? Authorizing Provider  acetaminophen (TYLENOL) 325 MG tablet Take 650 mg by mouth every 6 (six) hours  as needed for mild pain.    Yes [provider]  allopurinol (ZYLOPRIM) 100 MG tablet Take 1 tablet (100 mg total) by mouth 2 (two) times daily. Patient taking differently: Take 100 mg by mouth daily.  09/29/14  Yes Hyatt, Max T, DPM  ALPRAZolam Duanne Moron) 0.5 MG tablet Take 0.5 mg by mouth at bedtime as needed for sleep. 01/05/16  Yes [provider]  aspirin EC 81 MG tablet Take 81 mg by mouth daily.   Yes [provider]  benazepril (LOTENSIN) 20 MG tablet Take 20 mg by mouth 2 (two) times daily.    Yes [provider]  citalopram (CELEXA) 10 MG tablet Take 10 mg by mouth daily. 06/01/18  Yes [provider]  metoprolol succinate (TOPROL-XL) 100 MG 24 hr tablet Take 100 mg by mouth daily.   Yes [provider]  pravastatin (PRAVACHOL) 10 MG tablet Take 10 mg by mouth daily.  06/08/18 06/08/19 Yes [provider]  sodium zirconium cyclosilicate (LOKELMA) 10 g PACK packet Take 10 g by mouth 2 (two) times daily. Patient taking differently: Take 10 g by mouth daily.  07/28/18  Yes Eula Listen, MD  torsemide (DEMADEX) 20 MG tablet Take 20 mg by mouth daily.   Yes [provider]     VITAL SIGNS:  Blood pressure 106/74, pulse 89, temperature (!) 96.8 F (36 C), resp. rate 18, height 5\' 7"  (1.702 m), weight 89.2  kg, SpO2 92 %.  Marland KitchenKKRO PHYSICAL EXAMINATION:  GENERAL:critically ill appearing, +resp distress HEAD: Normocephalic, atraumatic.  EYES: Pupils equal, round, reactive to light.  No scleral icterus.  MOUTH: Moist mucosal membrane. NECK: Supple. No thyromegaly. No nodules. No JVD.  PULMONARY: +rhonchi, +wheezing CARDIOVASCULAR: S1 and S2. Regular rate and rhythm. No murmurs, rubs, or gallops.  GASTROINTESTINAL: Soft, nontender, -distended. No masses. Positive bowel sounds. No hepatosplenomegaly.  MUSCULOSKELETAL: No swelling, clubbing, or edema.  NEUROLOGIC: obtunded SKIN:intact,warm,dry   LABORATORY PANEL:    CBC Recent Labs  Lab 08/25/18 0913  WBC 14.8*  HGB 10.8*  HCT 34.8*  PLT 312   ------------------------------------------------------------------------------------------------------------------  Chemistries  Recent Labs  Lab 08/25/18 0913 08/25/18 1040 08/25/18 1300  NA 140  --   --   K <2.0* <2.0* 2.4*  CL 91*  --   --   CO2 21*  --   --   GLUCOSE 310*  --   --   BUN 21  --   --   CREATININE 1.70*  --   --   CALCIUM 10.6*  --   --   MG  --  0.8*  --   AST 52*  --   --   ALT 20  --   --   ALKPHOS 88  --   --   BILITOT 0.8  --   --    ------------------------------------------------------------------------------------------------------------------  Cardiac Enzymes Recent Labs  Lab 08/25/18 0913  TROPONINI 0.82*   ------------------------------------------------------------------------------------------------------------------  RADIOLOGY:  Ct Head Wo Contrast  Result Date: 08/25/2018 CLINICAL DATA:  Cardiac arrest with resuscitation. EXAM: CT HEAD WITHOUT CONTRAST CT CERVICAL SPINE WITHOUT CONTRAST TECHNIQUE: Multidetector CT imaging of the head and cervical spine was performed following the standard protocol without intravenous contrast. Multiplanar CT image reconstructions of the cervical spine were also generated. COMPARISON:  CT head 07/28/2018 FINDINGS: CT HEAD FINDINGS Brain: Mild atrophy without hydrocephalus. Moderate chronic white matter changes. No acute infarct, hemorrhage, or mass. Vascular: Atherosclerotic calcification. Negative for hyperdense vessel Skull: Negative Sinuses/Orbits: Negative Other: None CT CERVICAL SPINE FINDINGS Alignment: Normal Skull base and vertebrae: Negative for fracture Soft tissues and spinal canal: 10 mm nodule left upper pole of the thyroid. Atherosclerotic calcification carotid bifurcation bilaterally. No soft tissue mass or adenopathy Disc levels: Disc degeneration and spurring most prominent at C5-6 and C6-7 causing spinal and  foraminal stenosis bilaterally. Upper chest: Endotracheal tube and NG tube noted. Moderately large layering pleural effusions bilaterally with bibasilar atelectasis. Other: None IMPRESSION: 1. No acute intracranial abnormality. Atrophy and moderate chronic microvascular ischemic change 2. Negative for cervical spine fracture.  Cervical spondylosis 3. Moderately large bilateral pleural effusions. Electronically Signed   By: Franchot Gallo M.D.   On: 08/25/2018 10:11   Ct Cervical Spine Wo Contrast  Result Date: 08/25/2018 CLINICAL DATA:  Cardiac arrest with resuscitation. EXAM: CT HEAD WITHOUT CONTRAST CT CERVICAL SPINE WITHOUT CONTRAST TECHNIQUE: Multidetector CT imaging of the head and cervical spine was performed following the standard protocol without intravenous contrast. Multiplanar CT image reconstructions of the cervical spine were also generated. COMPARISON:  CT head 07/28/2018 FINDINGS: CT HEAD FINDINGS Brain: Mild atrophy without hydrocephalus. Moderate chronic white matter changes. No acute infarct, hemorrhage, or mass. Vascular: Atherosclerotic calcification. Negative for hyperdense vessel Skull: Negative Sinuses/Orbits: Negative Other: None CT CERVICAL SPINE FINDINGS Alignment: Normal Skull base and vertebrae: Negative for fracture Soft tissues and spinal canal: 10 mm nodule left upper pole of the thyroid. Atherosclerotic calcification carotid bifurcation bilaterally. No soft tissue  mass or adenopathy Disc levels: Disc degeneration and spurring most prominent at C5-6 and C6-7 causing spinal and foraminal stenosis bilaterally. Upper chest: Endotracheal tube and NG tube noted. Moderately large layering pleural effusions bilaterally with bibasilar atelectasis. Other: None IMPRESSION: 1. No acute intracranial abnormality. Atrophy and moderate chronic microvascular ischemic change 2. Negative for cervical spine fracture.  Cervical spondylosis 3. Moderately large bilateral pleural effusions.  Electronically Signed   By: Franchot Gallo M.D.   On: 08/25/2018 10:11   Dg Chest Portable 1 View  Result Date: 08/25/2018 CLINICAL DATA:  Cardiac arrest. EXAM: PORTABLE CHEST 1 VIEW COMPARISON:  07/10/2015 FINDINGS: Endotracheal tube tip is 6 cm above the carina. Orogastric or nasogastric tube enters the stomach. Heart is enlarged. There is widespread pulmonary density most consistent with edema. Aspiration not excluded. No visible effusion. IMPRESSION: Endotracheal tube and orogastric or nasogastric tube well positioned. Widespread pulmonary density probably representing edema. Some coexistent aspiration not excluded. Electronically Signed   By: Nelson Chimes M.D.   On: 08/25/2018 10:00   Dg Abd Portable 1 View  Result Date: 08/25/2018 CLINICAL DATA:  Cardiac arrest. EXAM: PORTABLE ABDOMEN - 1 VIEW COMPARISON:  None. FINDINGS: Orogastric tube has its tip in the mid body of the stomach. Gas pattern appears unremarkable. No abnormal calcifications. Ordinary degenerative changes affect the spine IMPRESSION: Orogastric tube tip in the mid body of the stomach Electronically Signed   By: Nelson Chimes M.D.   On: 08/25/2018 10:01     IMPRESSION AND PLAN:   78 yo with multiple medical issues with acute sudden cardiac arrest/sudden cardiac death  Severe ACUTE Hypoxic and Hypercapnic Respiratory Failure -continue Mechanical Ventilator support -continue Bronchodilator Therapy -Wean Fio2 and PEEP as tolerated   CARDIAC FAILURE-sudden cardiac death Follow up Cardiology RECS Not a candidate for CODE ICE due to prolonged CODE and patient has documented purposeful movements in ER   SEVERE ELECTROLYTE IMBALANCES Correct as needed  ELECTROLYTES -follow labs as needed -replace as needed -pharmacy consultation and following  ENDO - ICU hypoglycemic\Hyperglycemia protocol -check FSBS per protocol   NEUROLOGY - intubated and sedated - minimal sedation to achieve a RASS goal: -1  DVT/GI PRX  ordered TRANSFUSIONS AS NEEDED MONITOR FSBS ASSESS the need for LABS as needed   Critical Care Time devoted to patient care services described in this note is 43  minutes.   Overall, patient is critically ill, prognosis is guarded.  Patient with Multiorgan failure and at high risk for cardiac arrest and death.    Corrin Parker, M.D.  [facility]  Medical Director [place] Director [facility]

## 2018-08-25 NOTE — Progress Notes (Signed)
1520 Assisted tele visit to patient with wife and family members.  Maryelizabeth Rowan, RN

## 2018-08-25 NOTE — ED Notes (Signed)
Dr Kerman Passey preparing to intubate

## 2018-08-25 NOTE — ED Notes (Signed)
Daughter and wife at bedside to see pt.

## 2018-08-25 NOTE — ED Notes (Signed)
ED TO INPATIENT HANDOFF REPORT  ED Nurse Name and Phone #: Hulan Szumski [phone]  S Name/Age/Gender Gary Harmon 78 y.o. male Room/Bed: ED26A/ED26A  Code Status   Code Status: Prior  Home/SNF/Other Home Intubated and sedated   Triage Complete: Triage complete  Chief Complaint cardiac arrest ems  Triage Note Pt was shopping and had witnessed arrest.  Arrived EMS after 4 defib shocks, IO to left tib/fib, 6 mg epi total, 450 mg amiodarone, 1 amp bicarb, 1 gm calcium.  King airway in place from EMS. Last ems pressure 98 systolic  Pt arrived with pulse in NSR.     Allergies No Known Allergies  Level of Care/Admitting Diagnosis ED Disposition    None      B Medical/Surgery History Past Medical History:  Diagnosis Date  . Gout   . Hypertension   . Rheumatic fever    Past Surgical History:  Procedure Laterality Date  . CARDIAC CATHETERIZATION Right 10/13/2015   Procedure: Right/Left Heart Cath and Coronary Angiography;  Surgeon: Yolonda Kida, MD;  Location: [facility];  Service: Cardiovascular;  Laterality: Right;  . CATARACT EXTRACTION W/PHACO Left 09/26/2014   Procedure: CATARACT EXTRACTION PHACO AND INTRAOCULAR LENS PLACEMENT (IOC);  Surgeon: Estill Cotta, MD;  Location: [facility];  Service: Ophthalmology;  Laterality: Left;  [place] 01:45   . HAMMER TOE SURGERY       A IV Location/Drains/Wounds Patient Lines/Drains/Airways Status   Active Line/Drains/Airways    Name:   Placement date:   Placement time:   Site:   Days:   Peripheral IV 08/25/18 Left Antecubital   08/25/18    0910    Antecubital   less than 1   Peripheral IV 08/25/18 Right Hand   08/25/18    0936    Hand   less than 1   Peripheral IV 08/25/18 Right Antecubital   08/25/18    0940    Antecubital   less than 1   NG/OG Tube Orogastric 18 Fr. Center mouth Xray   08/25/18    0924    Center mouth   less than 1   Urethral Catheter anna rn Temperature probe 16 Fr.   08/25/18    0927    Temperature  probe   less than 1   Airway 7.5 mm   08/25/18    0916     less than 1          Intake/Output Last 24 hours No intake or output data in the 24 hours ending 08/25/18 1044  Labs/Imaging Results for orders placed or performed during the hospital encounter of 08/25/18 (from the past 48 hour(s))  CBC with Differential     Status: Abnormal   Collection Time: 08/25/18  9:13 AM  Result Value Ref Range   WBC 14.8 (H) 4.0 - 10.5 K/uL   RBC 3.51 (L) 4.22 - 5.81 MIL/uL   Hemoglobin 10.8 (L) 13.0 - 17.0 g/dL   HCT 34.8 (L) 39.0 - 52.0 %   MCV 99.1 80.0 - 100.0 fL   MCH 30.8 26.0 - 34.0 pg   MCHC 31.0 30.0 - 36.0 g/dL   RDW 13.4 11.5 - 15.5 %   Platelets 312 150 - 400 K/uL   nRBC 0.2 0.0 - 0.2 %   Neutrophils Relative % 58 %   Neutro Abs 8.7 (H) 1.7 - 7.7 K/uL   Lymphocytes Relative 29 %   Lymphs Abs 4.3 (H) 0.7 - 4.0 K/uL   Monocytes Relative 4 %  Monocytes Absolute 0.6 0.1 - 1.0 K/uL   Eosinophils Relative 2 %   Eosinophils Absolute 0.3 0.0 - 0.5 K/uL   Basophils Relative 1 %   Basophils Absolute 0.1 0.0 - 0.1 K/uL   WBC Morphology MORPHOLOGY UNREMARKABLE    RBC Morphology MORPHOLOGY UNREMARKABLE    Smear Review Normal platelet morphology    Immature Granulocytes 6 %   Abs Immature Granulocytes 0.88 (H) 0.00 - 0.07 K/uL    Comment: Performed at [facility], [address] [place], [place] [address]  Protime-INR     Status: Abnormal   Collection Time: 08/25/18  9:13 AM  Result Value Ref Range   Prothrombin Time 16.7 (H) 11.4 - 15.2 seconds   INR 1.4 (H) 0.8 - 1.2    Comment: (NOTE) INR goal varies based on device and disease states. Performed at [facility], [address] [place], [place] [address]   APTT     Status: Abnormal   Collection Time: 08/25/18  9:13 AM  Result Value Ref Range   aPTT 37 (H) 24 - 36 seconds    Comment:        IF BASELINE aPTT IS ELEVATED, SUGGEST PATIENT RISK ASSESSMENT BE USED TO DETERMINE APPROPRIATE ANTICOAGULANT  THERAPY. Performed at [facility], [address] [place], [place] [address]   Urinalysis, Complete w Microscopic     Status: Abnormal   Collection Time: 08/25/18  9:13 AM  Result Value Ref Range   Color, Urine YELLOW (A) YELLOW   APPearance CLEAR (A) CLEAR   Specific Gravity, Urine 1.010 1.005 - 1.030   pH 7.0 5.0 - 8.0   Glucose, UA NEGATIVE NEGATIVE mg/dL   Hgb urine dipstick SMALL (A) NEGATIVE   Bilirubin Urine NEGATIVE NEGATIVE   Ketones, ur NEGATIVE NEGATIVE mg/dL   Protein, ur 30 (A) NEGATIVE mg/dL   Nitrite NEGATIVE NEGATIVE   Leukocytes,Ua NEGATIVE NEGATIVE   RBC / HPF 0-5 0 - 5 RBC/hpf   WBC, UA 6-10 0 - 5 WBC/hpf   Bacteria, UA RARE (A) NONE SEEN   Squamous Epithelial / LPF 0-5 0 - 5   Mucus PRESENT     Comment: Performed at [facility], [address], [place], [place] [address]  SARS Coronavirus 2 (CEPHEID - Performed in [place] hospital lab), Hosp Order     Status: None   Collection Time: 08/25/18  9:13 AM  Result Value Ref Range   SARS Coronavirus 2 NEGATIVE NEGATIVE    Comment: (NOTE) If result is NEGATIVE SARS-CoV-2 target nucleic acids are NOT DETECTED. The SARS-CoV-2 RNA is generally detectable in upper and lower  respiratory specimens during the acute phase of infection. The lowest  concentration of SARS-CoV-2 viral copies this assay can detect is 250  copies / mL. A negative result does not preclude SARS-CoV-2 infection  and should not be used as the sole basis for treatment or other  patient management decisions.  A negative result may occur with  improper specimen collection / handling, submission of specimen other  than nasopharyngeal swab, presence of viral mutation(s) within the  areas targeted by this assay, and inadequate number of viral copies  (<250 copies / mL). A negative result must be combined with clinical  observations, patient history, and epidemiological information. If result is POSITIVE SARS-CoV-2 target  nucleic acids are DETECTED. The SARS-CoV-2 RNA is generally detectable in upper and lower  respiratory specimens dur ing the acute phase of infection.  Positive  results are  indicative of active infection with SARS-CoV-2.  Clinical  correlation with patient history and other diagnostic information is  necessary to determine patient infection status.  Positive results do  not rule out bacterial infection or co-infection with other viruses. If result is PRESUMPTIVE POSTIVE SARS-CoV-2 nucleic acids MAY BE PRESENT.   A presumptive positive result was obtained on the submitted specimen  and confirmed on repeat testing.  While 2019 novel coronavirus  (SARS-CoV-2) nucleic acids may be present in the submitted sample  additional confirmatory testing may be necessary for epidemiological  and / or clinical management purposes  to differentiate between  SARS-CoV-2 and other Sarbecovirus currently known to infect humans.  If clinically indicated additional testing with an alternate test  methodology [id number]) is advised. The SARS-CoV-2 RNA is generally  detectable in upper and lower respiratory sp ecimens during the acute  phase of infection. The expected result is Negative. Fact Sheet for Patients:  StrictlyIdeas.no Fact Sheet for Healthcare Providers: BankingDealers.co.za This test is not yet approved or cleared by the [place] FDA and has been authorized for detection and/or diagnosis of SARS-CoV-2 by FDA under an Emergency Use Authorization (EUA).  This EUA will remain in effect (meaning this test can be used) for the duration of the COVID-19 declaration under Section 564(b)(1) of the Act, 21 U.S.C. section 360bbb-3(b)(1), unless the authorization is terminated or revoked sooner. Performed at [facility], [place],  [address]   Glucose, capillary     Status: Abnormal   Collection Time: 08/25/18  9:21 AM   Result Value Ref Range   Glucose-Capillary 218 (H) 70 - 99 mg/dL   Comment 1 Notify RN    Ct Head Wo Contrast  Result Date: 08/25/2018 CLINICAL DATA:  Cardiac arrest with resuscitation. EXAM: CT HEAD WITHOUT CONTRAST CT CERVICAL SPINE WITHOUT CONTRAST TECHNIQUE: Multidetector CT imaging of the head and cervical spine was performed following the standard protocol without intravenous contrast. Multiplanar CT image reconstructions of the cervical spine were also generated. COMPARISON:  CT head 07/28/2018 FINDINGS: CT HEAD FINDINGS Brain: Mild atrophy without hydrocephalus. Moderate chronic white matter changes. No acute infarct, hemorrhage, or mass. Vascular: Atherosclerotic calcification. Negative for hyperdense vessel Skull: Negative Sinuses/Orbits: Negative Other: None CT CERVICAL SPINE FINDINGS Alignment: Normal Skull base and vertebrae: Negative for fracture Soft tissues and spinal canal: 10 mm nodule left upper pole of the thyroid. Atherosclerotic calcification carotid bifurcation bilaterally. No soft tissue mass or adenopathy Disc levels: Disc degeneration and spurring most prominent at C5-6 and C6-7 causing spinal and foraminal stenosis bilaterally. Upper chest: Endotracheal tube and NG tube noted. Moderately large layering pleural effusions bilaterally with bibasilar atelectasis. Other: None IMPRESSION: 1. No acute intracranial abnormality. Atrophy and moderate chronic microvascular ischemic change 2. Negative for cervical spine fracture.  Cervical spondylosis 3. Moderately large bilateral pleural effusions. Electronically Signed   By: Franchot Gallo M.D.   On: 08/25/2018 10:11   Ct Cervical Spine Wo Contrast  Result Date: 08/25/2018 CLINICAL DATA:  Cardiac arrest with resuscitation. EXAM: CT HEAD WITHOUT CONTRAST CT CERVICAL SPINE WITHOUT CONTRAST TECHNIQUE: Multidetector CT imaging of the head and cervical spine was performed following the standard protocol without intravenous contrast.  Multiplanar CT image reconstructions of the cervical spine were also generated. COMPARISON:  CT head 07/28/2018 FINDINGS: CT HEAD FINDINGS Brain: Mild atrophy without hydrocephalus. Moderate chronic white matter changes. No acute infarct, hemorrhage, or mass. Vascular: Atherosclerotic calcification. Negative for hyperdense vessel Skull: Negative Sinuses/Orbits: Negative Other: None CT CERVICAL  SPINE FINDINGS Alignment: Normal Skull base and vertebrae: Negative for fracture Soft tissues and spinal canal: 10 mm nodule left upper pole of the thyroid. Atherosclerotic calcification carotid bifurcation bilaterally. No soft tissue mass or adenopathy Disc levels: Disc degeneration and spurring most prominent at C5-6 and C6-7 causing spinal and foraminal stenosis bilaterally. Upper chest: Endotracheal tube and NG tube noted. Moderately large layering pleural effusions bilaterally with bibasilar atelectasis. Other: None IMPRESSION: 1. No acute intracranial abnormality. Atrophy and moderate chronic microvascular ischemic change 2. Negative for cervical spine fracture.  Cervical spondylosis 3. Moderately large bilateral pleural effusions. Electronically Signed   By: Franchot Gallo M.D.   On: 08/25/2018 10:11   Dg Chest Portable 1 View  Result Date: 08/25/2018 CLINICAL DATA:  Cardiac arrest. EXAM: PORTABLE CHEST 1 VIEW COMPARISON:  07/10/2015 FINDINGS: Endotracheal tube tip is 6 cm above the carina. Orogastric or nasogastric tube enters the stomach. Heart is enlarged. There is widespread pulmonary density most consistent with edema. Aspiration not excluded. No visible effusion. IMPRESSION: Endotracheal tube and orogastric or nasogastric tube well positioned. Widespread pulmonary density probably representing edema. Some coexistent aspiration not excluded. Electronically Signed   By: Nelson Chimes M.D.   On: 08/25/2018 10:00   Dg Abd Portable 1 View  Result Date: 08/25/2018 CLINICAL DATA:  Cardiac arrest. EXAM: PORTABLE  ABDOMEN - 1 VIEW COMPARISON:  None. FINDINGS: Orogastric tube has its tip in the mid body of the stomach. Gas pattern appears unremarkable. No abnormal calcifications. Ordinary degenerative changes affect the spine IMPRESSION: Orogastric tube tip in the mid body of the stomach Electronically Signed   By: Nelson Chimes M.D.   On: 08/25/2018 10:01    Pending Labs Unresulted Labs (From admission, onward)    Start     Ordered   08/25/18 1037  Potassium  ONCE - STAT,   STAT     08/25/18 1036   08/25/18 1036  Magnesium  Add-on,   AD     08/25/18 1035   08/25/18 1030  Blood gas, arterial  Once,   STAT    Question:  Room air or oxygen  Answer:  Oxygen   08/25/18 0935   08/25/18 0912  Comprehensive metabolic panel  ONCE - STAT,   STAT     08/25/18 0912   08/25/18 0912  Troponin I - ONCE - STAT  ONCE - STAT,   STAT     08/25/18 0912          Vitals/Pain Today's Vitals   08/25/18 1000 08/25/18 1005 08/25/18 1010 08/25/18 1015  BP: 119/75   134/86  Pulse: (!) 102 (!) 103 98 99  Resp: (!) 21 (!) 22 (!) 21 20  Temp: (!) 96.4 F (35.8 C) (!) 96.4 F (35.8 C) (!) 96.6 F (35.9 C) (!) 96.7 F (35.9 C)  SpO2: 94% 97% 97% 99%  Weight:      Height:        Isolation Precautions No active isolations  Medications Medications  propofol (DIPRIVAN) 1000 MG/100ML infusion (10 mcg/kg/min  86.2 kg Intravenous New Bag/Given 08/25/18 0926)  calcium gluconate 10 % injection (has no administration in time range)  etomidate (AMIDATE) injection 10 mg (10 mg Intravenous Given by Other 08/25/18 0913)  rocuronium (ZEMURON) injection 100 mg (100 mg Intravenous Given by Other 08/25/18 0914)  calcium gluconate 1 g in sodium chloride 0.9 % 100 mL IVPB (0 g Intravenous Stopped 08/25/18 1040)  sodium chloride 0.9 % bolus 1,000 mL (1,000 mLs Intravenous New Bag/Given  08/25/18 9122)    Mobility walks High fall risk   Focused Assessments Cardiac Assessment Handoff:  Cardiac Rhythm: Sinus tachycardia Lab  Results  Component Value Date   TROPONINI 0.22 (H) 07/11/2015   No results found for: DDIMER Does the Patient currently have chest pain? NA , Neuro Assessment Handoff:  Swallow screen pass? intubated Cardiac Rhythm: Sinus tachycardia       Neuro Assessment: Exceptions to WDL(abrasion to back of head.) Neuro Checks:      Last Documented NIHSS Modified Score:   Has TPA been given? No If patient is a Neuro Trauma and patient is going to OR before floor call report to [place] nurse: [phone] or [phone]     R Recommendations: See Admitting Provider Note  Report given to:   Additional Notes:  Family at bedside.

## 2018-08-25 NOTE — ED Notes (Signed)
Placed on zoll pads

## 2018-08-25 NOTE — Progress Notes (Signed)
   08/25/18 1200  Clinical Encounter Type  Visited With Patient and family together;Health care provider  Visit Type Initial  Referral From Nurse  Spiritual Encounters  Spiritual Needs Prayer;Emotional  Stress Factors  Family Stress Factors Major life changes  Ch received a page for family support. Pt had a cardiac arrest and the family (Wife, Daughter) were waiting in the family room. Wife and Yolanda Bonine work for [employer]. Son Remo Lipps arrived shortly. Ch provided a caring presence and offered a prayer for healing. Ch will follow up with the family as the pt gets moved to ICU.

## 2018-08-25 NOTE — Progress Notes (Signed)
   08/25/18 1254  Clinical Encounter Type  Visited With Family  Visit Type Follow-up  Ch called the wife and daughter to follow up. Pt in ICU and family were sent home. Both family members are taking the situation as best as they can. Ch will continue to follow up with the daughter.

## 2018-08-25 NOTE — ED Provider Notes (Addendum)
[facility] Emergency Department Provider Note  Time seen: 9:47 AM  I have reviewed the triage vital signs and the nursing notes.   HISTORY  Chief Complaint Cardiac Arrest   HPI Gary Harmon is a 78 y.o. male with a past medical history of hypertension, CHF, CKD, presents to the emergency department after a cardiac arrest.  According to EMS the patient had a witnessed cardiac arrest at a store.  Immediate CPR.  Upon EMS arrival patient was alternating between V. fib and PEA arrest.  Patient was shocked a total of 4 times per EMS.  Patient has a left tibia IO.  Had received a total of 6 mg of epinephrine, 450 mg of amiodarone and amp of bicarb and amp of calcium.  King airway in place upon arrival with equal breath sounds.  Upon arrival patient is minimally responsive however will occasionally reach for the [facility] airway tube showing signs of purposeful movement.   Past Medical History:  Diagnosis Date  . Gout   . Hypertension   . Rheumatic fever     Patient Active Problem List   Diagnosis Date Noted  . Acute CHF ([place]) 07/10/2015    Past Surgical History:  Procedure Laterality Date  . CARDIAC CATHETERIZATION Right 10/13/2015   Procedure: Right/Left Heart Cath and Coronary Angiography;  Surgeon: Yolonda Kida, MD;  Location: [place] [facility];  Service: Cardiovascular;  Laterality: Right;  . CATARACT EXTRACTION W/PHACO Left 09/26/2014   Procedure: CATARACT EXTRACTION PHACO AND INTRAOCULAR LENS PLACEMENT (IOC);  Surgeon: Estill Cotta, MD;  Location: [facility] OR[facility];  Service: Ophthalmology;  Laterality: Left;  [place] 01:45   . HAMMER TOE SURGERY      Prior to Admission medications   Medication Sig Start Date End Date Taking? Authorizing Provider  acetaminophen (TYLENOL) 325 MG tablet Take 650 mg by mouth every 6 (six) hours as needed for mild pain.     [provider]  allopurinol (ZYLOPRIM) 100 MG tablet Take 1 tablet (100 mg total) by mouth 2 (two)  times daily. Patient taking differently: Take 100 mg by mouth daily.  09/29/14   Hyatt, Max T, DPM  ALPRAZolam Duanne Moron) 0.5 MG tablet Take 0.5 mg by mouth at bedtime as needed for sleep. 01/05/16   [provider]  aspirin EC 81 MG tablet Take 81 mg by mouth daily.    [provider]  benazepril (LOTENSIN) 20 MG tablet Take 20 mg by mouth 2 (two) times daily.     [provider]  citalopram (CELEXA) 10 MG tablet Take 10 mg by mouth daily. 06/01/18   [provider]  furosemide (LASIX) 20 MG tablet Take 1 tablet (20 mg total) by mouth daily. Patient not taking: Reported on 10/11/2015 07/12/15   Hower, Aaron Mose, MD  meclizine (ANTIVERT) 25 MG tablet Take 1 tablet (25 mg total) by mouth 3 (three) times daily as needed for dizziness. 07/28/18   Eula Listen, MD  metoprolol succinate (TOPROL-XL) 100 MG 24 hr tablet Take 100 mg by mouth daily.    [provider]  potassium chloride SA (K-DUR,KLOR-CON) 20 MEQ tablet Take 1 tablet (20 mEq total) by mouth daily. 07/12/15   Hower, Aaron Mose, MD  pravastatin (PRAVACHOL) 10 MG tablet Take 10 mg by mouth Nightly. 06/08/18 06/08/19  [provider]  sodium zirconium cyclosilicate (LOKELMA) 10 g PACK packet Take 10 g by mouth 2 (two) times daily. 07/28/18   Eula Listen, MD  spironolactone (ALDACTONE) 25 MG tablet  Take 12.5 mg by mouth daily. 10/08/17 10/08/18  [provider]  torsemide (DEMADEX) 20 MG tablet Take 20 mg by mouth daily.    [provider]    No Known Allergies  Family History  Problem Relation Age of Onset  . Hypertension Other     Social History Social History   Tobacco Use  . Smoking status: Never Smoker  . Smokeless tobacco: Never Used  Substance Use Topics  . Alcohol use: No    Alcohol/week: 0.0 standard drinks  . Drug use: No    Review of Systems Unable to obtain a review of systems secondary to cardiac arrest,  intubation ____________________________________________   PHYSICAL EXAM:  VITAL SIGNS: ED Triage Vitals  Enc Vitals Group     BP 08/25/18 0923 (!) 143/98     Pulse Rate 08/25/18 0923 93     Resp 08/25/18 0923 (!) 21     Temp 08/25/18 0932 (!) 96.4 F (35.8 C)     Temp src --      SpO2 08/25/18 0923 91 %     Weight 08/25/18 0910 190 lb (86.2 kg)     Height 08/25/18 0910 5\' 7"  (1.702 m)     Head Circumference --      Peak Flow --      Pain Score --      Pain Loc --      Pain Edu? --      Excl. in [place]? --    Constitutional: Patient arrives with a King airway in place.  At times does appear to make minimal purposeful movements such as reaching for the [facility] airway tube.  Is not responding to commands. Eyes: 2 to 3 mm pupils bilaterally, both appear reactive although somewhat sluggish. ENT      Head: Normocephalic       Mouth/Throat: Mucous membranes are moist. Cardiovascular: Normal rate, regular rhythm. Respiratory: Equal breath sounds bilaterally with bagging. Gastrointestinal: Soft, no obvious abdominal trauma.  No distention. Musculoskeletal: IO catheter in left tibia.  Extremities appear otherwise largely atraumatic. Neurologic: Patient is unresponsive, King airway in place.  Does appear to make purposeful movements at times.  Unable to complete an adequate neurological exam secondary to current status. Skin:  Skin is warm, dry Psychiatric: Unable to evaluate psychiatrically secondary to minimal responsiveness.  ____________________________________________    EKG  EKG viewed and interpreted by myself shows a sinus rhythm at 63 bpm with a widened QRS, normal axis, largely normal intervals nonspecific ST changes.  ____________________________________________    RADIOLOGY  CT head and C-spine are negative for acute abnormality. Chest x-ray shows well-positioned tubes, possible pulmonary edema.  ____________________________________________   INITIAL IMPRESSION /  ASSESSMENT AND PLAN / ED COURSE  Pertinent labs & imaging results that were available during my care of the patient were reviewed by me and considered in my medical decision making (see chart for details).   Patient presents to the emergency department status post cardiac arrest.  Multiple rounds of CPR multiple medications and defibrillations performed by EMS prior to arrival.  Upon arrival patient has a spontaneous pulse.  Appeared to make occasional purposeful movement such as grabbing the [facility] airway.  King airway was exchanged for an ET tube upon arrival.  Labs have been sent.  Patient's blood pressure is stable we will use propofol for sedation.  In reviewing the patient's records he has chronic kidney disease with a recent elevated potassium is high at 6.2.  At this time I  would suspect either hyperkalemia leading to cardiac arrest or CHF leading to cardiac arrest possibly ACS.  EKG does not appear to show a complete blockage at this time.  Labs are pending.  We will dose calcium chloride via piggyback while awaiting lab results in case the arrest was due to hyperkalemia.  We will also send a corona swab in anticipation for admission.  Patient's labs are resulting showing a significant hypokalemia.  Patient stopped his potassium supplements, takes low [place], it is possible that the patient has significantly dropped his potassium over the past 1 month which could lead to the patient's cardiac arrest today.  We will start IV potassium for the patient.  Magnesium level has resulted 0.8 we will start IV magnesium for the patient.  Troponin is elevated likely secondary to cardiac arrest, I believe less likely due to ACS in the setting of severe hypo-kalemia and hypomagnesemia.  Patient will be admitted to the ICU.  CRITICAL CARE Performed by: Harvest Dark   Total critical care time: 45 minutes  Critical care time was exclusive of separately billable procedures and treating other  patients.  Critical care was necessary to treat or prevent imminent or life-threatening deterioration.  Critical care was time spent personally by me on the following activities: development of treatment plan with patient and/or surrogate as well as nursing, discussions with consultants, evaluation of patient's response to treatment, examination of patient, obtaining history from patient or surrogate, ordering and performing treatments and interventions, ordering and review of laboratory studies, ordering and review of radiographic studies, pulse oximetry and re-evaluation of patient's condition.   ____________________________________________   FINAL CLINICAL IMPRESSION(S) / ED DIAGNOSES  Status post cardiac arrest Cardiac resuscitation, successful Hypokalemia Hypomagnesemia   Harvest Dark, MD 08/25/18 1104    Harvest Dark, MD 08/25/18 1159

## 2018-08-25 NOTE — ED Triage Notes (Addendum)
Pt was shopping and had witnessed arrest.  Arrived EMS after 4 defib shocks, IO to left tib/fib, 6 mg epi total, 450 mg amiodarone, 1 amp bicarb, 1 gm calcium.  King airway in place from EMS. Last ems pressure 98 systolic  Pt arrived with pulse in NSR.

## 2018-08-25 NOTE — ED Notes (Signed)
Pt remains with palpable radial pulse.

## 2018-08-25 NOTE — ED Notes (Signed)
Pt remains to have palpable radial pulse. Dr Kerman Passey at bedside to update family.

## 2018-08-25 NOTE — ED Notes (Signed)
Dr Kerman Passey aware of all critical K and Mg levels

## 2018-08-25 NOTE — Consult Note (Signed)
Cardiology Consultation Note    Patient ID: Gary Harmon, MRN: [id number], DOB/AGE: 1941/01/19 78 y.o. Admit date: 08/25/2018   Date of Consult: 08/25/2018 Primary Physician: Valera Castle, MD Primary Cardiologist: Dr. Clayborn Bigness  Chief Complaint: syncope Reason for Consultation: cardiac arrest Requesting MD: Dr. Fritzi Mandes  HPI: Gary Harmon is a 78 y.o. male with history of known heart failure with reduced ejection fraction, chronic kidney disease stage III, hypertension and gout who presented to the emergency room after suffering cardiac arrest while shopping at a local supermarket.  Patient was in his usual state of health felt well this morning when while at check out he collapsed.  Immediate CPR was initiated at the store.  Upon EMS arrival he was noted to be in V. fib/PEA.  He was shocked 4 times and he received 6 mg of epinephrine via interosseous line 450 mg of amiodarone bicarb and calcium.  He was intubated and transported to the emergency room.  He was not felt to be a code ice candidate.  He is laboratories showed a serum potassium of less than 2.  Echocardiogram on arrival at the emergency room showed sinus tachycardia.  Brain CT was unremarkable.  He was remained on intubation given IV propofol and IV potassium.  Initial serum troponin was 0.82.  His troponin was 0.22 and flat on a previous admission.  He was COVID negative.  He was also given IV magnesium.  Subsequent potassium level remained at less than 2.  He is minimally responsive moving all 4 quadrants.  He is not able to give a history as he is intubated.  Past Medical History:  Diagnosis Date  . Gout   . Hypertension   . Rheumatic fever       Surgical History:  Past Surgical History:  Procedure Laterality Date  . CARDIAC CATHETERIZATION Right 10/13/2015   Procedure: Right/Left Heart Cath and Coronary Angiography;  Surgeon: Yolonda Kida, MD;  Location: [facility];  Service: Cardiovascular;   Laterality: Right;  . CATARACT EXTRACTION W/PHACO Left 09/26/2014   Procedure: CATARACT EXTRACTION PHACO AND INTRAOCULAR LENS PLACEMENT (IOC);  Surgeon: Estill Cotta, MD;  Location: [facility];  Service: Ophthalmology;  Laterality: Left;  [place] 01:45   . HAMMER TOE SURGERY       Home Meds: Prior to Admission medications   Medication Sig Start Date End Date Taking? Authorizing Provider  acetaminophen (TYLENOL) 325 MG tablet Take 650 mg by mouth every 6 (six) hours as needed for mild pain.    Yes [provider]  allopurinol (ZYLOPRIM) 100 MG tablet Take 1 tablet (100 mg total) by mouth 2 (two) times daily. Patient taking differently: Take 100 mg by mouth daily.  09/29/14  Yes Hyatt, Max T, DPM  ALPRAZolam Duanne Moron) 0.5 MG tablet Take 0.5 mg by mouth at bedtime as needed for sleep. 01/05/16  Yes [provider]  aspirin EC 81 MG tablet Take 81 mg by mouth daily.   Yes [provider]  benazepril (LOTENSIN) 20 MG tablet Take 20 mg by mouth 2 (two) times daily.    Yes [provider]  citalopram (CELEXA) 10 MG tablet Take 10 mg by mouth daily. 06/01/18  Yes [provider]  metoprolol succinate (TOPROL-XL) 100 MG 24 hr tablet Take 100 mg by mouth daily.   Yes [provider]  pravastatin (PRAVACHOL) 10 MG tablet Take 10 mg by mouth daily.  06/08/18 06/08/19 Yes [provider]  sodium zirconium cyclosilicate (LOKELMA)  10 g PACK packet Take 10 g by mouth 2 (two) times daily. Patient taking differently: Take 10 g by mouth daily.  07/28/18  Yes Eula Listen, MD  torsemide (DEMADEX) 20 MG tablet Take 20 mg by mouth daily.   Yes [provider]    Inpatient Medications:  . calcium gluconate      . heparin  5,000 Units Subcutaneous Q8H   . potassium chloride 10 mEq (08/25/18 1539)  . propofol (DIPRIVAN) infusion Stopped (08/25/18 1300)    Allergies: No Known Allergies  Social History   Socioeconomic History  . Marital  status: Married    Spouse name: Not on file  . Number of children: Not on file  . Years of education: Not on file  . Highest education level: Not on file  Occupational History  . Not on file  Social Needs  . Financial resource strain: Not on file  . Food insecurity:    Worry: Not on file    Inability: Not on file  . Transportation needs:    Medical: Not on file    Non-medical: Not on file  Tobacco Use  . Smoking status: Never Smoker  . Smokeless tobacco: Never Used  Substance and Sexual Activity  . Alcohol use: No    Alcohol/week: 0.0 standard drinks  . Drug use: No  . Sexual activity: Not on file  Lifestyle  . Physical activity:    Days per week: Not on file    Minutes per session: Not on file  . Stress: Not on file  Relationships  . Social connections:    Talks on phone: Not on file    Gets together: Not on file    Attends religious service: Not on file    Active member of club or organization: Not on file    Attends meetings of clubs or organizations: Not on file    Relationship status: Not on file  . Intimate partner violence:    Fear of current or ex partner: Not on file    Emotionally abused: Not on file    Physically abused: Not on file    Forced sexual activity: Not on file  Other Topics Concern  . Not on file  Social History Narrative  . Not on file     Family History  Problem Relation Age of Onset  . Hypertension Other      Review of Systems: A 12-system review of systems was performed and is negative except as noted in the HPI.  Labs: Recent Labs    08/25/18 0913  TROPONINI 0.82*   Lab Results  Component Value Date   WBC 14.8 (H) 08/25/2018   HGB 10.8 (L) 08/25/2018   HCT 34.8 (L) 08/25/2018   MCV 99.1 08/25/2018   PLT 312 08/25/2018    Recent Labs  Lab 08/25/18 0913  08/25/18 1300  NA 140  --   --   K <2.0*   < > 2.4*  CL 91*  --   --   CO2 21*  --   --   BUN 21  --   --   CREATININE 1.70*  --   --   CALCIUM 10.6*  --   --    PROT 6.5  --   --   BILITOT 0.8  --   --   ALKPHOS 88  --   --   ALT 20  --   --   AST 52*  --   --   GLUCOSE  310*  --   --    < > = values in this interval not displayed.   No results found for: CHOL, HDL, LDLCALC, TRIG No results found for: DDIMER  Radiology/Studies:  Ct Head Wo Contrast  Result Date: 08/25/2018 CLINICAL DATA:  Cardiac arrest with resuscitation. EXAM: CT HEAD WITHOUT CONTRAST CT CERVICAL SPINE WITHOUT CONTRAST TECHNIQUE: Multidetector CT imaging of the head and cervical spine was performed following the standard protocol without intravenous contrast. Multiplanar CT image reconstructions of the cervical spine were also generated. COMPARISON:  CT head 07/28/2018 FINDINGS: CT HEAD FINDINGS Brain: Mild atrophy without hydrocephalus. Moderate chronic white matter changes. No acute infarct, hemorrhage, or mass. Vascular: Atherosclerotic calcification. Negative for hyperdense vessel Skull: Negative Sinuses/Orbits: Negative Other: None CT CERVICAL SPINE FINDINGS Alignment: Normal Skull base and vertebrae: Negative for fracture Soft tissues and spinal canal: 10 mm nodule left upper pole of the thyroid. Atherosclerotic calcification carotid bifurcation bilaterally. No soft tissue mass or adenopathy Disc levels: Disc degeneration and spurring most prominent at C5-6 and C6-7 causing spinal and foraminal stenosis bilaterally. Upper chest: Endotracheal tube and NG tube noted. Moderately large layering pleural effusions bilaterally with bibasilar atelectasis. Other: None IMPRESSION: 1. No acute intracranial abnormality. Atrophy and moderate chronic microvascular ischemic change 2. Negative for cervical spine fracture.  Cervical spondylosis 3. Moderately large bilateral pleural effusions. Electronically Signed   By: Franchot Gallo M.D.   On: 08/25/2018 10:11   Ct Head Wo Contrast  Result Date: 07/28/2018 CLINICAL DATA:  Acute onset of dizziness and falling today. EXAM: CT HEAD WITHOUT CONTRAST  TECHNIQUE: Contiguous axial images were obtained from the base of the skull through the vertex without intravenous contrast. COMPARISON:  None. FINDINGS: Brain: No sign of acute infarction by CT. There chronic small-vessel ischemic changes of the pons and throughout the cerebral hemispheric white matter. No cortical or large vessel territory infarction. No mass lesion, hemorrhage, hydrocephalus or extra-axial collection. Vascular: There is atherosclerotic calcification of the major vessels at the base of the brain. Skull: Negative Sinuses/Orbits: Clear/normal Other: None IMPRESSION: No acute finding by CT. Chronic small-vessel ischemic changes throughout the brain, often seen at this age. Electronically Signed   By: Nelson Chimes M.D.   On: 07/28/2018 15:38   Ct Cervical Spine Wo Contrast  Result Date: 08/25/2018 CLINICAL DATA:  Cardiac arrest with resuscitation. EXAM: CT HEAD WITHOUT CONTRAST CT CERVICAL SPINE WITHOUT CONTRAST TECHNIQUE: Multidetector CT imaging of the head and cervical spine was performed following the standard protocol without intravenous contrast. Multiplanar CT image reconstructions of the cervical spine were also generated. COMPARISON:  CT head 07/28/2018 FINDINGS: CT HEAD FINDINGS Brain: Mild atrophy without hydrocephalus. Moderate chronic white matter changes. No acute infarct, hemorrhage, or mass. Vascular: Atherosclerotic calcification. Negative for hyperdense vessel Skull: Negative Sinuses/Orbits: Negative Other: None CT CERVICAL SPINE FINDINGS Alignment: Normal Skull base and vertebrae: Negative for fracture Soft tissues and spinal canal: 10 mm nodule left upper pole of the thyroid. Atherosclerotic calcification carotid bifurcation bilaterally. No soft tissue mass or adenopathy Disc levels: Disc degeneration and spurring most prominent at C5-6 and C6-7 causing spinal and foraminal stenosis bilaterally. Upper chest: Endotracheal tube and NG tube noted. Moderately large layering pleural  effusions bilaterally with bibasilar atelectasis. Other: None IMPRESSION: 1. No acute intracranial abnormality. Atrophy and moderate chronic microvascular ischemic change 2. Negative for cervical spine fracture.  Cervical spondylosis 3. Moderately large bilateral pleural effusions. Electronically Signed   By: Franchot Gallo M.D.   On: 08/25/2018 10:11  US Renal  Result Date: 07/30/2018 CLINICAL DATA:  Acute renal failure with chronic kidney disease stage 3 EXAM: RENAL / URINARY TRACT ULTRASOUND COMPLETE COMPARISON:  None. FINDINGS: Right Kidney: Renal measurements: 7.7 x 4.0 x 3.4 cm = volume: 54 mL. Negative for hydronephrosis. Cortical thinning with small renal volume. Left Kidney: Renal measurements: 10.6 x 4.8 x 9.4 cm = volume: 117 mL. Echogenicity within normal limits. No mass or hydronephrosis visualized. Bladder: Enlarged prostate projecting into the base of the bladder. Bladder volume normal. No significant bladder wall thickening. IMPRESSION: Negative for hydronephrosis.  Small volume right kidney 54 mL Significant enlargement the prostate projecting into the bladder. Electronically Signed   By: Franchot Gallo M.D.   On: 07/30/2018 14:45   Dg Chest Portable 1 View  Result Date: 08/25/2018 CLINICAL DATA:  Cardiac arrest. EXAM: PORTABLE CHEST 1 VIEW COMPARISON:  07/10/2015 FINDINGS: Endotracheal tube tip is 6 cm above the carina. Orogastric or nasogastric tube enters the stomach. Heart is enlarged. There is widespread pulmonary density most consistent with edema. Aspiration not excluded. No visible effusion. IMPRESSION: Endotracheal tube and orogastric or nasogastric tube well positioned. Widespread pulmonary density probably representing edema. Some coexistent aspiration not excluded. Electronically Signed   By: Nelson Chimes M.D.   On: 08/25/2018 10:00   Dg Abd Portable 1 View  Result Date: 08/25/2018 CLINICAL DATA:  Cardiac arrest. EXAM: PORTABLE ABDOMEN - 1 VIEW COMPARISON:  None. FINDINGS:  Orogastric tube has its tip in the mid body of the stomach. Gas pattern appears unremarkable. No abnormal calcifications. Ordinary degenerative changes affect the spine IMPRESSION: Orogastric tube tip in the mid body of the stomach Electronically Signed   By: Nelson Chimes M.D.   On: 08/25/2018 10:01   Vas US Renal Artery Duplex  Result Date: 08/18/2018 ABDOMINAL VISCERAL Indications: Chronic kidney disease. Limitations: Air/bowel gas. Performing Technologist: Charlane Ferretti RT (R)(VS)  Examination Guidelines: A complete evaluation includes B-mode imaging, spectral Doppler, color Doppler, and power Doppler as needed of all accessible portions of each vessel. Bilateral testing is considered an integral part of a complete examination. Limited examinations for reoccurring indications may be performed as noted.  Duplex Findings: +------------------+--------+--------+-------+ Right Renal ArteryPSV cm/sEDV cm/sComment +------------------+--------+--------+-------+ Origin              110                   +------------------+--------+--------+-------+ Proximal             58                   +------------------+--------+--------+-------+ Mid                  83      19           +------------------+--------+--------+-------+ Distal               98                   +------------------+--------+--------+-------+ +-----------------+--------+--------+-------+ Left Renal ArteryPSV cm/sEDV cm/sComment +-----------------+--------+--------+-------+ Origin             102                   +-----------------+--------+--------+-------+ Proximal           172      29           +-----------------+--------+--------+-------+ Mid  192                   +-----------------+--------+--------+-------+ Distal              17      4            +-----------------+--------+--------+-------+  +------------------+-----+------------------+-----+ Right Kidney           Left  Kidney             +------------------+-----+------------------+-----+ RAR (manual)      1.66 RAR (manual)      3.31  +------------------+-----+------------------+-----+ Kidney length (cm)10.00Kidney length (cm)11.00 +------------------+-----+------------------+-----+  Summary: Renal:  Right: Normal size right kidney. No evidence of right renal artery        stenosis. Left:  Normal size of left kidney. 1-59% stenosis of the left renal        artery.         Bilateral free fluid visualized along the lateral borders.  *See table(s) above for measurements and observations.  Diagnosing physician: Leotis Pain MD  Electronically signed by Leotis Pain MD on 08/18/2018 at 10:38:06 AM.    Final     Wt Readings from Last 3 Encounters:  08/25/18 89.2 kg  07/28/18 86.2 kg  06/22/18 80.3 kg    EKG: Sinus tachycardia with nonspecific ST-T wave changes  Physical Exam: Intubated Caucasian male in no acute distress Blood pressure 95/78, pulse 83, temperature (!) 97.3 F (36.3 C), resp. rate 18, height 5\' 7"  (1.702 m), weight 89.2 kg, SpO2 99 %. Body mass index is 30.8 kg/m. General: Well developed, well nourished, in no acute distress. Head: Normocephalic, atraumatic, sclera non-icteric, no xanthomas, nares are without discharge.  Neck: Negative for carotid bruits. JVD not elevated. Lungs: Clear bilaterally to auscultation without wheezes, rales, or rhonchi. Breathing is unlabored. Heart: RRR with S1 S2. No murmurs, rubs, or gallops appreciated. Abdomen: Soft, non-tender, non-distended with normoactive bowel sounds. No hepatomegaly. No rebound/guarding. No obvious abdominal masses. Msk:  Strength and tone appear normal for age. Extremities: No clubbing or cyanosis. No edema.  Distal pedal pulses are 2+ and equal bilaterally. Neuro: Intubated but appears to have purposeful motions with commands.      Assessment and Plan  78 year old male with history of chronic systolic heart failure with a known  ejection fraction of 35 to 40% although is been able to be fairly active.  As an outpatient he has been treated with benazepril, metoprolol, pravastatin, frontal lactone and torsemide.  Etiology of the event likely was due to profound hypokalemia.  Would agree with resuscitation of his magnesium potassium following for evidence of arrhythmia.  Elevated troponin likely secondary to cardiac arrest and CPR.  Will follow.  Had a nonischemic cardiomyopathy.  This does not appear to be an ACS event.  Will follow with you and further recommendations based on his course.  Appreciate nephrology input as to determine the etiology of his hypokalemia.  Patient is on a diuretic but is also on ACE inhibitor and spironolactone and a potassium supplement.  May need to determine if his furosemide or torsemide is being taken appropriately.  Signed, Teodoro Spray MD 08/25/2018, 3:54 PM Pager: [phone]

## 2018-08-26 ENCOUNTER — Inpatient Hospital Stay: Payer: Medicare Other

## 2018-08-26 ENCOUNTER — Inpatient Hospital Stay
Admit: 2018-08-26 | Discharge: 2018-08-26 | Disposition: A | Discharge status: Home / Self Care | Payer: Medicare Other | Attending: Pulmonary Disease | Admitting: Pulmonary Disease

## 2018-08-26 LAB — CBC WITH DIFFERENTIAL/PLATELET
Abs Immature Granulocytes: 0.1 10*3/uL — ABNORMAL HIGH (ref 0.00–0.07)
Basophils Absolute: 0 10*3/uL (ref 0.0–0.1)
Basophils Relative: 0 %
Eosinophils Absolute: 0 10*3/uL (ref 0.0–0.5)
Eosinophils Relative: 0 %
HCT: 33.5 % — ABNORMAL LOW (ref 39.0–52.0)
Hemoglobin: 10.7 g/dL — ABNORMAL LOW (ref 13.0–17.0)
Immature Granulocytes: 1 %
Lymphocytes Relative: 8 %
Lymphs Abs: 1 10*3/uL (ref 0.7–4.0)
MCH: 30.5 pg (ref 26.0–34.0)
MCHC: 31.9 g/dL (ref 30.0–36.0)
MCV: 95.4 fL (ref 80.0–100.0)
Monocytes Absolute: 0.8 10*3/uL (ref 0.1–1.0)
Monocytes Relative: 6 %
Neutro Abs: 10.6 10*3/uL — ABNORMAL HIGH (ref 1.7–7.7)
Neutrophils Relative %: 85 %
Platelets: 319 10*3/uL (ref 150–400)
RBC: 3.51 MIL/uL — ABNORMAL LOW (ref 4.22–5.81)
RDW: 13.6 % (ref 11.5–15.5)
WBC: 12.5 10*3/uL — ABNORMAL HIGH (ref 4.0–10.5)
nRBC: 0 % (ref 0.0–0.2)

## 2018-08-26 LAB — URINALYSIS, COMPLETE (UACMP) WITH MICROSCOPIC
Bilirubin Urine: NEGATIVE
Glucose, UA: NEGATIVE mg/dL
Ketones, ur: NEGATIVE mg/dL
Nitrite: NEGATIVE
Protein, ur: 100 mg/dL — AB
Specific Gravity, Urine: 1.013 (ref 1.005–1.030)
pH: 5 (ref 5.0–8.0)

## 2018-08-26 LAB — BASIC METABOLIC PANEL
Anion gap: 16 — ABNORMAL HIGH (ref 5–15)
BUN: 29 mg/dL — ABNORMAL HIGH (ref 8–23)
CO2: 25 mmol/L (ref 22–32)
Calcium: 8.7 mg/dL — ABNORMAL LOW (ref 8.9–10.3)
Chloride: 101 mmol/L (ref 98–111)
Creatinine, Ser: 2.6 mg/dL — ABNORMAL HIGH (ref 0.61–1.24)
GFR calc Af Amer: 27 mL/min — ABNORMAL LOW (ref >60)
GFR calc non Af Amer: 23 mL/min — ABNORMAL LOW (ref >60)
Glucose, Bld: 127 mg/dL — ABNORMAL HIGH (ref 70–99)
Potassium: 3.4 mmol/L — ABNORMAL LOW (ref 3.5–5.1)
Sodium: 142 mmol/L (ref 135–145)

## 2018-08-26 LAB — BLOOD GAS, ARTERIAL
Acid-Base Excess: 4.4 mmol/L — ABNORMAL HIGH (ref 0.0–2.0)
Bicarbonate: 26.7 mmol/L (ref 20.0–28.0)
FIO2: 40
MECHVT: 500 mL
Mechanical Rate: 18
O2 Saturation: 98.7 %
PEEP: 5 cmH2O
Patient temperature: 37
pCO2 arterial: 32 mmHg (ref 32.0–48.0)
pH, Arterial: 7.53 — ABNORMAL HIGH (ref 7.350–7.450)
pO2, Arterial: 108 mmHg (ref 83.0–108.0)

## 2018-08-26 LAB — PROCALCITONIN: Procalcitonin: 13.33 ng/mL

## 2018-08-26 LAB — ECHOCARDIOGRAM COMPLETE
Height: 67 in
Weight: 3146.41 oz

## 2018-08-26 LAB — BRAIN NATRIURETIC PEPTIDE: B Natriuretic Peptide: 2063 pg/mL — ABNORMAL HIGH (ref 0.0–100.0)

## 2018-08-26 LAB — MRSA PCR SCREENING: MRSA by PCR: NEGATIVE

## 2018-08-26 LAB — MAGNESIUM: Magnesium: 1.8 mg/dL (ref 1.7–2.4)

## 2018-08-26 LAB — POTASSIUM: Potassium: 3.3 mmol/L — ABNORMAL LOW (ref 3.5–5.1)

## 2018-08-26 MED ORDER — POTASSIUM CHLORIDE 20 MEQ/15ML (10%) PO SOLN
40.0000 meq | Freq: Once | ORAL | Status: AC
  Administered 2018-08-26: 40 meq
  Filled 2018-08-26: qty 30

## 2018-08-26 MED ORDER — ALPRAZOLAM 0.5 MG PO TABS
0.5000 mg | ORAL_TABLET | Freq: Every evening | ORAL | Status: DC | PRN
  Administered 2018-08-26 – 2018-09-02 (×7): 0.5 mg via ORAL
  Filled 2018-08-26 (×7): qty 1

## 2018-08-26 MED ORDER — MIDAZOLAM HCL 2 MG/2ML IJ SOLN
INTRAMUSCULAR | Status: AC
  Administered 2018-08-26: 01:00:00 2 mg via INTRAVENOUS
  Filled 2018-08-26: qty 2

## 2018-08-26 MED ORDER — GUAIFENESIN 100 MG/5ML PO SOLN
5.0000 mL | ORAL | Status: DC | PRN
  Administered 2018-08-26: 13:00:00 100 mg via ORAL
  Filled 2018-08-26 (×3): qty 5

## 2018-08-26 MED ORDER — MIDAZOLAM HCL 2 MG/2ML IJ SOLN
2.0000 mg | Freq: Once | INTRAMUSCULAR | Status: AC
  Administered 2018-08-26: 2 mg via INTRAVENOUS

## 2018-08-26 MED ORDER — SODIUM CHLORIDE 0.9 % IV SOLN
1.0000 g | INTRAVENOUS | Status: DC
  Administered 2018-08-26: 04:00:00 1 g via INTRAVENOUS
  Filled 2018-08-26: qty 1

## 2018-08-26 MED ORDER — FUROSEMIDE 10 MG/ML IJ SOLN
80.0000 mg | Freq: Once | INTRAMUSCULAR | Status: AC
  Administered 2018-08-26: 09:00:00 80 mg via INTRAVENOUS
  Filled 2018-08-26: qty 8

## 2018-08-26 MED ORDER — ORAL CARE MOUTH RINSE
15.0000 mL | Freq: Two times a day (BID) | OROMUCOSAL | Status: DC
  Administered 2018-08-26 – 2018-09-02 (×8): 15 mL via OROMUCOSAL

## 2018-08-26 MED ORDER — PIPERACILLIN-TAZOBACTAM 3.375 G IVPB
3.3750 g | Freq: Three times a day (TID) | INTRAVENOUS | Status: DC
  Administered 2018-08-26 – 2018-08-27 (×4): 3.375 g via INTRAVENOUS
  Filled 2018-08-26 (×4): qty 50

## 2018-08-26 MED ORDER — POTASSIUM CHLORIDE 20 MEQ PO PACK
40.0000 meq | PACK | Freq: Once | ORAL | Status: AC
  Administered 2018-08-26: 04:00:00 40 meq
  Filled 2018-08-26: qty 2

## 2018-08-26 MED ORDER — MAGNESIUM SULFATE 2 GM/50ML IV SOLN
2.0000 g | Freq: Once | INTRAVENOUS | Status: AC
  Administered 2018-08-26: 05:00:00 2 g via INTRAVENOUS
  Filled 2018-08-26: qty 50

## 2018-08-26 NOTE — Consult Note (Signed)
Pharmacy Antibiotic Note  Gary Harmon is a 78 y.o. male admitted on 08/25/2018 with cardiac arrest Pharmacy has been consulted for Zosyn dosing for aspiration PNA.  Plan: Start Zosyn 3.375 IV EI every 8 hours  Height: 5\' 7"  (170.2 cm) Weight: 196 lb 10.4 oz (89.2 kg) IBW/kg (Calculated) : 66.1  Temp (24hrs), Avg:97.7 F (36.5 C), Min:96.4 F (35.8 C), Max:99.5 F (37.5 C)  Recent Labs  Lab 08/25/18 0913  WBC 14.8*  CREATININE 1.70*    Estimated Creatinine Clearance: 40 mL/min (A) (by C-G formula based on SCr of 1.7 mg/dL (H)).    No Known Allergies  Antimicrobials this admission: 5/13 ceftriaxone  >> x1 dose 5/13 Zosyn >>   Microbiology results: 5/13 BCx: pending 5/13 UCx: pending 5/12 MRSA PCR: pending 5/12 SARS Coronavirus 2: negative  Thank you for allowing pharmacy to be a part of this patient's care.  Pernell Dupre, PharmD, BCPS Clinical Pharmacist 08/26/2018 5:00 AM

## 2018-08-26 NOTE — Progress Notes (Signed)
Passed SBT this morning and extubated under my direction. Electrolytes have been corrected Tolerating extubation now Nephrology following for AKI on CKD Pulmonary edema on this morning's chest x-ray.  Furosemide given Echocardiogram reveals LVEF 30-35% BNP > 2000 TpI 0.82  Merton Border, MD PCCM service Mobile [phone] Pager [phone] 08/26/2018 4:09 PM

## 2018-08-26 NOTE — Progress Notes (Signed)
   08/26/18 1000  Clinical Encounter Type  Visited With Patient  Visit Type Follow-up  Ch followed up with the pt. Pt extubated and sitting up and talking. Ch will give follow up calls to family members.

## 2018-08-26 NOTE — Consult Note (Signed)
PHARMACY CONSULT NOTE - FOLLOW UP  Pharmacy Consult for Electrolyte Monitoring and Replacement   Recent Labs: Potassium (mmol/L)  Date Value  08/26/2018 3.3 (L)   Magnesium (mg/dL)  Date Value  08/26/2018 1.8   Calcium (mg/dL)  Date Value  08/25/2018 10.6 (H)   Albumin (g/dL)  Date Value  08/25/2018 3.2 (L)   Sodium (mmol/L)  Date Value  08/25/2018 140    Assessment: Pharmacy consulted for electrolyte monitoring and replacement for 78 yo male admitted 5/12 following cardiac arrest. Patient has severe electrolyte abnormalities on admission and is currently intubated.    Goal of Therapy:  Electrolytes WNL K: ~4 mmol/L Mg: ~ 2 mg/dL  Plan:  5/13 K: 3.3, Mg: 1.8- Will replace with KCL 67mEq x1 per tube and Magnesium 2g IV x 1 dose.   Will F/U with AM labs and continue to replace electrolytes as needed.   Pernell Dupre, PharmD, BCPS Clinical Pharmacist 08/26/2018 3:50 AM

## 2018-08-26 NOTE — Consult Note (Signed)
Pharmacy Antibiotic Note  Gary Harmon is a 78 y.o. male admitted on 08/25/2018 with cardiac arrest Pharmacy has been consulted for Zosyn dosing for aspiration PNA.  Plan: Continue Zosyn 3.375 IV EI every 8 hours  Height: 5\' 7"  (170.2 cm) Weight: 196 lb 10.4 oz (89.2 kg) IBW/kg (Calculated) : 66.1  Temp (24hrs), Avg:99 F (37.2 C), Min:97.2 F (36.2 C), Max:99.9 F (37.7 C)  Recent Labs  Lab 08/25/18 0913 08/26/18 0431  WBC 14.8* 12.5*  CREATININE 1.70* 2.60*    Estimated Creatinine Clearance: 26.1 mL/min (A) (by C-G formula based on SCr of 2.6 mg/dL (H)).    No Known Allergies  Antimicrobials this admission: 5/13 ceftriaxone  >> x1 dose 5/13 Zosyn >>   Microbiology results: 5/13 BCx: pending 5/13 UCx: pending 5/12 MRSA PCR: pending 5/12 SARS Coronavirus 2: negative  Thank you for allowing pharmacy to be a part of this patient's care.  Paulina Fusi, PharmD, BCPS 08/26/2018 2:50 PM

## 2018-08-26 NOTE — Progress Notes (Signed)
Assisted tele visit to patient with wife and multiple family members.  Maryelizabeth Rowan, RN

## 2018-08-26 NOTE — Progress Notes (Signed)
[place], [place] 08/26/18  Subjective:   78 year old Caucasian male with past medical history hypertension, obesity, lower extremity edema, chronic systolic heart failure with ejection fraction of 40%, obstructive sleep apnea, gout, and depression  K improved to 3.4 today However, renal function is a bit worse Patient is oliguric Alert and able to follow commands   Objective:  Vital signs in last 24 hours:  Temp:  [96.4 F (35.8 C)-99.9 F (37.7 C)] 99.7 F (37.6 C) (05/13 0700) Pulse Rate:  [61-107] 65 (05/13 0700) Resp:  [14-26] 14 (05/13 0700) BP: (55-143)/(40-115) 100/72 (05/13 0700) SpO2:  [91 %-100 %] 100 % (05/13 0645) FiO2 (%):  [35 %-100 %] 35 % (05/13 0750) Weight:  [89.2 kg] 89.2 kg (05/12 1234)  Weight change:  Filed Weights   08/25/18 0910 08/25/18 1234  Weight: 86.2 kg 89.2 kg    Intake/Output:    Intake/Output Summary (Last 24 hours) at 08/26/2018 0910 Last data filed at 08/26/2018 0645 Gross per 24 hour  Intake 3865.12 ml  Output 420 ml  Net 3445.12 ml     Physical Exam: General: Critically ill appearing  HEENT Anicteric, ETT in place  Neck supple  Pulm/lungs Vent assisted, Fio2 35%, clear to auscultation  CVS/Heart Regular, tachycardic  Abdomen:  Soft. NT  Extremities: + pitting edema  Neurologic: sedated  Skin: No acute rashes  Access:    Foley, rectal tube in place    Basic Metabolic Panel:  Recent Labs  Lab 08/25/18 0913 08/25/18 1040 08/25/18 1300 08/25/18 1619 08/26/18 0143 08/26/18 0431  NA 140  --   --   --   --  142  K <2.0* <2.0* 2.4* 3.8 3.3* 3.4*  CL 91*  --   --   --   --  101  CO2 21*  --   --   --   --  25  GLUCOSE 310*  --   --   --   --  127*  BUN 21  --   --   --   --  29*  CREATININE 1.70*  --   --   --   --  2.60*  CALCIUM 10.6*  --   --   --   --  8.7*  MG  --  0.8*  --   --  1.8  --      CBC: Recent Labs  Lab 08/25/18 0913 08/26/18 0431  WBC 14.8* 12.5*  NEUTROABS  8.7* 10.6*  HGB 10.8* 10.7*  HCT 34.8* 33.5*  MCV 99.1 95.4  PLT 312 319     No results found for: HEPBSAG, HEPBSAB, HEPBIGM    Microbiology:  Recent Results (from the past 240 hour(s))  SARS Coronavirus 2 (CEPHEID - Performed in [facility] hospital lab), Hosp Order     Status: None   Collection Time: 08/25/18  9:13 AM  Result Value Ref Range Status   SARS Coronavirus 2 NEGATIVE NEGATIVE Final    Comment: (NOTE) If result is NEGATIVE SARS-CoV-2 target nucleic acids are NOT DETECTED. The SARS-CoV-2 RNA is generally detectable in upper and lower  respiratory specimens during the acute phase of infection. The lowest  concentration of SARS-CoV-2 viral copies this assay can detect is 250  copies / mL. A negative result does not preclude SARS-CoV-2 infection  and should not be used as the sole basis for treatment or other  patient management decisions.  A negative result may occur with  improper specimen collection / handling,  submission of specimen other  than nasopharyngeal swab, presence of viral mutation(s) within the  areas targeted by this assay, and inadequate number of viral copies  (<250 copies / mL). A negative result must be combined with clinical  observations, patient history, and epidemiological information. If result is POSITIVE SARS-CoV-2 target nucleic acids are DETECTED. The SARS-CoV-2 RNA is generally detectable in upper and lower  respiratory specimens dur ing the acute phase of infection.  Positive  results are indicative of active infection with SARS-CoV-2.  Clinical  correlation with patient history and other diagnostic information is  necessary to determine patient infection status.  Positive results do  not rule out bacterial infection or co-infection with other viruses. If result is PRESUMPTIVE POSTIVE SARS-CoV-2 nucleic acids MAY BE PRESENT.   A presumptive positive result was obtained on the submitted specimen  and confirmed on repeat testing.  While  2019 novel coronavirus  (SARS-CoV-2) nucleic acids may be present in the submitted sample  additional confirmatory testing may be necessary for epidemiological  and / or clinical management purposes  to differentiate between  SARS-CoV-2 and other Sarbecovirus currently known to infect humans.  If clinically indicated additional testing with an alternate test  methodology [phone] is advised. The SARS-CoV-2 RNA is generally  detectable in upper and lower respiratory sp ecimens during the acute  phase of infection. The expected result is Negative. Fact Sheet for Patients:  StrictlyIdeas.no Fact Sheet for Healthcare Providers: BankingDealers.co.za This test is not yet approved or cleared by the [place] FDA and has been authorized for detection and/or diagnosis of SARS-CoV-2 by FDA under an Emergency Use Authorization (EUA).  This EUA will remain in effect (meaning this test can be used) for the duration of the COVID-19 declaration under Section 564(b)(1) of the Act, 21 U.S.C. section 360bbb-3(b)(1), unless the authorization is terminated or revoked sooner. Performed at [facility], [address] [place], [place] [address]   MRSA PCR Screening     Status: Abnormal   Collection Time: 08/25/18 12:37 PM  Result Value Ref Range Status   MRSA by PCR (A) NEGATIVE Final    INVALID, UNABLE TO DETERMINE THE PRESENCE OF TARGET DNA DUE TO SPECIMEN INTEGRITY. RECOLLECTION REQUESTED.    Comment: RESULT CALLED TO, READ BACK BY AND VERIFIED WITH: CALLED TO TAYLOR '@[id number]'$  08/25/2018 [facility] Performed at [facility], [address] [place], [place] [address]   C difficile quick scan w PCR reflex     Status: None   Collection Time: 08/25/18  2:28 PM  Result Value Ref Range Status   C Diff antigen NEGATIVE NEGATIVE Final   C Diff toxin NEGATIVE NEGATIVE Final   C Diff interpretation No C. difficile detected.  Final    Comment:  Performed at [facility], [address] [place], [place] [address]    Coagulation Studies: Recent Labs    08/25/18 0913  LABPROT 16.7*  INR 1.4*    Urinalysis: Recent Labs    08/25/18 0913 08/26/18 0251  COLORURINE YELLOW* YELLOW*  LABSPEC 1.010 1.013  PHURINE 7.0 5.0  GLUCOSEU NEGATIVE NEGATIVE  HGBUR SMALL* MODERATE*  BILIRUBINUR NEGATIVE NEGATIVE  KETONESUR NEGATIVE NEGATIVE  PROTEINUR 30* 100*  NITRITE NEGATIVE NEGATIVE  LEUKOCYTESUR NEGATIVE MODERATE*      Imaging: Ct Head Wo Contrast  Result Date: 08/25/2018 CLINICAL DATA:  Cardiac arrest with resuscitation. EXAM: CT HEAD WITHOUT CONTRAST CT CERVICAL SPINE WITHOUT CONTRAST TECHNIQUE: Multidetector CT imaging of the head and cervical spine was performed following  the standard protocol without intravenous contrast. Multiplanar CT image reconstructions of the cervical spine were also generated. COMPARISON:  CT head 07/28/2018 FINDINGS: CT HEAD FINDINGS Brain: Mild atrophy without hydrocephalus. Moderate chronic white matter changes. No acute infarct, hemorrhage, or mass. Vascular: Atherosclerotic calcification. Negative for hyperdense vessel Skull: Negative Sinuses/Orbits: Negative Other: None CT CERVICAL SPINE FINDINGS Alignment: Normal Skull base and vertebrae: Negative for fracture Soft tissues and spinal canal: 10 mm nodule left upper pole of the thyroid. Atherosclerotic calcification carotid bifurcation bilaterally. No soft tissue mass or adenopathy Disc levels: Disc degeneration and spurring most prominent at C5-6 and C6-7 causing spinal and foraminal stenosis bilaterally. Upper chest: Endotracheal tube and NG tube noted. Moderately large layering pleural effusions bilaterally with bibasilar atelectasis. Other: None IMPRESSION: 1. No acute intracranial abnormality. Atrophy and moderate chronic microvascular ischemic change 2. Negative for cervical spine fracture.  Cervical spondylosis 3. Moderately large  bilateral pleural effusions. Electronically Signed   By: Franchot Gallo M.D.   On: 08/25/2018 10:11   Ct Cervical Spine Wo Contrast  Result Date: 08/25/2018 CLINICAL DATA:  Cardiac arrest with resuscitation. EXAM: CT HEAD WITHOUT CONTRAST CT CERVICAL SPINE WITHOUT CONTRAST TECHNIQUE: Multidetector CT imaging of the head and cervical spine was performed following the standard protocol without intravenous contrast. Multiplanar CT image reconstructions of the cervical spine were also generated. COMPARISON:  CT head 07/28/2018 FINDINGS: CT HEAD FINDINGS Brain: Mild atrophy without hydrocephalus. Moderate chronic white matter changes. No acute infarct, hemorrhage, or mass. Vascular: Atherosclerotic calcification. Negative for hyperdense vessel Skull: Negative Sinuses/Orbits: Negative Other: None CT CERVICAL SPINE FINDINGS Alignment: Normal Skull base and vertebrae: Negative for fracture Soft tissues and spinal canal: 10 mm nodule left upper pole of the thyroid. Atherosclerotic calcification carotid bifurcation bilaterally. No soft tissue mass or adenopathy Disc levels: Disc degeneration and spurring most prominent at C5-6 and C6-7 causing spinal and foraminal stenosis bilaterally. Upper chest: Endotracheal tube and NG tube noted. Moderately large layering pleural effusions bilaterally with bibasilar atelectasis. Other: None IMPRESSION: 1. No acute intracranial abnormality. Atrophy and moderate chronic microvascular ischemic change 2. Negative for cervical spine fracture.  Cervical spondylosis 3. Moderately large bilateral pleural effusions. Electronically Signed   By: Franchot Gallo M.D.   On: 08/25/2018 10:11   Dg Chest Port 1 View  Result Date: 08/26/2018 CLINICAL DATA:  78 year old male respiratory failure. Negative for COVID-19. Yesterday. EXAM: PORTABLE CHEST 1 VIEW COMPARISON:  08/25/2018, 07/10/2015. FINDINGS: Portable AP semi upright view at 0452 hours. Endotracheal tube tip in good position between the  level the clavicles and carina. Enteric tube courses to the abdomen, tip not included. Pacer or resuscitation pads again project about the chest. Increased interstitial and veiling opacity in both lungs since yesterday, greater on the right. Stable cardiac size and mediastinal contours. No pneumothorax. No air bronchograms, although increased retrocardiac opacity obscuring the diaphragm. Stable visualized osseous structures. IMPRESSION: 1.  Stable lines and tubes. 2. Worsening interstitial in veiling opacity in both lungs. Favor acute pulmonary edema with pleural effusions, and lower lobe collapse or consolidation. Electronically Signed   By: Genevie Ann M.D.   On: 08/26/2018 06:41   Dg Chest Portable 1 View  Result Date: 08/25/2018 CLINICAL DATA:  Cardiac arrest. EXAM: PORTABLE CHEST 1 VIEW COMPARISON:  07/10/2015 FINDINGS: Endotracheal tube tip is 6 cm above the carina. Orogastric or nasogastric tube enters the stomach. Heart is enlarged. There is widespread pulmonary density most consistent with edema. Aspiration not excluded. No visible effusion. IMPRESSION: Endotracheal tube and  orogastric or nasogastric tube well positioned. Widespread pulmonary density probably representing edema. Some coexistent aspiration not excluded. Electronically Signed   By: Nelson Chimes M.D.   On: 08/25/2018 10:00   Dg Abd Portable 1 View  Result Date: 08/25/2018 CLINICAL DATA:  Cardiac arrest. EXAM: PORTABLE ABDOMEN - 1 VIEW COMPARISON:  None. FINDINGS: Orogastric tube has its tip in the mid body of the stomach. Gas pattern appears unremarkable. No abnormal calcifications. Ordinary degenerative changes affect the spine IMPRESSION: Orogastric tube tip in the mid body of the stomach Electronically Signed   By: Nelson Chimes M.D.   On: 08/25/2018 10:01     Medications:   . phenylephrine (NEO-SYNEPHRINE) Adult infusion 30 mcg/min (08/26/18 0645)  . piperacillin-tazobactam (ZOSYN)  IV 12.5 mL/hr at 08/26/18 0645   .  chlorhexidine gluconate (MEDLINE KIT)  15 mL Mouth Rinse BID  . furosemide  80 mg Intravenous Once  . heparin  5,000 Units Subcutaneous Q8H  . mouth rinse  15 mL Mouth Rinse 10 times per day  . potassium chloride  40 mEq Per Tube Once     Assessment/ Plan:  78 y.o. Caucasian male  with past medical history hypertension, obesity, lower extremity edema, chronic systolic heart failure with ejection fraction of 40%, obstructive sleep apnea, gout, and depression  1. Severe Hypokalemia 2. AKI on CKD st 3. Baseline Cr 1.50/GFR 45 on 08/17/2018 3. Hypomagnesemia 4. Acute resp failure, post outpatient cardiac arrest 5. LE Edema  Plan: Oliguric due to ATN Monitor UOP Agree with further potassium replacement With ATN, will need less dose now Plan to extubate shortly after KCl replacement    LOS: [place] 5/13/20209:10 AM  [place], [place]  Note: This note was prepared with Dragon dictation. Any transcription errors are unintentional

## 2018-08-26 NOTE — Plan of Care (Signed)
Patient is extubated to 2 lpm Oxygen. Tolerating well

## 2018-08-26 NOTE — Progress Notes (Addendum)
[place] at [place] NAME: Gary Harmon    MR#:  [id number]  DATE OF BIRTH:  Sep 08, 1940  SUBJECTIVE:   Patient extubated. Has some coughing. REVIEW OF SYSTEMS:   Review of Systems  Constitutional: Negative for chills, fever and weight loss.  HENT: Negative for ear discharge, ear pain and nosebleeds.   Eyes: Negative for blurred vision, pain and discharge.  Respiratory: Positive for cough and shortness of breath. Negative for sputum production, wheezing and stridor.   Cardiovascular: Positive for orthopnea. Negative for chest pain, palpitations and PND.  Gastrointestinal: Negative for abdominal pain, diarrhea, nausea and vomiting.  Genitourinary: Negative for frequency and urgency.  Musculoskeletal: Negative for back pain and joint pain.  Neurological: Negative for sensory change, speech change, focal weakness and weakness.  Psychiatric/Behavioral: Negative for depression and hallucinations. The patient is not nervous/anxious.    Tolerating Diet:yes Tolerating PT: pending  DRUG ALLERGIES:  No Known Allergies  VITALS:  Blood pressure 97/78, pulse 63, temperature 98.6 F (37 C), resp. rate 13, height 5\' 7"  (1.702 m), weight 89.2 kg, SpO2 100 %.  PHYSICAL EXAMINATION:   Physical Exam  GENERAL:  78 y.o.-year-old patient lying in the bed with no acute distress.  EYES: Pupils equal, round, reactive to light and accommodation. No scleral icterus. Extraocular muscles intact.  HEENT: Head atraumatic, normocephalic. Oropharynx and nasopharynx clear.  NECK:  Supple, no jugular venous distention. No thyroid enlargement, no tenderness.  LUNGS:decreased breath sounds bilaterally, no wheezing, rales, rhonchi. No use of accessory muscles of respiration.  CARDIOVASCULAR: S1, S2 normal. No murmurs, rubs, or gallops.  ABDOMEN: Soft, nontender, nondistended. Bowel sounds present. No organomegaly or mass.  EXTREMITIES: No cyanosis, clubbing or  edema b/l.    NEUROLOGIC: Cranial nerves II through XII are intact. No focal Motor or sensory deficits b/l.   PSYCHIATRIC:  patient is alert and oriented x 3.  SKIN: No obvious rash, lesion, or ulcer.   LABORATORY PANEL:  CBC Recent Labs  Lab 08/26/18 0431  WBC 12.5*  HGB 10.7*  HCT 33.5*  PLT 319    Chemistries  Recent Labs  Lab 08/25/18 0913  08/26/18 0143 08/26/18 0431  NA 140  --   --  142  K <2.0*   < > 3.3* 3.4*  CL 91*  --   --  101  CO2 21*  --   --  25  GLUCOSE 310*  --   --  127*  BUN 21  --   --  29*  CREATININE 1.70*  --   --  2.60*  CALCIUM 10.6*  --   --  8.7*  MG  --    < > 1.8  --   AST 52*  --   --   --   ALT 20  --   --   --   ALKPHOS 88  --   --   --   BILITOT 0.8  --   --   --    < > = values in this interval not displayed.   Cardiac Enzymes Recent Labs  Lab 08/25/18 0913  TROPONINI 0.82*   RADIOLOGY:  Ct Head Wo Contrast  Result Date: 08/25/2018 CLINICAL DATA:  Cardiac arrest with resuscitation. EXAM: CT HEAD WITHOUT CONTRAST CT CERVICAL SPINE WITHOUT CONTRAST TECHNIQUE: Multidetector CT imaging of the head and cervical spine was performed following the standard protocol without intravenous contrast. Multiplanar CT image reconstructions of the cervical spine were also generated.  COMPARISON:  CT head 07/28/2018 FINDINGS: CT HEAD FINDINGS Brain: Mild atrophy without hydrocephalus. Moderate chronic white matter changes. No acute infarct, hemorrhage, or mass. Vascular: Atherosclerotic calcification. Negative for hyperdense vessel Skull: Negative Sinuses/Orbits: Negative Other: None CT CERVICAL SPINE FINDINGS Alignment: Normal Skull base and vertebrae: Negative for fracture Soft tissues and spinal canal: 10 mm nodule left upper pole of the thyroid. Atherosclerotic calcification carotid bifurcation bilaterally. No soft tissue mass or adenopathy Disc levels: Disc degeneration and spurring most prominent at C5-6 and C6-7 causing spinal and foraminal stenosis  bilaterally. Upper chest: Endotracheal tube and NG tube noted. Moderately large layering pleural effusions bilaterally with bibasilar atelectasis. Other: None IMPRESSION: 1. No acute intracranial abnormality. Atrophy and moderate chronic microvascular ischemic change 2. Negative for cervical spine fracture.  Cervical spondylosis 3. Moderately large bilateral pleural effusions. Electronically Signed   By: Franchot Gallo M.D.   On: 08/25/2018 10:11   Ct Cervical Spine Wo Contrast  Result Date: 08/25/2018 CLINICAL DATA:  Cardiac arrest with resuscitation. EXAM: CT HEAD WITHOUT CONTRAST CT CERVICAL SPINE WITHOUT CONTRAST TECHNIQUE: Multidetector CT imaging of the head and cervical spine was performed following the standard protocol without intravenous contrast. Multiplanar CT image reconstructions of the cervical spine were also generated. COMPARISON:  CT head 07/28/2018 FINDINGS: CT HEAD FINDINGS Brain: Mild atrophy without hydrocephalus. Moderate chronic white matter changes. No acute infarct, hemorrhage, or mass. Vascular: Atherosclerotic calcification. Negative for hyperdense vessel Skull: Negative Sinuses/Orbits: Negative Other: None CT CERVICAL SPINE FINDINGS Alignment: Normal Skull base and vertebrae: Negative for fracture Soft tissues and spinal canal: 10 mm nodule left upper pole of the thyroid. Atherosclerotic calcification carotid bifurcation bilaterally. No soft tissue mass or adenopathy Disc levels: Disc degeneration and spurring most prominent at C5-6 and C6-7 causing spinal and foraminal stenosis bilaterally. Upper chest: Endotracheal tube and NG tube noted. Moderately large layering pleural effusions bilaterally with bibasilar atelectasis. Other: None IMPRESSION: 1. No acute intracranial abnormality. Atrophy and moderate chronic microvascular ischemic change 2. Negative for cervical spine fracture.  Cervical spondylosis 3. Moderately large bilateral pleural effusions. Electronically Signed   By:  Franchot Gallo M.D.   On: 08/25/2018 10:11   Dg Chest Port 1 View  Result Date: 08/26/2018 CLINICAL DATA:  78 year old male respiratory failure. Negative for COVID-19. Yesterday. EXAM: PORTABLE CHEST 1 VIEW COMPARISON:  08/25/2018, 07/10/2015. FINDINGS: Portable AP semi upright view at 0452 hours. Endotracheal tube tip in good position between the level the clavicles and carina. Enteric tube courses to the abdomen, tip not included. Pacer or resuscitation pads again project about the chest. Increased interstitial and veiling opacity in both lungs since yesterday, greater on the right. Stable cardiac size and mediastinal contours. No pneumothorax. No air bronchograms, although increased retrocardiac opacity obscuring the diaphragm. Stable visualized osseous structures. IMPRESSION: 1.  Stable lines and tubes. 2. Worsening interstitial in veiling opacity in both lungs. Favor acute pulmonary edema with pleural effusions, and lower lobe collapse or consolidation. Electronically Signed   By: Genevie Ann M.D.   On: 08/26/2018 06:41   Dg Chest Portable 1 View  Result Date: 08/25/2018 CLINICAL DATA:  Cardiac arrest. EXAM: PORTABLE CHEST 1 VIEW COMPARISON:  07/10/2015 FINDINGS: Endotracheal tube tip is 6 cm above the carina. Orogastric or nasogastric tube enters the stomach. Heart is enlarged. There is widespread pulmonary density most consistent with edema. Aspiration not excluded. No visible effusion. IMPRESSION: Endotracheal tube and orogastric or nasogastric tube well positioned. Widespread pulmonary density probably representing edema. Some coexistent aspiration not excluded.  Electronically Signed   By: Nelson Chimes M.D.   On: 08/25/2018 10:00   Dg Abd Portable 1 View  Result Date: 08/25/2018 CLINICAL DATA:  Cardiac arrest. EXAM: PORTABLE ABDOMEN - 1 VIEW COMPARISON:  None. FINDINGS: Orogastric tube has its tip in the mid body of the stomach. Gas pattern appears unremarkable. No abnormal calcifications. Ordinary  degenerative changes affect the spine IMPRESSION: Orogastric tube tip in the mid body of the stomach Electronically Signed   By: Nelson Chimes M.D.   On: 08/25/2018 10:01   ASSESSMENT AND PLAN:  Gary Harmon  is a 78 y.o. male with a known history of congestive heart failure systolic, chronic kidney disease stage III, hypertension, gout comes to the emergency room after he had a cardiac arrest at [employer] while shopping. According to the wife and ER records patient was having a routine day and while at checkout per wife he all of a sudden collapsed.   1. Cardiorespiratory arrest. Patient now off ventilator -suspected due to severe electrolyte abnormality  2. Severe electrolyte abnormality with hypokalemia and severe hypomagnesimia -patient currently getting IV potassium and IV magnesium--repelted -patient recently had hyperkalemia came to the emergency room was taken of his potassium and lokelma was started  3. Chronic kidney disease stage IV follows with Dr. Zollie Scale -baseline creatinine around 1.5 -spoke with Dr Candiss Norse  4. Elevated troponin in the setting of cardiac arrest -no history of CAD per family -Dr. Bethanne Ginger input appreciated  5. Mild leukocytosis appears reactive  6. DVT prophylaxis subcu heparin  7/patient has history of cardiomyopathy with EF of 30 to 35% -x-ray questionable pulmonary edema versus aspiration -empirically on Zosyn -lasix 80 mg x 1 today   Discussed with patient's daughter CODE STATUS: full  DVT Prophylaxis: heparin TOTAL TIME TAKING CARE OF THIS PATIENT: *30* minutes.  >50% time spent on counselling and coordination of care  POSSIBLE D/C IN *1-2* DAYS, DEPENDING ON CLINICAL CONDITION.  Note: This dictation was prepared with Dragon dictation along with smaller phrase technology. Any transcriptional errors that result from this process are unintentional.  Fritzi Mandes M.D on 08/26/2018 at 2:36 PM  Between 7am to 6pm - Pager - [phone]  After 6pm go  to [email or web address] - password [email or web address] [facility]  Office  [phone]  CC: Primary care physician; Valera Castle, MDPatient ID: Gary Harmon, male   DOB: 09-22-1940, 78 y.o.   MRN: [id number]

## 2018-08-26 NOTE — Progress Notes (Signed)
*  PRELIMINARY RESULTS* Echocardiogram 2D Echocardiogram has been performed.  [place] 08/26/2018, 10:26 AM

## 2018-08-26 NOTE — Progress Notes (Signed)
Patient Name: Gary Harmon Date of Encounter: 08/26/2018  Hospital Problem List     Active Problems:   Cardiorespiratory arrest [facility])    Patient Profile     Patient is doing much better.  He is extubated sitting up talking.  Potassium is 3.4.  BNP is 2063.  White blood cell count is 12.5.  Creatinine is 2.6.  Subjective   Feels a lot better.  Alert and oriented.  Inpatient Medications    . chlorhexidine gluconate (MEDLINE KIT)  15 mL Mouth Rinse BID  . heparin  5,000 Units Subcutaneous Q8H  . mouth rinse  15 mL Mouth Rinse 10 times per day    Vital Signs    Vitals:   08/26/18 0630 08/26/18 0645 08/26/18 0700 08/26/18 0954  BP: 91/65 96/72 100/72 (!) 79/59  Pulse: 62 64 65   Resp: _0 Temp: 99.7 F (37.6 C) 99.7 F (37.6 C) 99.7 F (37.6 C)   TempSrc:      SpO2: 97% 100%    Weight:      Height:        Intake/Output Summary (Last 24 hours) at 08/26/2018 1110 Last data filed at 08/26/2018 0645 Gross per 24 hour  Intake 3865.12 ml  Output 420 ml  Net 3445.12 ml   Filed Weights   08/25/18 0910 08/25/18 1234  Weight: 86.2 kg 89.2 kg    Physical Exam    GEN: Well nourished, well developed, in no acute distress.  HEENT: normal.  Neck: Supple, no JVD, carotid bruits, or masses. Cardiac: RRR, no murmurs, rubs, or gallops. No clubbing, cyanosis, edema.  Radials/DP/PT 2+ and equal bilaterally.  Respiratory:  Respirations regular and unlabored, clear to auscultation bilaterally. GI: Soft, nontender, nondistended, BS + x 4. MS: no deformity or atrophy. Skin: warm and dry, no rash. Neuro:  Strength and sensation are intact. Psych: Normal affect.  Labs    CBC Recent Labs    08/25/18 0913 08/26/18 0431  WBC 14.8* 12.5*  NEUTROABS 8.7* 10.6*  HGB 10.8* 10.7*  HCT 34.8* 33.5*  MCV 99.1 95.4  PLT 312 983   Basic Metabolic Panel Recent Labs    08/25/18 0913 08/25/18 1040  08/26/18 0143 08/26/18 0431  NA 140  --   --   --  142  K <2.0* <2.0*    < > 3.3* 3.4*  CL 91*  --   --   --  101  CO2 21*  --   --   --  25  GLUCOSE 310*  --   --   --  127*  BUN 21  --   --   --  29*  CREATININE 1.70*  --   --   --  2.60*  CALCIUM 10.6*  --   --   --  8.7*  MG  --  0.8*  --  1.8  --    < > = values in this interval not displayed.   Liver Function Tests Recent Labs    08/25/18 0913  AST 52*  ALT 20  ALKPHOS 88  BILITOT 0.8  PROT 6.5  ALBUMIN 3.2*   No results for input(s): LIPASE, AMYLASE in the last 72 hours. Cardiac Enzymes Recent Labs    08/25/18 0913  TROPONINI 0.82*   BNP Recent Labs    08/26/18 0431  BNP 2,063.0*   D-Dimer No results for input(s): DDIMER in the last 72 hours. Hemoglobin A1C No results for input(s): HGBA1C in the  last 72 hours. Fasting Lipid Panel No results for input(s): CHOL, HDL, LDLCALC, TRIG, CHOLHDL, LDLDIRECT in the last 72 hours. Thyroid Function Tests No results for input(s): TSH, T4TOTAL, T3FREE, THYROIDAB in the last 72 hours.  Invalid input(s): FREET3  Telemetry    Sinus rhythm  ECG  Sinus rhythm with no ischemia.  Radiology    Ct Head Wo Contrast  Result Date: 08/25/2018 CLINICAL DATA:  Cardiac arrest with resuscitation. EXAM: CT HEAD WITHOUT CONTRAST CT CERVICAL SPINE WITHOUT CONTRAST TECHNIQUE: Multidetector CT imaging of the head and cervical spine was performed following the standard protocol without intravenous contrast. Multiplanar CT image reconstructions of the cervical spine were also generated. COMPARISON:  CT head 07/28/2018 FINDINGS: CT HEAD FINDINGS Brain: Mild atrophy without hydrocephalus. Moderate chronic white matter changes. No acute infarct, hemorrhage, or mass. Vascular: Atherosclerotic calcification. Negative for hyperdense vessel Skull: Negative Sinuses/Orbits: Negative Other: None CT CERVICAL SPINE FINDINGS Alignment: Normal Skull base and vertebrae: Negative for fracture Soft tissues and spinal canal: 10 mm nodule left upper pole of the thyroid.  Atherosclerotic calcification carotid bifurcation bilaterally. No soft tissue mass or adenopathy Disc levels: Disc degeneration and spurring most prominent at C5-6 and C6-7 causing spinal and foraminal stenosis bilaterally. Upper chest: Endotracheal tube and NG tube noted. Moderately large layering pleural effusions bilaterally with bibasilar atelectasis. Other: None IMPRESSION: 1. No acute intracranial abnormality. Atrophy and moderate chronic microvascular ischemic change 2. Negative for cervical spine fracture.  Cervical spondylosis 3. Moderately large bilateral pleural effusions. Electronically Signed   By: Franchot Gallo M.D.   On: 08/25/2018 10:11   Ct Head Wo Contrast  Result Date: 07/28/2018 CLINICAL DATA:  Acute onset of dizziness and falling today. EXAM: CT HEAD WITHOUT CONTRAST TECHNIQUE: Contiguous axial images were obtained from the base of the skull through the vertex without intravenous contrast. COMPARISON:  None. FINDINGS: Brain: No sign of acute infarction by CT. There chronic small-vessel ischemic changes of the pons and throughout the cerebral hemispheric white matter. No cortical or large vessel territory infarction. No mass lesion, hemorrhage, hydrocephalus or extra-axial collection. Vascular: There is atherosclerotic calcification of the major vessels at the base of the brain. Skull: Negative Sinuses/Orbits: Clear/normal Other: None IMPRESSION: No acute finding by CT. Chronic small-vessel ischemic changes throughout the brain, often seen at this age. Electronically Signed   By: Nelson Chimes M.D.   On: 07/28/2018 15:38   Ct Cervical Spine Wo Contrast  Result Date: 08/25/2018 CLINICAL DATA:  Cardiac arrest with resuscitation. EXAM: CT HEAD WITHOUT CONTRAST CT CERVICAL SPINE WITHOUT CONTRAST TECHNIQUE: Multidetector CT imaging of the head and cervical spine was performed following the standard protocol without intravenous contrast. Multiplanar CT image reconstructions of the cervical  spine were also generated. COMPARISON:  CT head 07/28/2018 FINDINGS: CT HEAD FINDINGS Brain: Mild atrophy without hydrocephalus. Moderate chronic white matter changes. No acute infarct, hemorrhage, or mass. Vascular: Atherosclerotic calcification. Negative for hyperdense vessel Skull: Negative Sinuses/Orbits: Negative Other: None CT CERVICAL SPINE FINDINGS Alignment: Normal Skull base and vertebrae: Negative for fracture Soft tissues and spinal canal: 10 mm nodule left upper pole of the thyroid. Atherosclerotic calcification carotid bifurcation bilaterally. No soft tissue mass or adenopathy Disc levels: Disc degeneration and spurring most prominent at C5-6 and C6-7 causing spinal and foraminal stenosis bilaterally. Upper chest: Endotracheal tube and NG tube noted. Moderately large layering pleural effusions bilaterally with bibasilar atelectasis. Other: None IMPRESSION: 1. No acute intracranial abnormality. Atrophy and moderate chronic microvascular ischemic change 2. Negative for cervical spine  fracture.  Cervical spondylosis 3. Moderately large bilateral pleural effusions. Electronically Signed   By: Franchot Gallo M.D.   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 FINDINGS: Right Kidney: Renal measurements: 7.7 x 4.0 x 3.4 cm = volume: 54 mL. Negative for hydronephrosis. Cortical thinning with small renal volume. Left Kidney: Renal measurements: 10.6 x 4.8 x 9.4 cm = volume: 117 mL. Echogenicity within normal limits. No mass or hydronephrosis visualized. Bladder: Enlarged prostate projecting into the base of the bladder. Bladder volume normal. No significant bladder wall thickening. IMPRESSION: Negative for hydronephrosis.  Small volume right kidney 54 mL Significant enlargement the prostate projecting into the bladder. Electronically Signed   By: Franchot Gallo M.D.   On: 07/30/2018 14:45   Dg Chest Port 1 View  Result Date: 08/26/2018 CLINICAL DATA:  78 year old male respiratory failure. Negative for COVID-19. Yesterday. EXAM: PORTABLE CHEST 1 VIEW COMPARISON:  08/25/2018, 07/10/2015. FINDINGS: Portable AP semi upright view at 0452 hours. Endotracheal tube tip in good position between the level the clavicles and carina. Enteric tube courses to the abdomen, tip not included. Pacer or resuscitation pads again project about the chest. Increased interstitial and veiling opacity in both lungs since yesterday, greater on the right. Stable cardiac size and mediastinal contours. No pneumothorax. No air bronchograms, although increased retrocardiac opacity obscuring the diaphragm. Stable visualized osseous structures. IMPRESSION: 1.  Stable lines and tubes. 2. Worsening interstitial in veiling opacity in both lungs. Favor acute pulmonary edema with pleural effusions, and lower lobe collapse or consolidation. Electronically Signed   By: Genevie Ann M.D.   On: 08/26/2018 06:41   Dg Chest Portable 1 View  Result Date: 08/25/2018 CLINICAL DATA:  Cardiac arrest. EXAM: PORTABLE CHEST 1 VIEW COMPARISON:  07/10/2015 FINDINGS: Endotracheal tube tip is 6 cm above the carina. Orogastric or nasogastric tube enters the stomach. Heart is enlarged. There is widespread pulmonary density most consistent with edema. Aspiration not excluded. No visible effusion. IMPRESSION: Endotracheal tube and orogastric or nasogastric tube well positioned. Widespread pulmonary density probably representing edema. Some coexistent aspiration not excluded. Electronically Signed   By: Nelson Chimes M.D.   On: 08/25/2018 10:00   Dg Abd Portable 1 View  Result Date: 08/25/2018 CLINICAL DATA:  Cardiac arrest. EXAM: PORTABLE ABDOMEN - 1 VIEW COMPARISON:  None. FINDINGS: Orogastric tube has its tip in the mid body of the stomach. Gas pattern appears unremarkable. No abnormal calcifications. Ordinary  degenerative changes affect the spine IMPRESSION: Orogastric tube tip in the mid body of the stomach Electronically Signed   By: Nelson Chimes M.D.   On: 08/25/2018 10:01   Vas US Renal Artery Duplex  Result Date: 08/18/2018 ABDOMINAL VISCERAL Indications: Chronic kidney disease. Limitations: Air/bowel gas. Performing Technologist: Charlane Ferretti RT (R)(VS)  Examination Guidelines: A complete evaluation includes B-mode imaging, spectral Doppler, color Doppler, and power Doppler as needed of all accessible portions of each vessel. Bilateral testing is considered an integral part of a complete examination. Limited examinations for reoccurring indications may be performed as noted.  Duplex Findings: +------------------+--------+--------+-------+ Right Renal ArteryPSV cm/sEDV cm/sComment +------------------+--------+--------+-------+ Origin              110                   +------------------+--------+--------+-------+ Proximal  58                   +------------------+--------+--------+-------+ Mid                  83      19           +------------------+--------+--------+-------+ Distal               98                   +------------------+--------+--------+-------+ +-----------------+--------+--------+-------+ Left Renal ArteryPSV cm/sEDV cm/sComment +-----------------+--------+--------+-------+ Origin             102                   +-----------------+--------+--------+-------+ Proximal           172      29           +-----------------+--------+--------+-------+ Mid                192                   +-----------------+--------+--------+-------+ Distal              17      4            +-----------------+--------+--------+-------+  +------------------+-----+------------------+-----+ Right Kidney           Left Kidney             +------------------+-----+------------------+-----+ RAR (manual)      1.66 RAR (manual)      3.31   +------------------+-----+------------------+-----+ Kidney length (cm)10.00Kidney length (cm)11.00 +------------------+-----+------------------+-----+  Summary: Renal:  Right: Normal size right kidney. No evidence of right renal artery        stenosis. Left:  Normal size of left kidney. 1-59% stenosis of the left renal        artery.         Bilateral free fluid visualized along the lateral borders.  *See table(s) above for measurements and observations.  Diagnosing physician: Leotis Pain MD  Electronically signed by Leotis Pain MD on 08/18/2018 at 10:38:06 AM.    Final     Assessment & Plan    Cardiac arrest-appears to been secondary to severe hypokalemia.  Appreciate nephrology input as to determine etiology of this.  Patient is currently alert and oriented.  Had mild troponin elevation likely secondary to his cardiac arrest.  At this point does not appear ischemic.  Would continue to replete potassium as you are doing following rhythm.  Will follow for any further dysrhythmia.  Signed, Javier Docker Thomasenia Dowse MD 08/26/2018, 11:10 AM  Pager: [phone]

## 2018-08-26 NOTE — TOC Initial Note (Signed)
Transition of Care [facility]) - Initial/Assessment Note    Patient Details  Name: Gary Harmon MRN: [id number] Date of Birth: 1940/05/25  [facility] CM/SW Contact:    Shelbie Hutching, RN Phone Number: 08/26/2018, 12:04 PM  Clinical Narrative:                 Patient admitted with acute respiratory failure after cardiorespiratory arrest.  Patient is currently in the ICU, extubated this morning and doing well on [place].  Patient reports that at baseline he is independent and requires no assistive devices.  Patient lives with his wife in [place], able to drive.  Patient is current with his PCP and gets prescriptions from [facility] in [place].  No discharge needs identified at this time.   Expected Discharge Plan: Home/Self Care Barriers to Discharge: Continued Medical Work up   Patient Goals and CMS Choice Patient states their goals for this hospitalization and ongoing recovery are:: Get well and go home      Expected Discharge Plan and Services Expected Discharge Plan: Home/Self Care       Living arrangements for the past 2 months: Single Family Home                                      Prior Living Arrangements/Services Living arrangements for the past 2 months: Single Family Home Lives with:: Spouse   Do you feel safe going back to the place where you live?: Yes      Need for Family Participation in Patient Care: Yes (Comment) Care giver support system in place?: Yes (comment)(lives with wife)   Criminal Activity/Legal Involvement Pertinent to Current Situation/Hospitalization: No - Comment as needed  Activities of Daily Living      Permission Sought/Granted                  Emotional Assessment Appearance:: Appears stated age Attitude/Demeanor/Rapport: Engaged Affect (typically observed): Accepting Orientation: : Oriented to Self, Oriented to Place, Oriented to Situation, Oriented to  Time Alcohol / Substance Use: Not Applicable Psych  Involvement: No (comment)  Admission diagnosis:  cardiac arrest ems Patient Active Problem List   Diagnosis Date Noted  . Cardiorespiratory arrest ([place]) 08/25/2018  . Acute CHF ([place]) 07/10/2015   PCP:  Valera Castle, MD Pharmacy:   [place], [place] - [address] [place] [address] Phone: [phone] Fax: [phone]     Social Determinants of Health (SDOH) Interventions    Readmission Risk Interventions No flowsheet data found.

## 2018-08-27 ENCOUNTER — Other Ambulatory Visit: Payer: Self-pay

## 2018-08-27 ENCOUNTER — Inpatient Hospital Stay: Payer: Medicare Other

## 2018-08-27 DIAGNOSIS — E876 Hypokalemia: Principal | ICD-10-CM

## 2018-08-27 DIAGNOSIS — I493 Ventricular premature depolarization: Secondary | ICD-10-CM

## 2018-08-27 DIAGNOSIS — N184 Chronic kidney disease, stage 4 (severe): Secondary | ICD-10-CM

## 2018-08-27 LAB — CBC
HCT: 31.7 % — ABNORMAL LOW (ref 39.0–52.0)
Hemoglobin: 10.1 g/dL — ABNORMAL LOW (ref 13.0–17.0)
MCH: 30.3 pg (ref 26.0–34.0)
MCHC: 31.9 g/dL (ref 30.0–36.0)
MCV: 95.2 fL (ref 80.0–100.0)
Platelets: 230 10*3/uL (ref 150–400)
RBC: 3.33 MIL/uL — ABNORMAL LOW (ref 4.22–5.81)
RDW: 13.3 % (ref 11.5–15.5)
WBC: 10.4 10*3/uL (ref 4.0–10.5)
nRBC: 0.8 % — ABNORMAL HIGH (ref 0.0–0.2)

## 2018-08-27 LAB — COMPREHENSIVE METABOLIC PANEL
ALT: 973 U/L — ABNORMAL HIGH (ref 0–44)
AST: 925 U/L — ABNORMAL HIGH (ref 15–41)
Albumin: 3.2 g/dL — ABNORMAL LOW (ref 3.5–5.0)
Alkaline Phosphatase: 65 U/L (ref 38–126)
Anion gap: 17 — ABNORMAL HIGH (ref 5–15)
BUN: 40 mg/dL — ABNORMAL HIGH (ref 8–23)
CO2: 23 mmol/L (ref 22–32)
Calcium: 8.1 mg/dL — ABNORMAL LOW (ref 8.9–10.3)
Chloride: 97 mmol/L — ABNORMAL LOW (ref 98–111)
Creatinine, Ser: 3.75 mg/dL — ABNORMAL HIGH (ref 0.61–1.24)
GFR calc Af Amer: 17 mL/min — ABNORMAL LOW (ref >60)
GFR calc non Af Amer: 15 mL/min — ABNORMAL LOW (ref >60)
Glucose, Bld: 91 mg/dL (ref 70–99)
Potassium: 3.6 mmol/L (ref 3.5–5.1)
Sodium: 137 mmol/L (ref 135–145)
Total Bilirubin: 0.6 mg/dL (ref 0.3–1.2)
Total Protein: 6.1 g/dL — ABNORMAL LOW (ref 6.5–8.1)

## 2018-08-27 LAB — BRAIN NATRIURETIC PEPTIDE: B Natriuretic Peptide: 1710 pg/mL — ABNORMAL HIGH (ref 0.0–100.0)

## 2018-08-27 LAB — PROCALCITONIN: Procalcitonin: 14.12 ng/mL

## 2018-08-27 LAB — TROPONIN I
Troponin I: 3.35 ng/mL (ref <0.03)
Troponin I: 2.9 ng/mL (ref <0.03)

## 2018-08-27 LAB — POTASSIUM: Potassium: 3.5 mmol/L (ref 3.5–5.1)

## 2018-08-27 LAB — MAGNESIUM: Magnesium: 2.4 mg/dL (ref 1.7–2.4)

## 2018-08-27 MED ORDER — CITALOPRAM HYDROBROMIDE 20 MG PO TABS
10.0000 mg | ORAL_TABLET | Freq: Every day | ORAL | Status: DC
  Administered 2018-08-28 – 2018-09-03 (×7): 10 mg via ORAL
  Filled 2018-08-27 (×7): qty 1

## 2018-08-27 MED ORDER — PIPERACILLIN-TAZOBACTAM 3.375 G IVPB
3.3750 g | Freq: Two times a day (BID) | INTRAVENOUS | Status: DC
  Administered 2018-08-27 – 2018-08-28 (×2): 3.375 g via INTRAVENOUS
  Filled 2018-08-27 (×2): qty 50

## 2018-08-27 MED ORDER — DIPHENHYDRAMINE HCL 25 MG PO CAPS
25.0000 mg | ORAL_CAPSULE | Freq: Every evening | ORAL | Status: DC | PRN
  Administered 2018-08-27: 25 mg via ORAL
  Filled 2018-08-27: qty 1

## 2018-08-27 MED ORDER — POTASSIUM CHLORIDE CRYS ER 20 MEQ PO TBCR
40.0000 meq | EXTENDED_RELEASE_TABLET | ORAL | Status: AC
  Administered 2018-08-27: 40 meq via ORAL
  Filled 2018-08-27: qty 2

## 2018-08-27 MED ORDER — POTASSIUM CHLORIDE CRYS ER 20 MEQ PO TBCR
40.0000 meq | EXTENDED_RELEASE_TABLET | Freq: Once | ORAL | Status: AC
  Administered 2018-08-27: 40 meq via ORAL
  Filled 2018-08-27: qty 2

## 2018-08-27 MED ORDER — SODIUM CHLORIDE 0.9 % IV SOLN
INTRAVENOUS | Status: DC | PRN
  Administered 2018-08-27: 20 mL via INTRAVENOUS

## 2018-08-27 NOTE — Progress Notes (Signed)
Pt is a little short of breath and feels like something is binding around his chest.  Hinton Dyer NP notified. Order for troponin and EKG done.

## 2018-08-27 NOTE — Progress Notes (Signed)
Updated pts wife Amal Saiki via telephone regarding plan of care and all questions were answered will continue to monitor and assess pt.  Marda Stalker, [place] Pager [phone] (please enter 7 digits) PCCM Consult Pager [phone] (please enter 7 digits)

## 2018-08-27 NOTE — Progress Notes (Signed)
CRITICAL VALUE ALERT  Critical Value:  Troponin 3.35  Date & Time Notied:  5/15 1455  Provider Notified: Hinton Dyer NP  Orders Received/Actions taken: repeat troponin in 6 hours

## 2018-08-27 NOTE — Progress Notes (Signed)
Patient had 7 beat run of vtach, Gary Aline, NP notified. Potassium add on ordered. Patient asymptomatic.  Gary Harmon

## 2018-08-27 NOTE — Progress Notes (Signed)
Patient transferred to room 240, placed on telemetry. Report given to Aniceto Boss, Therapist, sports. Dr. Mortimer Fries notified of patient's potassium and gave verbal order to replace with 40 meq PO. Will place order. Foley taken out per protocol. Gary Harmon

## 2018-08-27 NOTE — Progress Notes (Signed)
CRITICAL VALUE ALERT  Critical Value:  Troponin 2.90  Date & Time Notied:  08/27/18  Provider Notified: Dr. Jannifer Franklin   Orders Received/Actions taken: Patient asymptomatic, will trend q6. RN will continue to monitor.

## 2018-08-27 NOTE — Progress Notes (Signed)
[place], [place] 08/27/18  Subjective:   78 year old Caucasian male with past medical history hypertension, obesity, lower extremity edema, chronic systolic heart failure with ejection fraction of 40%, obstructive sleep apnea, gout, and depression  K improved to 3.6 today However, renal function is worse Patient is non-oliguric, UOP 475 cc No acute SOB   Objective:  Vital signs in last 24 hours:  Temp:  [98.2 F (36.8 C)-99.1 F (37.3 C)] 98.6 F (37 C) (05/14 0600) Pulse Rate:  [63-97] 97 (05/14 0600) Resp:  [11-26] 23 (05/14 0600) BP: (79-158)/(57-92) 158/92 (05/14 0600)  Weight change:  Filed Weights   08/25/18 0910 08/25/18 1234  Weight: 86.2 kg 89.2 kg    Intake/Output:    Intake/Output Summary (Last 24 hours) at 08/27/2018 0916 Last data filed at 08/27/2018 0606 Gross per 24 hour  Intake 1561.77 ml  Output 800 ml  Net 761.77 ml     Physical Exam: General: Critically ill appearing  HEENT Moist oral mucus membranes  Neck supple  Pulm/lungs Coarse breath sounds at bases, mils wheezing  CVS/Heart Regular, tachycardic  Abdomen:  Soft. NT  Extremities: + pitting edema  Neurologic: Alert and oriented  Skin: No acute rashes  Access:    Foley,      Basic Metabolic Panel:  Recent Labs  Lab 08/25/18 0913 08/25/18 1040 08/25/18 1300 08/25/18 1619 08/26/18 0143 08/26/18 0431 08/27/18 0449  NA 140  --   --   --   --  142 137  K <2.0* <2.0* 2.4* 3.8 3.3* 3.4* 3.6  CL 91*  --   --   --   --  101 97*  CO2 21*  --   --   --   --  25 23  GLUCOSE 310*  --   --   --   --  127* 91  BUN 21  --   --   --   --  29* 40*  CREATININE 1.70*  --   --   --   --  2.60* 3.75*  CALCIUM 10.6*  --   --   --   --  8.7* 8.1*  MG  --  0.8*  --   --  1.8  --   --      CBC: Recent Labs  Lab 08/25/18 0913 08/26/18 0431 08/27/18 0449  WBC 14.8* 12.5* 10.4  NEUTROABS 8.7* 10.6*  --   HGB 10.8* 10.7* 10.1*  HCT 34.8* 33.5* 31.7*  MCV 99.1  95.4 95.2  PLT 312 319 230     No results found for: HEPBSAG, HEPBSAB, HEPBIGM    Microbiology:  Recent Results (from the past 240 hour(s))  SARS Coronavirus 2 (CEPHEID - Performed in [place] hospital lab), Hosp Order     Status: None   Collection Time: 08/25/18  9:13 AM  Result Value Ref Range Status   SARS Coronavirus 2 NEGATIVE NEGATIVE Final    Comment: (NOTE) If result is NEGATIVE SARS-CoV-2 target nucleic acids are NOT DETECTED. The SARS-CoV-2 RNA is generally detectable in upper and lower  respiratory specimens during the acute phase of infection. The lowest  concentration of SARS-CoV-2 viral copies this assay can detect is 250  copies / mL. A negative result does not preclude SARS-CoV-2 infection  and should not be used as the sole basis for treatment or other  patient management decisions.  A negative result may occur with  improper specimen collection / handling, submission of specimen other  than  nasopharyngeal swab, presence of viral mutation(s) within the  areas targeted by this assay, and inadequate number of viral copies  (<250 copies / mL). A negative result must be combined with clinical  observations, patient history, and epidemiological information. If result is POSITIVE SARS-CoV-2 target nucleic acids are DETECTED. The SARS-CoV-2 RNA is generally detectable in upper and lower  respiratory specimens dur ing the acute phase of infection.  Positive  results are indicative of active infection with SARS-CoV-2.  Clinical  correlation with patient history and other diagnostic information is  necessary to determine patient infection status.  Positive results do  not rule out bacterial infection or co-infection with other viruses. If result is PRESUMPTIVE POSTIVE SARS-CoV-2 nucleic acids MAY BE PRESENT.   A presumptive positive result was obtained on the submitted specimen  and confirmed on repeat testing.  While 2019 novel coronavirus  (SARS-CoV-2) nucleic  acids may be present in the submitted sample  additional confirmatory testing may be necessary for epidemiological  and / or clinical management purposes  to differentiate between  SARS-CoV-2 and other Sarbecovirus currently known to infect humans.  If clinically indicated additional testing with an alternate test  methodology [phone]) is advised. The SARS-CoV-2 RNA is generally  detectable in upper and lower respiratory sp ecimens during the acute  phase of infection. The expected result is Negative. Fact Sheet for Patients:  StrictlyIdeas.no Fact Sheet for Healthcare Providers: BankingDealers.co.za This test is not yet approved or cleared by the [place] FDA and has been authorized for detection and/or diagnosis of SARS-CoV-2 by FDA under an Emergency Use Authorization (EUA).  This EUA will remain in effect (meaning this test can be used) for the duration of the COVID-19 declaration under Section 564(b)(1) of the Act, 21 U.S.C. section 360bbb-3(b)(1), unless the authorization is terminated or revoked sooner. Performed at [facility], [address] [place], [place] [address]   MRSA PCR Screening     Status: Abnormal   Collection Time: 08/25/18 12:37 PM  Result Value Ref Range Status   MRSA by PCR (A) NEGATIVE Final    INVALID, UNABLE TO DETERMINE THE PRESENCE OF TARGET DNA DUE TO SPECIMEN INTEGRITY. RECOLLECTION REQUESTED.    Comment: RESULT CALLED TO, READ BACK BY AND VERIFIED WITH: CALLED TO TAYLOR @1518  08/25/2018 [facility] Performed at [facility], [address] [place], [place] [address]   C difficile quick scan w PCR reflex     Status: None   Collection Time: 08/25/18  2:28 PM  Result Value Ref Range Status   C Diff antigen NEGATIVE NEGATIVE Final   C Diff toxin NEGATIVE NEGATIVE Final   C Diff interpretation No C. difficile detected.  Final    Comment: Performed at [facility], [address] [place], [place] [address]  Culture, Urine     Status: Abnormal (Preliminary result)   Collection Time: 08/26/18  2:51 AM  Result Value Ref Range Status   Specimen Description   Final    URINE, RANDOM Performed at [facility], [address] [place], [place] [address]    Special Requests   Final    Normal Performed at [facility], [address] [place], [place] [address]    Culture (A)  Final    >=100,000 COLONIES/mL ESCHERICHIA COLI SUSCEPTIBILITIES TO FOLLOW Performed at [facility], [place] [address] [place],  [address]    Report Status PENDING  Incomplete  Culture, respiratory (non-expectorated)     Status: None (  Preliminary result)   Collection Time: 08/26/18  5:27 AM  Result Value Ref Range Status   Specimen Description   Final    TRACHEAL ASPIRATE Performed at [facility], [address], [place], [place] [address]    Special Requests   Final    Normal Performed at [facility], [place], [place] [address]    Gram Stain   Final    NO WBC SEEN RARE GRAM POSITIVE COCCI IN PAIRS RARE GRAM POSITIVE RODS    Culture   Final    CULTURE REINCUBATED FOR BETTER GROWTH Performed at [facility], [place] [address], [place], [place] [address]    Report Status PENDING  Incomplete  CULTURE, BLOOD (ROUTINE X 2) w Reflex to ID Panel     Status: None (Preliminary result)   Collection Time: 08/26/18  5:59 AM  Result Value Ref Range Status   Specimen Description BLOOD RIGHT HAND  Final   Special Requests   Final    BOTTLES DRAWN AEROBIC AND ANAEROBIC Blood Culture adequate volume   Culture   Final    NO GROWTH 1 DAY Performed at [facility], [address] [place], [place] [address]    Report Status PENDING  Incomplete  CULTURE, BLOOD (ROUTINE X 2) w Reflex to ID Panel     Status: None (Preliminary result)   Collection Time: 08/26/18  6:11 AM  Result Value Ref Range Status   Specimen  Description BLOOD BLOOD LEFT WRIST  Final   Special Requests   Final    BOTTLES DRAWN AEROBIC AND ANAEROBIC Blood Culture results may not be optimal due to an inadequate volume of blood received in culture bottles   Culture   Final    NO GROWTH 1 DAY Performed at [facility], [address] [place], [place] [address]    Report Status PENDING  Incomplete  MRSA PCR Screening     Status: None   Collection Time: 08/26/18  9:31 AM  Result Value Ref Range Status   MRSA by PCR NEGATIVE NEGATIVE Final    Comment:        The GeneXpert MRSA Assay (FDA approved for NASAL specimens only), is one component of a comprehensive MRSA colonization surveillance program. It is not intended to diagnose MRSA infection nor to guide or monitor treatment for MRSA infections. Performed at [facility], [address] [place], [place] [address]     Coagulation Studies: Recent Labs    08/25/18 0913  LABPROT 16.7*  INR 1.4*    Urinalysis: Recent Labs    08/25/18 0913 08/26/18 0251  COLORURINE YELLOW* YELLOW*  LABSPEC 1.010 1.013  PHURINE 7.0 5.0  GLUCOSEU NEGATIVE NEGATIVE  HGBUR SMALL* MODERATE*  BILIRUBINUR NEGATIVE NEGATIVE  KETONESUR NEGATIVE NEGATIVE  PROTEINUR 30* 100*  NITRITE NEGATIVE NEGATIVE  LEUKOCYTESUR NEGATIVE MODERATE*      Imaging: Ct Head Wo Contrast  Result Date: 08/25/2018 CLINICAL DATA:  Cardiac arrest with resuscitation. EXAM: CT HEAD WITHOUT CONTRAST CT CERVICAL SPINE WITHOUT CONTRAST TECHNIQUE: Multidetector CT imaging of the head and cervical spine was performed following the standard protocol without intravenous contrast. Multiplanar CT image reconstructions of the cervical spine were also generated. COMPARISON:  CT head 07/28/2018 FINDINGS: CT HEAD FINDINGS Brain: Mild atrophy without hydrocephalus. Moderate chronic white matter changes. No acute infarct, hemorrhage, or mass. Vascular: Atherosclerotic calcification. Negative for hyperdense  vessel Skull: Negative Sinuses/Orbits: Negative Other: None CT CERVICAL SPINE FINDINGS Alignment: Normal Skull base  and vertebrae: Negative for fracture Soft tissues and spinal canal: 10 mm nodule left upper pole of the thyroid. Atherosclerotic calcification carotid bifurcation bilaterally. No soft tissue mass or adenopathy Disc levels: Disc degeneration and spurring most prominent at C5-6 and C6-7 causing spinal and foraminal stenosis bilaterally. Upper chest: Endotracheal tube and NG tube noted. Moderately large layering pleural effusions bilaterally with bibasilar atelectasis. Other: None IMPRESSION: 1. No acute intracranial abnormality. Atrophy and moderate chronic microvascular ischemic change 2. Negative for cervical spine fracture.  Cervical spondylosis 3. Moderately large bilateral pleural effusions. Electronically Signed   By: Franchot Gallo M.D.   On: 08/25/2018 10:11   Ct Cervical Spine Wo Contrast  Result Date: 08/25/2018 CLINICAL DATA:  Cardiac arrest with resuscitation. EXAM: CT HEAD WITHOUT CONTRAST CT CERVICAL SPINE WITHOUT CONTRAST TECHNIQUE: Multidetector CT imaging of the head and cervical spine was performed following the standard protocol without intravenous contrast. Multiplanar CT image reconstructions of the cervical spine were also generated. COMPARISON:  CT head 07/28/2018 FINDINGS: CT HEAD FINDINGS Brain: Mild atrophy without hydrocephalus. Moderate chronic white matter changes. No acute infarct, hemorrhage, or mass. Vascular: Atherosclerotic calcification. Negative for hyperdense vessel Skull: Negative Sinuses/Orbits: Negative Other: None CT CERVICAL SPINE FINDINGS Alignment: Normal Skull base and vertebrae: Negative for fracture Soft tissues and spinal canal: 10 mm nodule left upper pole of the thyroid. Atherosclerotic calcification carotid bifurcation bilaterally. No soft tissue mass or adenopathy Disc levels: Disc degeneration and spurring most prominent at C5-6 and C6-7 causing  spinal and foraminal stenosis bilaterally. Upper chest: Endotracheal tube and NG tube noted. Moderately large layering pleural effusions bilaterally with bibasilar atelectasis. Other: None IMPRESSION: 1. No acute intracranial abnormality. Atrophy and moderate chronic microvascular ischemic change 2. Negative for cervical spine fracture.  Cervical spondylosis 3. Moderately large bilateral pleural effusions. Electronically Signed   By: Franchot Gallo M.D.   On: 08/25/2018 10:11   Dg Chest Port 1 View  Result Date: 08/27/2018 CLINICAL DATA:  Respiratory failure. EXAM: PORTABLE CHEST 1 VIEW COMPARISON:  Radiograph yesterday. FINDINGS: Endotracheal and enteric tubes have been removed. Low lung volumes. Cardiomegaly is similar. Hazy opacity at both lung bases likely combination of pleural fluid and compressive atelectasis. Slight improvement in bilateral perihilar opacities. No visualized pneumothorax. IMPRESSION: Worsening bibasilar aeration with increasing hazy opacities likely combination of pleural effusion and compressive atelectasis. Slight improvement in bilateral perihilar opacities likely pulmonary edema. Electronically Signed   By: Keith Rake M.D.   On: 08/27/2018 02:53   Dg Chest Port 1 View  Result Date: 08/26/2018 CLINICAL DATA:  78 year old male respiratory failure. Negative for COVID-19. Yesterday. EXAM: PORTABLE CHEST 1 VIEW COMPARISON:  08/25/2018, 07/10/2015. FINDINGS: Portable AP semi upright view at 0452 hours. Endotracheal tube tip in good position between the level the clavicles and carina. Enteric tube courses to the abdomen, tip not included. Pacer or resuscitation pads again project about the chest. Increased interstitial and veiling opacity in both lungs since yesterday, greater on the right. Stable cardiac size and mediastinal contours. No pneumothorax. No air bronchograms, although increased retrocardiac opacity obscuring the diaphragm. Stable visualized osseous structures.  IMPRESSION: 1.  Stable lines and tubes. 2. Worsening interstitial in veiling opacity in both lungs. Favor acute pulmonary edema with pleural effusions, and lower lobe collapse or consolidation. Electronically Signed   By: Genevie Ann M.D.   On: 08/26/2018 06:41   Dg Chest Portable 1 View  Result Date: 08/25/2018 CLINICAL DATA:  Cardiac arrest. EXAM: PORTABLE CHEST 1 VIEW COMPARISON:  07/10/2015 FINDINGS:  Endotracheal tube tip is 6 cm above the carina. Orogastric or nasogastric tube enters the stomach. Heart is enlarged. There is widespread pulmonary density most consistent with edema. Aspiration not excluded. No visible effusion. IMPRESSION: Endotracheal tube and orogastric or nasogastric tube well positioned. Widespread pulmonary density probably representing edema. Some coexistent aspiration not excluded. Electronically Signed   By: Nelson Chimes M.D.   On: 08/25/2018 10:00   Dg Abd Portable 1 View  Result Date: 08/25/2018 CLINICAL DATA:  Cardiac arrest. EXAM: PORTABLE ABDOMEN - 1 VIEW COMPARISON:  None. FINDINGS: Orogastric tube has its tip in the mid body of the stomach. Gas pattern appears unremarkable. No abnormal calcifications. Ordinary degenerative changes affect the spine IMPRESSION: Orogastric tube tip in the mid body of the stomach Electronically Signed   By: Nelson Chimes M.D.   On: 08/25/2018 10:01     Medications:   . phenylephrine (NEO-SYNEPHRINE) Adult infusion Stopped (08/27/18 0307)  . piperacillin-tazobactam (ZOSYN)  IV 3.375 g (08/27/18 0606)   . heparin  5,000 Units Subcutaneous Q8H  . mouth rinse  15 mL Mouth Rinse BID     Assessment/ Plan:  78 y.o. Caucasian male  with past medical history hypertension, obesity, lower extremity edema, chronic systolic heart failure with ejection fraction of 40%, obstructive sleep apnea, gout, and depression  1. Severe Hypokalemia 2. AKI on CKD st 3. Baseline Cr 1.50/GFR 45 on 08/17/2018 3. Hypomagnesemia 4. Acute resp failure, post  outpatient cardiac arrest 5. LE Edema  Plan: AKI likely due to ATN, non oliguric Monitor UOP Agree with further potassium replacement Electrolytes and Volume status are acceptable No acute indication for Dialysis at present     LOS: [place] 5/14/20209:16 AM  [place], [place]  Note: This note was prepared with Dragon dictation. Any transcription errors are unintentional

## 2018-08-27 NOTE — Progress Notes (Signed)
[facility] NAME: Dejohn Ibarra    MR#:  [id number]  DATE OF BIRTH:  12-25-1940  SUBJECTIVE:   Patient extubated. Has some coughing. No new complaints REVIEW OF SYSTEMS:   Review of Systems  Constitutional: Negative for chills, fever and weight loss.  HENT: Negative for ear discharge, ear pain and nosebleeds.   Eyes: Negative for blurred vision, pain and discharge.  Respiratory: Positive for cough and shortness of breath. Negative for sputum production, wheezing and stridor.   Cardiovascular: Positive for orthopnea. Negative for chest pain, palpitations and PND.  Gastrointestinal: Negative for abdominal pain, diarrhea, nausea and vomiting.  Genitourinary: Negative for frequency and urgency.  Musculoskeletal: Negative for back pain and joint pain.  Neurological: Negative for sensory change, speech change, focal weakness and weakness.  Psychiatric/Behavioral: Negative for depression and hallucinations. The patient is not nervous/anxious.    Tolerating Diet:yes Tolerating PT: pending  DRUG ALLERGIES:  No Known Allergies  VITALS:  Blood pressure 136/69, pulse (!) 101, temperature 99.5 F (37.5 C), resp. rate (!) 22, height 5\' 7"  (1.702 m), weight 89.2 kg, SpO2 100 %.  PHYSICAL EXAMINATION:   Physical Exam  GENERAL:  78 y.o.-year-old patient lying in the bed with no acute distress.  EYES: Pupils equal, round, reactive to light and accommodation. No scleral icterus. Extraocular muscles intact.  HEENT: Head atraumatic, normocephalic. Oropharynx and nasopharynx clear.  NECK:  Supple, no jugular venous distention. No thyroid enlargement, no tenderness.  LUNGS:decreased breath sounds bilaterally, no wheezing, rales, rhonchi. No use of accessory muscles of respiration.  CARDIOVASCULAR: S1, S2 normal. No murmurs, rubs, or gallops.  ABDOMEN: Soft, nontender, nondistended. Bowel sounds present. No organomegaly or mass.   EXTREMITIES: No cyanosis, clubbing or edema b/l.    NEUROLOGIC: Cranial nerves II through XII are intact. No focal Motor or sensory deficits b/l.   PSYCHIATRIC:  patient is alert and oriented x 3.  SKIN: No obvious rash, lesion, or ulcer.   LABORATORY PANEL:  CBC Recent Labs  Lab 08/27/18 0449  WBC 10.4  HGB 10.1*  HCT 31.7*  PLT 230    Chemistries  Recent Labs  Lab 08/27/18 0449  NA 137  K 3.6  CL 97*  CO2 23  GLUCOSE 91  BUN 40*  CREATININE 3.75*  CALCIUM 8.1*  MG 2.4  AST 925*  ALT 973*  ALKPHOS 65  BILITOT 0.6   Cardiac Enzymes Recent Labs  Lab 08/25/18 0913  TROPONINI 0.82*   RADIOLOGY:  Dg Chest Port 1 View  Result Date: 08/27/2018 CLINICAL DATA:  Respiratory failure. EXAM: PORTABLE CHEST 1 VIEW COMPARISON:  Radiograph yesterday. FINDINGS: Endotracheal and enteric tubes have been removed. Low lung volumes. Cardiomegaly is similar. Hazy opacity at both lung bases likely combination of pleural fluid and compressive atelectasis. Slight improvement in bilateral perihilar opacities. No visualized pneumothorax. IMPRESSION: Worsening bibasilar aeration with increasing hazy opacities likely combination of pleural effusion and compressive atelectasis. Slight improvement in bilateral perihilar opacities likely pulmonary edema. Electronically Signed   By: Keith Rake M.D.   On: 08/27/2018 02:53   Dg Chest Port 1 View  Result Date: 08/26/2018 CLINICAL DATA:  78 year old male respiratory failure. Negative for COVID-19. Yesterday. EXAM: PORTABLE CHEST 1 VIEW COMPARISON:  08/25/2018, 07/10/2015. FINDINGS: Portable AP semi upright view at 0452 hours. Endotracheal tube tip in good position between the level the clavicles and carina. Enteric tube courses to the abdomen, tip not included. Pacer or resuscitation pads again project  about the chest. Increased interstitial and veiling opacity in both lungs since yesterday, greater on the right. Stable cardiac size and mediastinal  contours. No pneumothorax. No air bronchograms, although increased retrocardiac opacity obscuring the diaphragm. Stable visualized osseous structures. IMPRESSION: 1.  Stable lines and tubes. 2. Worsening interstitial in veiling opacity in both lungs. Favor acute pulmonary edema with pleural effusions, and lower lobe collapse or consolidation. Electronically Signed   By: Genevie Ann M.D.   On: 08/26/2018 06:41   ASSESSMENT AND PLAN:  Cari Vandeberg  is a 78 y.o. male with a known history of congestive heart failure systolic, chronic kidney disease stage III, hypertension, gout comes to the emergency room after he had a cardiac arrest at [employer] while shopping. According to the wife and ER records patient was having a routine day and while at checkout per wife he all of a sudden collapsed.   1. Cardiorespiratory arrest. Patient now off ventilator -suspected due to severe electrolyte abnormality  2. Severe electrolyte abnormality with hypokalemia and severe hypomagnesimia -patient currently getting IV potassium and IV magnesium--repeleted -patient recently had hyperkalemia came to the emergency room was taken of his potassium and lokelma was started -now getting po daily KCL  3. Chronic kidney disease stage IV follows with Dr. Holley Raring -baseline creatinine around 1.5--2.6--3.75 -spoke with Dr Candiss Norse -Received IV lasix 40 x 1  4. Elevated troponin in the setting of cardiac arrest -no history of CAD per family -Dr. Bethanne Ginger input appreciated  5. Mild leukocytosis appears reactive  6. DVT prophylaxis subcu heparin  7/patient has history of cardiomyopathy with EF of 30 to 35% -x-ray questionable pulmonary edema versus aspiration and E coli UTI -empirically on Zosyn    Discussed with patient's daughter CODE STATUS: full  DVT Prophylaxis: heparin TOTAL TIME TAKING CARE OF THIS PATIENT: *30* minutes.  >50% time spent on counselling and coordination of care  POSSIBLE D/C IN *1-2* DAYS, DEPENDING  ON CLINICAL CONDITION.  Note: This dictation was prepared with Dragon dictation along with smaller phrase technology. Any transcriptional errors that result from this process are unintentional.  Fritzi Mandes M.D on 08/27/2018 at 1:19 PM  Between 7am to 6pm - Pager - [phone]  After 6pm go to [email or web address] - password [email or web address] [facility]  Office  [phone]  CC: Primary care physician; Valera Castle, MDPatient ID: Jason Fila, male   DOB: Mar 06, 1941, 78 y.o.   MRN: [id number]

## 2018-08-27 NOTE — Consult Note (Signed)
Pharmacy Antibiotic Note  Gary Harmon is a 78 y.o. male admitted on 08/25/2018 with cardiac arrest Pharmacy has been consulted for Zosyn dosing for aspiration PNA.  Plan: Patient with decrease in renal function, will decrease Zosyn 3.375 IV EI to every 12 hours.  Height: 5\' 7"  (170.2 cm) Weight: 196 lb 10.4 oz (89.2 kg) IBW/kg (Calculated) : 66.1  Temp (24hrs), Avg:98.8 F (37.1 C), Min:98.2 F (36.8 C), Max:99.1 F (37.3 C)  Recent Labs  Lab 08/25/18 0913 08/26/18 0431 08/27/18 0449  WBC 14.8* 12.5* 10.4  CREATININE 1.70* 2.60* 3.75*    Estimated Creatinine Clearance: 18.1 mL/min (A) (by C-G formula based on SCr of 3.75 mg/dL (H)).    No Known Allergies  Antimicrobials this admission: 5/13 ceftriaxone  >> x1 dose 5/13 Zosyn >>   Microbiology results: 5/13 BCx: pending 5/13 UCx: pending 5/12 MRSA PCR: pending 5/12 SARS Coronavirus 2: negative  Thank you for allowing pharmacy to be a part of this patient's care.  Paulina Fusi, PharmD, BCPS 08/27/2018 10:59 AM

## 2018-08-27 NOTE — Progress Notes (Addendum)
Name: Vencil Basnett MRN: [id number] DOB: 19-Jun-1940    ADMISSION DATE:  08/25/2018  BRIEF PATIENT DESCRIPTION:  78 yo male admitted following V. Fib and PEA arrest ROSC following 40 minutes of ACLS required mechanical intubation   SIGNIFICANT EVENTS/STUDIES:  05/12-Pt admitted to ICU mechanically intubated post cardiac arrest not a candidate for hypothermic protocol purposeful movement post cardiac arrest  05/12 CT Cervical Spine/Head no acute intracranial abnormality. Atrophy and moderate chronic microvascular ischemic change. Negative for cervical spine fracture.  Cervical spondylosis. Moderately large bilateral pleural effusions. 05/13-Pt successfully extubate  05/13 Echo moderate-severely reduced systolic function, with an ejection fraction of 30-35%. The cavity size was mildly dilated. Left ventricular diastolic parameters were normal.  CULTURES: Respiratory 05/13>> Urine 05/13>>Positive for ecoli   Blooc x2>>Negative   PAST MEDICAL HISTORY :   has a past medical history of Gout, Hypertension, and Rheumatic fever.  has a past surgical history that includes Cataract extraction w/PHACO (Left, 09/26/2014); Hammer toe surgery; and Cardiac catheterization (Right, 10/13/2015).  SUBJECTIVE:  No complaints at this time   VITAL SIGNS: Temp:  [98.2 F (36.8 C)-99.1 F (37.3 C)] 98.8 F (37.1 C) (05/14 0900) Pulse Rate:  [63-102] 102 (05/14 0900) Resp:  [11-26] 24 (05/14 0900) BP: (79-158)/(57-96) 142/96 (05/14 0900)  PHYSICAL EXAMINATION: General: well developed, well nourished male, NAD Neuro: alert and oriented, follows commands  HEENT: supple, no JVD  Cardiovascular: nsr, rrr, no R/G  Lungs: faint wheezes throughout, even, non labored Abdomen: +BS x4, soft, obese, non tender, non distended  Musculoskeletal: normal bulk and tone, no edema  Skin: intact no rashes or lesions present   Recent Labs  Lab 08/25/18 0913  08/26/18 0143 08/26/18 0431 08/27/18 0449  NA 140  --   --   142 137  K <2.0*   < > 3.3* 3.4* 3.6  CL 91*  --   --  101 97*  CO2 21*  --   --  25 23  BUN 21  --   --  29* 40*  CREATININE 1.70*  --   --  2.60* 3.75*  GLUCOSE 310*  --   --  127* 91   < > = values in this interval not displayed.   Recent Labs  Lab 08/25/18 0913 08/26/18 0431 08/27/18 0449  HGB 10.8* 10.7* 10.1*  HCT 34.8* 33.5* 31.7*  WBC 14.8* 12.5* 10.4  PLT 312 319 230   Dg Chest Port 1 View  Result Date: 08/27/2018 CLINICAL DATA:  Respiratory failure. EXAM: PORTABLE CHEST 1 VIEW COMPARISON:  Radiograph yesterday. FINDINGS: Endotracheal and enteric tubes have been removed. Low lung volumes. Cardiomegaly is similar. Hazy opacity at both lung bases likely combination of pleural fluid and compressive atelectasis. Slight improvement in bilateral perihilar opacities. No visualized pneumothorax. IMPRESSION: Worsening bibasilar aeration with increasing hazy opacities likely combination of pleural effusion and compressive atelectasis. Slight improvement in bilateral perihilar opacities likely pulmonary edema. Electronically Signed   By: Keith Rake M.D.   On: 08/27/2018 02:53   Dg Chest Port 1 View  Result Date: 08/26/2018 CLINICAL DATA:  78 year old male respiratory failure. Negative for COVID-19. Yesterday. EXAM: PORTABLE CHEST 1 VIEW COMPARISON:  08/25/2018, 07/10/2015. FINDINGS: Portable AP semi upright view at 0452 hours. Endotracheal tube tip in good position between the level the clavicles and carina. Enteric tube courses to the abdomen, tip not included. Pacer or resuscitation pads again project about the chest. Increased interstitial and veiling opacity in both lungs since yesterday, greater on the right. Stable  cardiac size and mediastinal contours. No pneumothorax. No air bronchograms, although increased retrocardiac opacity obscuring the diaphragm. Stable visualized osseous structures. IMPRESSION: 1.  Stable lines and tubes. 2. Worsening interstitial in veiling opacity in  both lungs. Favor acute pulmonary edema with pleural effusions, and lower lobe collapse or consolidation. Electronically Signed   By: Genevie Ann M.D.   On: 08/26/2018 06:41    ASSESSMENT / PLAN:  Acute hypoxic respiratory failure s/p cardiac arrest secondary to pulmonary edema and suspected aspiration  Supplemental O2 for dyspnea and/or hypoxia  Prn bronchodilator therapy   V.fib/PEA arrest likely secondary to hypokalemia  Cardiogenic shock-resolved  Mildly elevated troponin likely secondary to cardiac arrest  Chronic Systolic CHF (EF 11-65% via Echo 08/26/2018) Hx: Paroxysmal Atrial Fibrillation  Continuous telemetry monitoring  Continue to replace electrolytes  Cardiology consulted appreciate input  Acute on chronic renal failure (baseline creatinine 1.50/GFR 45 on 08/17/2018) Trend BMP  Replace electrolytes as indicated  Monitor UOP Avoid nephrotoxic medications Nephrology consulted appreciate input   Suspected aspiration pneumonia  Pyuria  Trend WBC and monitor fever curve  Will check PCT in the am Continue zosyn  Anemia without obvious acute blood loss  VTE px: subq heparin Trend CBC Monitor for s/sx of bleeding and transfuse for hgb <7  Elevated liver enzymes post prolonged cardiac arrest Trend hepatic panel   -Pt stable for transfer to telemetry unit  Marda Stalker, Arnold Line Pager [phone] (please enter 7 digits) PCCM Consult Pager [phone] (please enter 7 digits)

## 2018-08-28 LAB — CBC WITH DIFFERENTIAL/PLATELET
Abs Immature Granulocytes: 0.08 10*3/uL — ABNORMAL HIGH (ref 0.00–0.07)
Basophils Absolute: 0.1 10*3/uL (ref 0.0–0.1)
Basophils Relative: 1 %
Eosinophils Absolute: 0.2 10*3/uL (ref 0.0–0.5)
Eosinophils Relative: 2 %
HCT: 31.6 % — ABNORMAL LOW (ref 39.0–52.0)
Hemoglobin: 10.2 g/dL — ABNORMAL LOW (ref 13.0–17.0)
Immature Granulocytes: 1 %
Lymphocytes Relative: 11 %
Lymphs Abs: 1 10*3/uL (ref 0.7–4.0)
MCH: 30 pg (ref 26.0–34.0)
MCHC: 32.3 g/dL (ref 30.0–36.0)
MCV: 92.9 fL (ref 80.0–100.0)
Monocytes Absolute: 0.5 10*3/uL (ref 0.1–1.0)
Monocytes Relative: 6 %
Neutro Abs: 7.4 10*3/uL (ref 1.7–7.7)
Neutrophils Relative %: 79 %
Platelets: 251 10*3/uL (ref 150–400)
RBC: 3.4 MIL/uL — ABNORMAL LOW (ref 4.22–5.81)
RDW: 13.3 % (ref 11.5–15.5)
WBC: 9.2 10*3/uL (ref 4.0–10.5)
nRBC: 0.3 % — ABNORMAL HIGH (ref 0.0–0.2)

## 2018-08-28 LAB — TROPONIN I: Troponin I: 2.74 ng/mL (ref <0.03)

## 2018-08-28 LAB — COMPREHENSIVE METABOLIC PANEL
ALT: 708 U/L — ABNORMAL HIGH (ref 0–44)
AST: 354 U/L — ABNORMAL HIGH (ref 15–41)
Albumin: 2.8 g/dL — ABNORMAL LOW (ref 3.5–5.0)
Alkaline Phosphatase: 71 U/L (ref 38–126)
Anion gap: 16 — ABNORMAL HIGH (ref 5–15)
BUN: 52 mg/dL — ABNORMAL HIGH (ref 8–23)
CO2: 21 mmol/L — ABNORMAL LOW (ref 22–32)
Calcium: 8.1 mg/dL — ABNORMAL LOW (ref 8.9–10.3)
Chloride: 100 mmol/L (ref 98–111)
Creatinine, Ser: 4.98 mg/dL — ABNORMAL HIGH (ref 0.61–1.24)
GFR calc Af Amer: 12 mL/min — ABNORMAL LOW (ref >60)
GFR calc non Af Amer: 11 mL/min — ABNORMAL LOW (ref >60)
Glucose, Bld: 113 mg/dL — ABNORMAL HIGH (ref 70–99)
Potassium: 4.3 mmol/L (ref 3.5–5.1)
Sodium: 137 mmol/L (ref 135–145)
Total Bilirubin: 0.7 mg/dL (ref 0.3–1.2)
Total Protein: 5.9 g/dL — ABNORMAL LOW (ref 6.5–8.1)

## 2018-08-28 LAB — URINE CULTURE
Culture: >=100000 — AB
Special Requests: NORMAL

## 2018-08-28 LAB — PROCALCITONIN: Procalcitonin: 8.88 ng/mL

## 2018-08-28 LAB — CULTURE, RESPIRATORY W GRAM STAIN
Gram Stain: NONE SEEN
Special Requests: NORMAL

## 2018-08-28 MED ORDER — LOPERAMIDE HCL 2 MG PO CAPS
2.0000 mg | ORAL_CAPSULE | Freq: Four times a day (QID) | ORAL | Status: DC | PRN
  Administered 2018-08-28: 2 mg via ORAL
  Filled 2018-08-28: qty 1

## 2018-08-28 MED ORDER — AMOXICILLIN-POT CLAVULANATE 875-125 MG PO TABS: 1.0000 | ORAL_TABLET | Freq: Two times a day (BID) | ORAL | Status: DC

## 2018-08-28 MED ORDER — ALPRAZOLAM 0.25 MG PO TABS
0.2500 mg | ORAL_TABLET | Freq: Every day | ORAL | Status: DC | PRN
  Administered 2018-08-28: 0.25 mg via ORAL
  Filled 2018-08-28: qty 1

## 2018-08-28 MED ORDER — AMOXICILLIN-POT CLAVULANATE 500-125 MG PO TABS
1.0000 | ORAL_TABLET | Freq: Two times a day (BID) | ORAL | Status: DC
  Administered 2018-08-28 – 2018-09-03 (×13): 500 mg via ORAL
  Filled 2018-08-28 (×14): qty 1

## 2018-08-28 MED ORDER — DIPHENHYDRAMINE HCL 25 MG PO CAPS
25.0000 mg | ORAL_CAPSULE | Freq: Once | ORAL | Status: AC
  Administered 2018-08-28: 21:00:00 25 mg via ORAL
  Filled 2018-08-28: qty 1

## 2018-08-28 MED ORDER — CARVEDILOL 3.125 MG PO TABS
3.1250 mg | ORAL_TABLET | Freq: Two times a day (BID) | ORAL | Status: DC
  Administered 2018-08-28 – 2018-09-03 (×12): 3.125 mg via ORAL
  Filled 2018-08-28 (×12): qty 1

## 2018-08-28 NOTE — Progress Notes (Signed)
Patient Name: Gary Harmon Date of Encounter: 08/28/2018  Hospital Problem List     Active Problems:   Cardiorespiratory arrest [facility])    Patient Profile     78 year old male admitted after a syncopal episode noted to be profoundly hypomagnesemic and hypokalemic.  Also has renal insufficiency with acute on chronic renal failure with current creatinine of 4.98 up from 1.7 on admission.  Potassium 4.3 today.  BUN 52.  Somewhat confused today.  Troponin elevated likely secondary to renal insufficiency.  Does not appear to have an acute coronary syndrome.  Subjective   No complaints but somewhat confused  Inpatient Medications    . citalopram  10 mg Oral Daily  . heparin  5,000 Units Subcutaneous Q8H  . mouth rinse  15 mL Mouth Rinse BID    Vital Signs    Vitals:   08/27/18 2021 08/28/18 0432 08/28/18 0636 08/28/18 0730  BP: (!) 155/90 (!) 137/98 (!) 144/93 136/86  Pulse: (!) 103 95 96 87  Resp:      Temp: 98.9 F (37.2 C) 97.8 F (36.6 C) 97.7 F (36.5 C) 97.8 F (36.6 C)  TempSrc: Oral Oral Oral Oral  SpO2: 92% 98% 96% 100%  Weight:      Height:        Intake/Output Summary (Last 24 hours) at 08/28/2018 7035 Last data filed at 08/28/2018 0200 Gross per 24 hour  Intake 58.51 ml  Output 350 ml  Net -291.49 ml   Filed Weights   08/25/18 0910 08/25/18 1234 08/27/18 1659  Weight: 86.2 kg 89.2 kg 92.9 kg    Physical Exam    GEN: Well nourished, well developed, in no acute distress.  HEENT: normal.  Neck: Supple, no JVD, carotid bruits, or masses. Cardiac: RRR, no murmurs, rubs, or gallops. No clubbing, cyanosis, edema.  Radials/DP/PT 2+ and equal bilaterally.  Respiratory:  Respirations regular and unlabored, clear to auscultation bilaterally. GI: Soft, nontender, nondistended, BS + x 4. MS: no deformity or atrophy. Skin: warm and dry, no rash. Neuro:  Strength and sensation are intact.   Labs    CBC Recent Labs    08/26/18 0431 08/27/18 0449  08/28/18 0655  WBC 12.5* 10.4 9.2  NEUTROABS 10.6*  --  7.4  HGB 10.7* 10.1* 10.2*  HCT 33.5* 31.7* 31.6*  MCV 95.4 95.2 92.9  PLT 319 230 009   Basic Metabolic Panel Recent Labs    08/26/18 0143  08/27/18 0449 08/27/18 1418 08/28/18 0655  NA  --    < > 137  --  137  K 3.3*   < > 3.6 3.5 4.3  CL  --    < > 97*  --  100  CO2  --    < > 23  --  21*  GLUCOSE  --    < > 91  --  113*  BUN  --    < > 40*  --  52*  CREATININE  --    < > 3.75*  --  4.98*  CALCIUM  --    < > 8.1*  --  8.1*  MG 1.8  --  2.4  --   --    < > = values in this interval not displayed.   Liver Function Tests Recent Labs    08/27/18 0449 08/28/18 0655  AST 925* 354*  ALT 973* 708*  ALKPHOS 65 71  BILITOT 0.6 0.7  PROT 6.1* 5.9*  ALBUMIN 3.2* 2.8*   No results  for input(s): LIPASE, AMYLASE in the last 72 hours. Cardiac Enzymes Recent Labs    08/27/18 1418 08/27/18 2046 08/28/18 0655  TROPONINI 3.35* 2.90* 2.74*   BNP Recent Labs    08/26/18 0431 08/27/18 0449  BNP 2,063.0* 1,710.0*   D-Dimer No results for input(s): DDIMER in the last 72 hours. Hemoglobin A1C No results for input(s): HGBA1C in the last 72 hours. Fasting Lipid Panel No results for input(s): CHOL, HDL, LDLCALC, TRIG, CHOLHDL, LDLDIRECT in the last 72 hours. Thyroid Function Tests No results for input(s): TSH, T4TOTAL, T3FREE, THYROIDAB in the last 72 hours.  Invalid input(s): FREET3  Telemetry    nsr  ECG       Radiology    Ct Head Wo Contrast  Result Date: 08/27/2018 CLINICAL DATA:  Recent cardiac arrest with fall and posterior laceration EXAM: CT HEAD WITHOUT CONTRAST TECHNIQUE: Contiguous axial images were obtained from the base of the skull through the vertex without intravenous contrast. COMPARISON:  08/25/2018 FINDINGS: Brain: Chronic atrophic and white matter ischemic changes are noted. No findings to suggest acute hemorrhage, acute infarction or space-occupying mass lesion are noted. Vascular: No  hyperdense vessel or unexpected calcification. Skull: Normal. Negative for fracture or focal lesion. Sinuses/Orbits: No acute finding. Other: None. IMPRESSION: Chronic white matter ischemic change and atrophy without acute abnormality. Electronically Signed   By: Inez Catalina M.D.   On: 08/27/2018 13:47   Ct Head Wo Contrast  Result Date: 08/25/2018 CLINICAL DATA:  Cardiac arrest with resuscitation. EXAM: CT HEAD WITHOUT CONTRAST CT CERVICAL SPINE WITHOUT CONTRAST TECHNIQUE: Multidetector CT imaging of the head and cervical spine was performed following the standard protocol without intravenous contrast. Multiplanar CT image reconstructions of the cervical spine were also generated. COMPARISON:  CT head 07/28/2018 FINDINGS: CT HEAD FINDINGS Brain: Mild atrophy without hydrocephalus. Moderate chronic white matter changes. No acute infarct, hemorrhage, or mass. Vascular: Atherosclerotic calcification. Negative for hyperdense vessel Skull: Negative Sinuses/Orbits: Negative Other: None CT CERVICAL SPINE FINDINGS Alignment: Normal Skull base and vertebrae: Negative for fracture Soft tissues and spinal canal: 10 mm nodule left upper pole of the thyroid. Atherosclerotic calcification carotid bifurcation bilaterally. No soft tissue mass or adenopathy Disc levels: Disc degeneration and spurring most prominent at C5-6 and C6-7 causing spinal and foraminal stenosis bilaterally. Upper chest: Endotracheal tube and NG tube noted. Moderately large layering pleural effusions bilaterally with bibasilar atelectasis. Other: None IMPRESSION: 1. No acute intracranial abnormality. Atrophy and moderate chronic microvascular ischemic change 2. Negative for cervical spine fracture.  Cervical spondylosis 3. Moderately large bilateral pleural effusions. Electronically Signed   By: Franchot Gallo M.D.   On: 08/25/2018 10:11   Ct Cervical Spine Wo Contrast  Result Date: 08/25/2018 CLINICAL DATA:  Cardiac arrest with resuscitation. EXAM:  CT HEAD WITHOUT CONTRAST CT CERVICAL SPINE WITHOUT CONTRAST TECHNIQUE: Multidetector CT imaging of the head and cervical spine was performed following the standard protocol without intravenous contrast. Multiplanar CT image reconstructions of the cervical spine were also generated. COMPARISON:  CT head 07/28/2018 FINDINGS: CT HEAD FINDINGS Brain: Mild atrophy without hydrocephalus. Moderate chronic white matter changes. No acute infarct, hemorrhage, or mass. Vascular: Atherosclerotic calcification. Negative for hyperdense vessel Skull: Negative Sinuses/Orbits: Negative Other: None CT CERVICAL SPINE FINDINGS Alignment: Normal Skull base and vertebrae: Negative for fracture Soft tissues and spinal canal: 10 mm nodule left upper pole of the thyroid. Atherosclerotic calcification carotid bifurcation bilaterally. No soft tissue mass or adenopathy Disc levels: Disc degeneration and spurring most prominent at C5-6 and C6-7  causing spinal and foraminal stenosis bilaterally. Upper chest: Endotracheal tube and NG tube noted. Moderately large layering pleural effusions bilaterally with bibasilar atelectasis. Other: None IMPRESSION: 1. No acute intracranial abnormality. Atrophy and moderate chronic microvascular ischemic change 2. Negative for cervical spine fracture.  Cervical spondylosis 3. Moderately large bilateral pleural effusions. Electronically Signed   By: Franchot Gallo M.D.   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 FINDINGS: Right Kidney: Renal measurements: 7.7 x 4.0 x 3.4 cm = volume: 54 mL. Negative for hydronephrosis. Cortical thinning with small renal volume. Left Kidney: Renal measurements: 10.6 x 4.8 x 9.4 cm = volume: 117 mL. Echogenicity within normal limits. No mass or hydronephrosis visualized. Bladder: Enlarged prostate projecting into the base of  the bladder. Bladder volume normal. No significant bladder wall thickening. IMPRESSION: Negative for hydronephrosis.  Small volume right kidney 54 mL Significant enlargement the prostate projecting into the bladder. Electronically Signed   By: Franchot Gallo M.D.   On: 07/30/2018 14:45   Dg Chest Port 1 View  Result Date: 08/27/2018 CLINICAL DATA:  Respiratory failure. EXAM: PORTABLE CHEST 1 VIEW COMPARISON:  Radiograph yesterday. FINDINGS: Endotracheal and enteric tubes have been removed. Low lung volumes. Cardiomegaly is similar. Hazy opacity at both lung bases likely combination of pleural fluid and compressive atelectasis. Slight improvement in bilateral perihilar opacities. No visualized pneumothorax. IMPRESSION: Worsening bibasilar aeration with increasing hazy opacities likely combination of pleural effusion and compressive atelectasis. Slight improvement in bilateral perihilar opacities likely pulmonary edema. Electronically Signed   By: Keith Rake M.D.   On: 08/27/2018 02:53   Dg Chest Port 1 View  Result Date: 08/26/2018 CLINICAL DATA:  79 year old male respiratory failure. Negative for COVID-19. Yesterday. EXAM: PORTABLE CHEST 1 VIEW COMPARISON:  08/25/2018, 07/10/2015. FINDINGS: Portable AP semi upright view at 0452 hours. Endotracheal tube tip in good position between the level the clavicles and carina. Enteric tube courses to the abdomen, tip not included. Pacer or resuscitation pads again project about the chest. Increased interstitial and veiling opacity in both lungs since yesterday, greater on the right. Stable cardiac size and mediastinal contours. No pneumothorax. No air bronchograms, although increased retrocardiac opacity obscuring the diaphragm. Stable visualized osseous structures. IMPRESSION: 1.  Stable lines and tubes. 2. Worsening interstitial in veiling opacity in both lungs. Favor acute pulmonary edema with pleural effusions, and lower lobe collapse or consolidation.  Electronically Signed   By: Genevie Ann M.D.   On: 08/26/2018 06:41   Dg Chest Portable 1 View  Result Date: 08/25/2018 CLINICAL DATA:  Cardiac arrest. EXAM: PORTABLE CHEST 1 VIEW COMPARISON:  07/10/2015 FINDINGS: Endotracheal tube tip is 6 cm above the carina. Orogastric or nasogastric tube enters the stomach. Heart is enlarged. There is widespread pulmonary density most consistent with edema. Aspiration not excluded. No visible effusion. IMPRESSION: Endotracheal tube and orogastric or nasogastric tube well positioned. Widespread pulmonary density probably representing edema. Some coexistent aspiration not excluded. Electronically Signed   By: Nelson Chimes M.D.   On: 08/25/2018 10:00   Dg Abd Portable 1 View  Result Date: 08/25/2018 CLINICAL DATA:  Cardiac arrest. EXAM: PORTABLE ABDOMEN - 1 VIEW COMPARISON:  None. FINDINGS: Orogastric tube has its tip in the mid body of the stomach. Gas pattern appears unremarkable. No abnormal calcifications. Ordinary degenerative changes affect the spine IMPRESSION: Orogastric tube tip in the mid  body of the stomach Electronically Signed   By: Nelson Chimes M.D.   On: 08/25/2018 10:01   Vas US Renal Artery Duplex  Result Date: 08/18/2018 ABDOMINAL VISCERAL Indications: Chronic kidney disease. Limitations: Air/bowel gas. Performing Technologist: Charlane Ferretti RT (R)(VS)  Examination Guidelines: A complete evaluation includes B-mode imaging, spectral Doppler, color Doppler, and power Doppler as needed of all accessible portions of each vessel. Bilateral testing is considered an integral part of a complete examination. Limited examinations for reoccurring indications may be performed as noted.  Duplex Findings: +------------------+--------+--------+-------+ Right Renal ArteryPSV cm/sEDV cm/sComment +------------------+--------+--------+-------+ Origin              110                   +------------------+--------+--------+-------+ Proximal             58                    +------------------+--------+--------+-------+ Mid                  83      19           +------------------+--------+--------+-------+ Distal               98                   +------------------+--------+--------+-------+ +-----------------+--------+--------+-------+ Left Renal ArteryPSV cm/sEDV cm/sComment +-----------------+--------+--------+-------+ Origin             102                   +-----------------+--------+--------+-------+ Proximal           172      29           +-----------------+--------+--------+-------+ Mid                192                   +-----------------+--------+--------+-------+ Distal              17      4            +-----------------+--------+--------+-------+  +------------------+-----+------------------+-----+ Right Kidney           Left Kidney             +------------------+-----+------------------+-----+ RAR (manual)      1.66 RAR (manual)      3.31  +------------------+-----+------------------+-----+ Kidney length (cm)10.00Kidney length (cm)11.00 +------------------+-----+------------------+-----+  Summary: Renal:  Right: Normal size right kidney. No evidence of right renal artery        stenosis. Left:  Normal size of left kidney. 1-59% stenosis of the left renal        artery.         Bilateral free fluid visualized along the lateral borders.  *See table(s) above for measurements and observations.  Diagnosing physician: Leotis Pain MD  Electronically signed by Leotis Pain MD on 08/18/2018 at 10:38:06 AM.    Final     Assessment & Plan    Congestive heart failure-chest x-ray yesterday showed worsening bibasilar aeration consistent with pleural effusion.  Patient is had over 3 L since admission.  Will need to be careful with diuresis however given worsening renal function.  Appreciate nephrology assistance.  Will need their help with attempting to diurese.  Status post mechanical fall-head CT revealed chronic  white matter changes with no acute abnormality.  Renal insufficiency-creatinine markedly elevated.  Potassium improved.  GFR 11.  Will need to closely follow and dose medications based on renal function.  Cardiorespiratory arrest-likely secondary electrolyte abnormality.  No further sustained arrhythmias but the patient has had some nonsustained runs of wide-complex tachycardia which are asymptomatic.  Elevated troponin secondary to renal insufficiency and cardiac arrest.  Comorbid conditions would prevent any invasive evaluation at present.    Signed, Javier Docker Lynk Marti MD 08/28/2018, 8:22 AM  Pager: [phone]

## 2018-08-28 NOTE — Progress Notes (Signed)
   08/28/18 1300  Clinical Encounter Type  Visited With Patient  Visit Type Follow-up  Referral From Family  Spiritual Encounters  Spiritual Needs Prayer;Emotional  Stress Factors  Patient Stress Factors Health changes;Lack of caregivers;Loss of control;Major life changes  Ch received a call from Wife of the pt requesting a visit to the pt. Pt is in mental and emotional distress feeling isolated in the hospital. Pt has experienced lack of care this morning when he fell and had to wait for someone by himself. Pt finds it frustrating to reach out to the staff to receive care and feels like a burden to them. Pt benefits greatly from compassionate and caring interaction, which helps soothes his anxiety. Ch asked the nurse to install the call bell in the pt room so that pt can have an easier access to needed care. Ch also contacted the wife and daughter encouraging them to call the pt on a more frequent basis. Daughter requested additional anxiety medication. Ch will follow up this evening. Ch prayed with the pt and pt visibly seemed to calm right after it.

## 2018-08-28 NOTE — Progress Notes (Signed)
[place], [place] 08/28/18  Subjective:   78 year old Caucasian male with past medical history hypertension, obesity, lower extremity edema, chronic systolic heart failure with ejection fraction of 40%, obstructive sleep apnea, gout, and depression  K improved to 4.3 today However, renal function is worse Patient is non-oliguric, UOP 350 cc No acute SOB but requiring oxygen by nasal cannula States appetite is poor because he does not like the food [place] this morning.  Was found by nurse laying on his left side States he had difficulty sleeping last night Getting very anxious and wants to be discharged   Objective:  Vital signs in last 24 hours:  Temp:  [97.7 F (36.5 C)-98.9 F (37.2 C)] 97.8 F (36.6 C) (05/15 0730) Pulse Rate:  [87-103] 87 (05/15 0730) Resp:  [18-27] 18 (05/14 1659) BP: (136-155)/(74-99) 136/86 (05/15 0730) SpO2:  [92 %-100 %] 100 % (05/15 0730) Weight:  [92.9 kg] 92.9 kg (05/14 1659)  Weight change:  Filed Weights   08/25/18 0910 08/25/18 1234 08/27/18 1659  Weight: 86.2 kg 89.2 kg 92.9 kg    Intake/Output:    Intake/Output Summary (Last 24 hours) at 08/28/2018 1530 Last data filed at 08/28/2018 0200 Gross per 24 hour  Intake 58.51 ml  Output 350 ml  Net -291.49 ml     Physical Exam: General: Critically ill appearing  HEENT Moist oral mucus membranes, bandage on the head  Neck supple  Pulm/lungs Coarse breath sounds and mild at bases,  O2 [place]  CVS/Heart Regular, tachycardic  Abdomen:  Soft. NT  Extremities: + pitting edema  Neurologic: Alert, very anxious  Skin: No acute rashes  Access:    Foley,      Basic Metabolic Panel:  Recent Labs  Lab 08/25/18 0913 08/25/18 1040  08/26/18 0143 08/26/18 0431 08/27/18 0449 08/27/18 1418 08/28/18 0655  NA 140  --   --   --  142 137  --  137  K <2.0* <2.0*   < > 3.3* 3.4* 3.6 3.5 4.3  CL 91*  --   --   --  101 97*  --  100  CO2 21*  --   --   --  25 23  --   21*  GLUCOSE 310*  --   --   --  127* 91  --  113*  BUN 21  --   --   --  29* 40*  --  52*  CREATININE 1.70*  --   --   --  2.60* 3.75*  --  4.98*  CALCIUM 10.6*  --   --   --  8.7* 8.1*  --  8.1*  MG  --  0.8*  --  1.8  --  2.4  --   --    < > = values in this interval not displayed.     CBC: Recent Labs  Lab 08/25/18 0913 08/26/18 0431 08/27/18 0449 08/28/18 0655  WBC 14.8* 12.5* 10.4 9.2  NEUTROABS 8.7* 10.6*  --  7.4  HGB 10.8* 10.7* 10.1* 10.2*  HCT 34.8* 33.5* 31.7* 31.6*  MCV 99.1 95.4 95.2 92.9  PLT 312 319 230 251     No results found for: HEPBSAG, HEPBSAB, HEPBIGM    Microbiology:  Recent Results (from the past 240 hour(s))  SARS Coronavirus 2 (CEPHEID - Performed in [place] hospital lab), Hosp Order     Status: None   Collection Time: 08/25/18  9:13 AM  Result Value Ref Range Status  SARS Coronavirus 2 NEGATIVE NEGATIVE Final    Comment: (NOTE) If result is NEGATIVE SARS-CoV-2 target nucleic acids are NOT DETECTED. The SARS-CoV-2 RNA is generally detectable in upper and lower  respiratory specimens during the acute phase of infection. The lowest  concentration of SARS-CoV-2 viral copies this assay can detect is 250  copies / mL. A negative result does not preclude SARS-CoV-2 infection  and should not be used as the sole basis for treatment or other  patient management decisions.  A negative result may occur with  improper specimen collection / handling, submission of specimen other  than nasopharyngeal swab, presence of viral mutation(s) within the  areas targeted by this assay, and inadequate number of viral copies  (<250 copies / mL). A negative result must be combined with clinical  observations, patient history, and epidemiological information. If result is POSITIVE SARS-CoV-2 target nucleic acids are DETECTED. The SARS-CoV-2 RNA is generally detectable in upper and lower  respiratory specimens dur ing the acute phase of infection.  Positive   results are indicative of active infection with SARS-CoV-2.  Clinical  correlation with patient history and other diagnostic information is  necessary to determine patient infection status.  Positive results do  not rule out bacterial infection or co-infection with other viruses. If result is PRESUMPTIVE POSTIVE SARS-CoV-2 nucleic acids MAY BE PRESENT.   A presumptive positive result was obtained on the submitted specimen  and confirmed on repeat testing.  While 2019 novel coronavirus  (SARS-CoV-2) nucleic acids may be present in the submitted sample  additional confirmatory testing may be necessary for epidemiological  and / or clinical management purposes  to differentiate between  SARS-CoV-2 and other Sarbecovirus currently known to infect humans.  If clinically indicated additional testing with an alternate test  methodology [phone]) is advised. The SARS-CoV-2 RNA is generally  detectable in upper and lower respiratory sp ecimens during the acute  phase of infection. The expected result is Negative. Fact Sheet for Patients:  StrictlyIdeas.no Fact Sheet for Healthcare Providers: BankingDealers.co.za This test is not yet approved or cleared by the [place] FDA and has been authorized for detection and/or diagnosis of SARS-CoV-2 by FDA under an Emergency Use Authorization (EUA).  This EUA will remain in effect (meaning this test can be used) for the duration of the COVID-19 declaration under Section 564(b)(1) of the Act, 21 U.S.C. section 360bbb-3(b)(1), unless the authorization is terminated or revoked sooner. Performed at [facility], [address] [place], [place] [address]   MRSA PCR Screening     Status: Abnormal   Collection Time: 08/25/18 12:37 PM  Result Value Ref Range Status   MRSA by PCR (A) NEGATIVE Final    INVALID, UNABLE TO DETERMINE THE PRESENCE OF TARGET DNA DUE TO SPECIMEN INTEGRITY. RECOLLECTION  REQUESTED.    Comment: RESULT CALLED TO, READ BACK BY AND VERIFIED WITH: CALLED TO TAYLOR @1518  08/25/2018 [facility] Performed at [facility], [address] [place], [place] [address]   C difficile quick scan w PCR reflex     Status: None   Collection Time: 08/25/18  2:28 PM  Result Value Ref Range Status   C Diff antigen NEGATIVE NEGATIVE Final   C Diff toxin NEGATIVE NEGATIVE Final   C Diff interpretation No C. difficile detected.  Final    Comment: Performed at [facility], [address] [place], [place] [address]  Culture, Urine     Status: Abnormal   Collection Time: 08/26/18  2:51 AM  Result Value Ref Range Status   Specimen Description   Final    URINE, RANDOM Performed at [facility], [address] [place], [place] [address]    Special Requests   Final    Normal Performed at [facility], [address] [place], [place] [address]    Culture >=100,000 COLONIES/mL ESCHERICHIA COLI (A)  Final   Report Status 08/28/2018 FINAL  Final   Organism ID, Bacteria ESCHERICHIA COLI (A)  Final      Susceptibility   Escherichia coli - MIC*    AMPICILLIN <=2 SENSITIVE Sensitive     CEFAZOLIN <=4 SENSITIVE Sensitive     CEFTRIAXONE <=1 SENSITIVE Sensitive     CIPROFLOXACIN <=0.25 SENSITIVE Sensitive     GENTAMICIN <=1 SENSITIVE Sensitive     IMIPENEM <=0.25 SENSITIVE Sensitive     NITROFURANTOIN <=16 SENSITIVE Sensitive     TRIMETH/SULFA <=20 SENSITIVE Sensitive     AMPICILLIN/SULBACTAM <=2 SENSITIVE Sensitive     PIP/TAZO <=4 SENSITIVE Sensitive     Extended ESBL NEGATIVE Sensitive     * >=100,000 COLONIES/mL ESCHERICHIA COLI  Culture, respiratory (non-expectorated)     Status: None   Collection Time: 08/26/18  5:27 AM  Result Value Ref Range Status   Specimen Description   Final    TRACHEAL ASPIRATE Performed at [facility], [address] [place], [place] [address]    Special Requests   Final    Normal Performed at  [facility], [address] [place], [place] [address]    Gram Stain   Final    NO WBC SEEN RARE GRAM POSITIVE COCCI IN PAIRS RARE GRAM POSITIVE RODS    Culture   Final    MODERATE MORAXELLA CATARRHALIS(BRANHAMELLA) BETA LACTAMASE NEGATIVE Performed at [facility], [address], [place], [place] [address]    Report Status 08/28/2018 FINAL  Final  CULTURE, BLOOD (ROUTINE X 2) w Reflex to ID Panel     Status: None (Preliminary result)   Collection Time: 08/26/18  5:59 AM  Result Value Ref Range Status   Specimen Description BLOOD RIGHT HAND  Final   Special Requests   Final    BOTTLES DRAWN AEROBIC AND ANAEROBIC Blood Culture adequate volume   Culture   Final    NO GROWTH 2 DAYS Performed at [facility], [address], [place], [place] [address]    Report Status PENDING  Incomplete  CULTURE, BLOOD (ROUTINE X 2) w Reflex to ID Panel     Status: None (Preliminary result)   Collection Time: 08/26/18  6:11 AM  Result Value Ref Range Status   Specimen Description BLOOD BLOOD LEFT WRIST  Final   Special Requests   Final    BOTTLES DRAWN AEROBIC AND ANAEROBIC Blood Culture results may not be optimal due to an inadequate volume of blood received in culture bottles   Culture   Final    NO GROWTH 2 DAYS Performed at [facility], [address], [place], [place] [address]    Report Status PENDING  Incomplete  MRSA PCR Screening     Status: None   Collection Time: 08/26/18  9:31 AM  Result Value Ref Range Status   MRSA by PCR NEGATIVE NEGATIVE Final    Comment:        The GeneXpert MRSA Assay (FDA approved for NASAL specimens only), is one component of a comprehensive MRSA colonization surveillance program. It is not intended to diagnose MRSA infection nor  to guide or monitor treatment for MRSA infections. Performed at [facility], [address] [place], [place] [address]     Coagulation Studies: No results for input(s):  LABPROT, INR in the last 72 hours.  Urinalysis: Recent Labs    08/26/18 0251  COLORURINE YELLOW*  LABSPEC 1.013  PHURINE 5.0  GLUCOSEU NEGATIVE  HGBUR MODERATE*  BILIRUBINUR NEGATIVE  KETONESUR NEGATIVE  PROTEINUR 100*  NITRITE NEGATIVE  LEUKOCYTESUR MODERATE*      Imaging: Ct Head Wo Contrast  Result Date: 08/27/2018 CLINICAL DATA:  Recent cardiac arrest with fall and posterior laceration EXAM: CT HEAD WITHOUT CONTRAST TECHNIQUE: Contiguous axial images were obtained from the base of the skull through the vertex without intravenous contrast. COMPARISON:  08/25/2018 FINDINGS: Brain: Chronic atrophic and white matter ischemic changes are noted. No findings to suggest acute hemorrhage, acute infarction or space-occupying mass lesion are noted. Vascular: No hyperdense vessel or unexpected calcification. Skull: Normal. Negative for fracture or focal lesion. Sinuses/Orbits: No acute finding. Other: None. IMPRESSION: Chronic white matter ischemic change and atrophy without acute abnormality. Electronically Signed   By: Inez Catalina M.D.   On: 08/27/2018 13:47   Dg Chest Port 1 View  Result Date: 08/27/2018 CLINICAL DATA:  Respiratory failure. EXAM: PORTABLE CHEST 1 VIEW COMPARISON:  Radiograph yesterday. FINDINGS: Endotracheal and enteric tubes have been removed. Low lung volumes. Cardiomegaly is similar. Hazy opacity at both lung bases likely combination of pleural fluid and compressive atelectasis. Slight improvement in bilateral perihilar opacities. No visualized pneumothorax. IMPRESSION: Worsening bibasilar aeration with increasing hazy opacities likely combination of pleural effusion and compressive atelectasis. Slight improvement in bilateral perihilar opacities likely pulmonary edema. Electronically Signed   By: Keith Rake M.D.   On: 08/27/2018 02:53     Medications:   . sodium chloride Stopped (08/28/18 0148)   . amoxicillin-clavulanate  1 tablet Oral Q12H  . carvedilol   3.125 mg Oral BID WC  . citalopram  10 mg Oral Daily  . diphenhydrAMINE  25 mg Oral Once  . heparin  5,000 Units Subcutaneous Q8H  . mouth rinse  15 mL Mouth Rinse BID     Assessment/ Plan:  78 y.o. Caucasian male  with past medical history hypertension, obesity, lower extremity edema, chronic systolic heart failure with ejection fraction of 40%, obstructive sleep apnea, gout, and depression  1. Severe Hypokalemia 2. AKI on CKD st 3. Baseline Cr 1.50/GFR 45 on 08/17/2018 3. Hypomagnesemia 4. Acute resp failure, post outpatient cardiac arrest 5. LE Edema 6.  UTI, E. coli  Plan: AKI likely due to ATN, non oliguric Monitor UOP. Patient's respiratory status is becoming tenuous.  No IV fluid supplementation Continue careful monitoring of renal function Electrolytes and Volume status are acceptable No acute indication for Dialysis at present Patient's wife was updated by phone    LOS: [place] 5/15/20203:30 PM  [place], [place]  Note: This note was prepared with Dragon dictation. Any transcription errors are unintentional

## 2018-08-28 NOTE — Progress Notes (Signed)
[place] [facility] [place] NAME: Kenon Delashmit    MR#:  [id number]  DATE OF BIRTH:  1940/06/21  SUBJECTIVE:    Has some coughing. No new complaints REVIEW OF SYSTEMS:   Review of Systems  Constitutional: Negative for chills, fever and weight loss.  HENT: Negative for ear discharge, ear pain and nosebleeds.   Eyes: Negative for blurred vision, pain and discharge.  Respiratory: Positive for cough and shortness of breath. Negative for sputum production, wheezing and stridor.   Cardiovascular: Positive for orthopnea. Negative for chest pain, palpitations and PND.  Gastrointestinal: Negative for abdominal pain, diarrhea, nausea and vomiting.  Genitourinary: Negative for frequency and urgency.  Musculoskeletal: Negative for back pain and joint pain.  Neurological: Negative for sensory change, speech change, focal weakness and weakness.  Psychiatric/Behavioral: Negative for depression and hallucinations. The patient is not nervous/anxious.    Tolerating Diet:yes Tolerating PT: pending  DRUG ALLERGIES:  No Known Allergies  VITALS:  Blood pressure 136/86, pulse 87, temperature 97.8 F (36.6 C), temperature source Oral, resp. rate 18, height 5\' 7"  (1.702 m), weight 92.9 kg, SpO2 100 %.  PHYSICAL EXAMINATION:   Physical Exam  GENERAL:  78 y.o.-year-old patient lying in the bed with no acute distress.  EYES: Pupils equal, round, reactive to light and accommodation. No scleral icterus. Extraocular muscles intact.  HEENT: Head atraumatic, normocephalic. Oropharynx and nasopharynx clear.  NECK:  Supple, no jugular venous distention. No thyroid enlargement, no tenderness.  LUNGS:decreased breath sounds bilaterally, no wheezing, rales, rhonchi. No use of accessory muscles of respiration.  CARDIOVASCULAR: S1, S2 normal. No murmurs, rubs, or gallops.  ABDOMEN: Soft, nontender, nondistended. Bowel sounds present. No organomegaly or mass.  EXTREMITIES:  No cyanosis, clubbing or edema b/l.    NEUROLOGIC: Cranial nerves II through XII are intact. No focal Motor or sensory deficits b/l.   PSYCHIATRIC:  patient is alert and oriented x 2.  SKIN: No obvious rash, lesion, or ulcer.   LABORATORY PANEL:  CBC Recent Labs  Lab 08/28/18 0655  WBC 9.2  HGB 10.2*  HCT 31.6*  PLT 251    Chemistries  Recent Labs  Lab 08/27/18 0449  08/28/18 0655  NA 137  --  137  K 3.6   < > 4.3  CL 97*  --  100  CO2 23  --  21*  GLUCOSE 91  --  113*  BUN 40*  --  52*  CREATININE 3.75*  --  4.98*  CALCIUM 8.1*  --  8.1*  MG 2.4  --   --   AST 925*  --  354*  ALT 973*  --  708*  ALKPHOS 65  --  71  BILITOT 0.6  --  0.7   < > = values in this interval not displayed.   Cardiac Enzymes Recent Labs  Lab 08/28/18 0655  TROPONINI 2.74*   RADIOLOGY:  Ct Head Wo Contrast  Result Date: 08/27/2018 CLINICAL DATA:  Recent cardiac arrest with fall and posterior laceration EXAM: CT HEAD WITHOUT CONTRAST TECHNIQUE: Contiguous axial images were obtained from the base of the skull through the vertex without intravenous contrast. COMPARISON:  08/25/2018 FINDINGS: Brain: Chronic atrophic and white matter ischemic changes are noted. No findings to suggest acute hemorrhage, acute infarction or space-occupying mass lesion are noted. Vascular: No hyperdense vessel or unexpected calcification. Skull: Normal. Negative for fracture or focal lesion. Sinuses/Orbits: No acute finding. Other: None. IMPRESSION: Chronic white matter ischemic change and  atrophy without acute abnormality. Electronically Signed   By: Inez Catalina M.D.   On: 08/27/2018 13:47   Dg Chest Port 1 View  Result Date: 08/27/2018 CLINICAL DATA:  Respiratory failure. EXAM: PORTABLE CHEST 1 VIEW COMPARISON:  Radiograph yesterday. FINDINGS: Endotracheal and enteric tubes have been removed. Low lung volumes. Cardiomegaly is similar. Hazy opacity at both lung bases likely combination of pleural fluid and  compressive atelectasis. Slight improvement in bilateral perihilar opacities. No visualized pneumothorax. IMPRESSION: Worsening bibasilar aeration with increasing hazy opacities likely combination of pleural effusion and compressive atelectasis. Slight improvement in bilateral perihilar opacities likely pulmonary edema. Electronically Signed   By: Keith Rake M.D.   On: 08/27/2018 02:53   ASSESSMENT AND PLAN:  Devyn Sheerin  is a 78 y.o. male with a known history of congestive heart failure systolic, chronic kidney disease stage III, hypertension, gout comes to the emergency room after he had a cardiac arrest at [employer] while shopping. According to the wife and ER records patient was having a routine day and while at checkout per wife he all of a sudden collapsed.   1. Cardiorespiratory arrest. Patient now off ventilator -suspected due to severe electrolyte abnormality  2. Severe electrolyte abnormality with hypokalemia and severe hypomagnesimia -patient currently getting IV potassium and IV magnesium--repeleted -patient recently had hyperkalemia came to the emergency room was taken of his potassium and lokelma was started -now getting po daily KCL  3. Chronic kidney disease stage IV with ATN  Due to cardiorespiratory arrest  -baseline creatinine around 1.5--2.6--3.75--4.98 -spoke with Dr Candiss Norse -Received IV lasix 40 x 1 (2 days ago) -avaoid nephrotoxins. Avoiding IVF due to volume overload  4. Elevated troponin in the setting of cardiac arrest and ATN -no history of CAD per family -Dr. Bethanne Ginger input appreciated--no further cardiac diagnostics at present  5.elevated transaminases -appears shock liver with Cardiac arrest--levels trending down  6. DVT prophylaxis subcu heparin  7/patient has history of cardiomyopathy with EF of 30 to 35% -x-ray questionable pulmonary edema versus aspiration , elevated procalcitonin and E coli UTI  -empirically on Zosyn--now changed to po  augmentin   CODE STATUS: full  DVT Prophylaxis: heparin TOTAL TIME TAKING CARE OF THIS PATIENT: *30* minutes.  >50% time spent on counselling and coordination of care  POSSIBLE D/C IN ? DAYS, DEPENDING ON CLINICAL CONDITION.  Note: This dictation was prepared with Dragon dictation along with smaller phrase technology. Any transcriptional errors that result from this process are unintentional.  Fritzi Mandes M.D on 08/28/2018 at 3:29 PM  Between 7am to 6pm - Pager - [phone]  After 6pm go to [email or web address] - password [email or web address] [facility]  Office  [phone]  CC: Primary care physician; Valera Castle, MDPatient ID: Jason Fila, male   DOB: [birth year], 78 y.o.   MRN: [id number]

## 2018-08-28 NOTE — Progress Notes (Signed)
Patient with bed alarm sounding.  Patient found lying in floor on his left side with legs extended.  Yelling for help when staff entered room.  States "I need to go to the bathroom".  Patient incontinent of dark brown stool.  Patient alert but confused to time, place, and situation.  Unable to recall why he was in hospital.  Able to move all extremities without pain, denies hitting head.  Denies pain to head, back legs and hips.  Pt returned to bed via lift.  No new breaks in skin or redness noted.  Chest remains tender to palpation.   ST noted on telemetry.  Call made to CCMD to request if any changes on telemetry noted.  Informed at [phone] of two 3 beat runs of VT at 0602 and 0640.  Dr Marcille Blanco made aware at [phone].  No further orders.  Pt's wife Inez Catalina notified at [phone].   08/28/18 0636  What Happened  Was fall witnessed? No  Was patient injured? No  Patient found on floor  Found by Staff-comment Sabra Heck)  Stated prior activity bathroom-unassisted  Follow Up  MD notified Dr Marcille Blanco  Time MD notified [phone]  Family notified Yes-comment (wife Inez Catalina)  Time family notified [phone]  Additional tests No  Simple treatment  (none)  Vitals  Temp 97.7 F (36.5 C)  Temp Source Oral  BP (!) 144/93  MAP (mmHg) 109  BP Method Automatic  Pulse Rate 96  Pulse Rate Source Monitor  Oxygen Therapy  SpO2 96 %

## 2018-08-28 NOTE — Progress Notes (Signed)
PHARMACY NOTE:  ANTIMICROBIAL RENAL DOSAGE ADJUSTMENT  Current antimicrobial regimen includes a mismatch between antimicrobial dosage and estimated renal function.  As per policy approved by the Pharmacy & Therapeutics and Medical Executive Committees, the antimicrobial dosage will be adjusted accordingly.  Current antimicrobial dosage:  Augmentin 875-125 mg PO BID  Indication: Asp PNA/UTI  Renal Function:  Estimated Creatinine Clearance: 13.9 mL/min (A) (by C-G formula based on SCr of 4.98 mg/dL (H)). []      On intermittent HD, scheduled: []      On CRRT    Antimicrobial dosage has been changed to:  Augmentin 500-125 mg PO BID  Additional comments:   Thank you for allowing pharmacy to be a part of this patient's care.  Rocky Morel, [facility] 08/28/2018 10:10 AM

## 2018-08-28 NOTE — Care Management Important Message (Signed)
Important Message  Patient Details  Name: Gary Harmon MRN: [id number] Date of Birth: 08-Feb-1941   Medicare Important Message Given:  Yes    Dannette Barbara 08/28/2018, 2:51 PM

## 2018-08-28 NOTE — Progress Notes (Signed)
   08/28/18 2000  Clinical Encounter Type  Visited With Patient  Visit Type Follow-up  Spiritual Encounters  Spiritual Needs Prayer;Emotional  Ch followed up with the pt. Pt was much calmer and positive. Ch asked what is the pt's source of strength and pt talked about his faith, and how he sees himself in the role of the bible figure Job who went through many trials and came out stronger. Pt depicted his situation as walking in the valleys trying to reach the top of the mountains. Pt talked about recent illnesses and how they have set him back from reaching the mountain top. Ch made space for pt to pray out his wishes, concerns, and thanks. Frequent calls from wife and medicinal intervention seem to be helping greatly as well as talking about his faith. Pt said that talking about Jesus makes him stronger.

## 2018-08-28 NOTE — Plan of Care (Signed)
  Problem: Education: Goal: Knowledge of General Education information will improve Description Including pain rating scale, medication(s)/side effects and non-pharmacologic comfort measures Outcome: Not Progressing Note:  Patient continues to be acutely confused. Fell overnight. Low bed w/ floor mats in place. Patient also w/ almost constant diarrhea. Imodium PRN ordered and given. Will continue to monitor. Wenda Low [facility]

## 2018-08-29 LAB — RENAL FUNCTION PANEL
Albumin: 2.7 g/dL — ABNORMAL LOW (ref 3.5–5.0)
Anion gap: 11 (ref 5–15)
BUN: 58 mg/dL — ABNORMAL HIGH (ref 8–23)
CO2: 24 mmol/L (ref 22–32)
Calcium: 8.4 mg/dL — ABNORMAL LOW (ref 8.9–10.3)
Chloride: 105 mmol/L (ref 98–111)
Creatinine, Ser: 5.58 mg/dL — ABNORMAL HIGH (ref 0.61–1.24)
GFR calc Af Amer: 11 mL/min — ABNORMAL LOW (ref >60)
GFR calc non Af Amer: 9 mL/min — ABNORMAL LOW (ref >60)
Glucose, Bld: 99 mg/dL (ref 70–99)
Phosphorus: 3.7 mg/dL (ref 2.5–4.6)
Potassium: 4.2 mmol/L (ref 3.5–5.1)
Sodium: 140 mmol/L (ref 135–145)

## 2018-08-29 NOTE — Progress Notes (Signed)
[place] [facility] [place] NAME: Gary Harmon    MR#:  [id number]  DATE OF BIRTH:  December 24, 1940  SUBJECTIVE:   Pt. overall feels much better.  Creatinine is rising but patient's urine output is stable.  No other acute events overnight.   REVIEW OF SYSTEMS:   Review of Systems  Constitutional: Negative for chills, fever and weight loss.  HENT: Negative for ear discharge, ear pain and nosebleeds.   Eyes: Negative for blurred vision, pain and discharge.  Respiratory: Positive for shortness of breath (exertion). Negative for cough, sputum production, wheezing and stridor.   Cardiovascular: Negative for chest pain, palpitations, orthopnea and PND.  Gastrointestinal: Negative for abdominal pain, diarrhea, nausea and vomiting.  Genitourinary: Negative for frequency and urgency.  Musculoskeletal: Negative for back pain and joint pain.  Neurological: Positive for weakness (generalized). Negative for sensory change, speech change and focal weakness.  Psychiatric/Behavioral: Negative for depression and hallucinations. The patient is not nervous/anxious.    Tolerating Diet:yes Tolerating PT: pending  DRUG ALLERGIES:  No Known Allergies  VITALS:  Blood pressure (!) 126/98, pulse 95, temperature (!) 97.5 F (36.4 C), temperature source Oral, resp. rate 18, height 5\' 7"  (1.702 m), weight 92.9 kg, SpO2 96 %.  PHYSICAL EXAMINATION:   Physical Exam  GENERAL:  78 y.o.-year-old patient lying in the bed in no acute distress.  EYES: Pupils equal, round, reactive to light and accommodation. No scleral icterus. Extraocular muscles intact.  HEENT: Head atraumatic, normocephalic. Oropharynx and nasopharynx clear.  NECK:  Supple, no jugular venous distention. No thyroid enlargement, no tenderness.  LUNGS:decreased breath sounds bilaterally, no wheezing, rales, rhonchi. No use of accessory muscles of respiration.  CARDIOVASCULAR: S1, S2 normal. No murmurs,  rubs, or gallops.  ABDOMEN: Soft, nontender, nondistended. Bowel sounds present. No organomegaly or mass.  EXTREMITIES: No cyanosis, clubbing or edema b/l.    NEUROLOGIC: Cranial nerves II through XII are intact. No focal Motor or sensory deficits b/l. Globally weak  PSYCHIATRIC:  patient is alert and oriented x 3.  SKIN: No obvious rash, lesion, or ulcer.   LABORATORY PANEL:  CBC Recent Labs  Lab 08/28/18 0655  WBC 9.2  HGB 10.2*  HCT 31.6*  PLT 251    Chemistries  Recent Labs  Lab 08/27/18 0449  08/28/18 0655 08/29/18 0929  NA 137  --  137 140  K 3.6   < > 4.3 4.2  CL 97*  --  100 105  CO2 23  --  21* 24  GLUCOSE 91  --  113* 99  BUN 40*  --  52* 58*  CREATININE 3.75*  --  4.98* 5.58*  CALCIUM 8.1*  --  8.1* 8.4*  MG 2.4  --   --   --   AST 925*  --  354*  --   ALT 973*  --  708*  --   ALKPHOS 65  --  71  --   BILITOT 0.6  --  0.7  --    < > = values in this interval not displayed.   Cardiac Enzymes Recent Labs  Lab 08/28/18 0655  TROPONINI 2.74*   RADIOLOGY:  No results found. ASSESSMENT AND PLAN:  Gary Harmon  is a 78 y.o. male with a known history of congestive heart failure systolic, chronic kidney disease stage III, hypertension, gout comes to the emergency room after he had a cardiac arrest at [employer] while shopping. According to the wife and ER records  patient was having a routine day and while at checkout per wife he all of a sudden collapsed.   1. Cardiorespiratory arrest. - much improved and now extubated.  - suspected due to severe electrolyte abnormality.   2. Severe electrolyte abnormality with hypokalemia and severe hypomagnesimia -Improved with supplementation and currently normal. - Patient was on Lokelma as an outpatient due to worsening hyperkalemia due to Aldactone.  Now much improved.  We will continue to monitor serial electrolytes.  3. Acute on Chronic kidney disease stage IV with ATN  Due to cardiorespiratory arrest  -baseline  creatinine around 1.5--2.6--3.75--4.98--5.5 today - Urine output is fairly good and no acute indication for hemodialysis. -Continue to follow BUN/creatinine and urine output.  Renal dose meds, avoid nephrotoxins.  4. Elevated troponin in the setting of cardiac arrest and ATN -no history of CAD per family -Dr. Bethanne Ginger input appreciated--no further cardiac diagnostics at present  5.elevated transaminases -appears shock liver with Cardiac arrest--levels trending down  6. UTI/Aspiration pneumonia - cont. Augmentin.  - urine culture + for 100,000 colonies of E. coli which is pansensitive. - Respiratory culture from 3 days ago currently remains negative.  CODE STATUS: full  DVT Prophylaxis: heparin  TOTAL TIME TAKING CARE OF THIS PATIENT: *30* minutes.   POSSIBLE D/C IN ? DAYS, DEPENDING ON CLINICAL CONDITION.  Note: This dictation was prepared with Dragon dictation along with smaller phrase technology. Any transcriptional errors that result from this process are unintentional.  [name] on 08/29/2018 at 3:16 PM  Between 7am to 6pm - Pager - [phone]  After 6pm go to [email or web address] - password [email or web address] [facility]  Office  [phone]  CC: Primary care physician; Valera Castle, MDPatient ID: Gary Harmon, male   DOB: Mar 06, 1941, 78 y.o.   MRN: [id number]

## 2018-08-29 NOTE — Progress Notes (Signed)
[place], [place] 08/29/18  Subjective:   78 year old Caucasian male with past medical history hypertension, obesity, lower extremity edema, chronic systolic heart failure with ejection fraction of 40%, obstructive sleep apnea, gout, and depression  Potassium is stable at 4.2 However, renal function is worse, creatinine has increased to 5.6 Urine output is not accurately recorded No acute SOB but requiring oxygen by nasal cannula States appetite is poor because he does not like the food More alert today States he was able to sleep well last night.      Objective:  Vital signs in last 24 hours:  Temp:  [97.5 F (36.4 C)-98.6 F (37 C)] 97.5 F (36.4 C) (05/16 0741) Pulse Rate:  [86-97] 95 (05/16 0741) Resp:  [18-24] 18 (05/16 0421) BP: (115-135)/(61-98) 126/98 (05/16 0741) SpO2:  [96 %-100 %] 96 % (05/16 0741)  Weight change:  Filed Weights   08/25/18 0910 08/25/18 1234 08/27/18 1659  Weight: 86.2 kg 89.2 kg 92.9 kg    Intake/Output:    Intake/Output Summary (Last 24 hours) at 08/29/2018 1353 Last data filed at 08/29/2018 1100 Gross per 24 hour  Intake -  Output 426 ml  Net -426 ml     Physical Exam: General: Critically ill appearing  HEENT Moist oral mucus membranes, bandage on the head  Neck supple  Pulm/lungs Coarse breath sounds and mild at bases,  O2 [place]  CVS/Heart Regular, tachycardic  Abdomen:  Soft. NT  Extremities: + pitting edema  Neurologic: Alert, able to follow commands appropriately  Skin: No acute rashes          Basic Metabolic Panel:  Recent Labs  Lab 08/25/18 0913 08/25/18 1040  08/26/18 0143 08/26/18 0431 08/27/18 0449 08/27/18 1418 08/28/18 0655 08/29/18 0929  NA 140  --   --   --  142 137  --  137 140  K <2.0* <2.0*   < > 3.3* 3.4* 3.6 3.5 4.3 4.2  CL 91*  --   --   --  101 97*  --  100 105  CO2 21*  --   --   --  25 23  --  21* 24  GLUCOSE 310*  --   --   --  127* 91  --  113* 99  BUN 21  --    --   --  29* 40*  --  52* 58*  CREATININE 1.70*  --   --   --  2.60* 3.75*  --  4.98* 5.58*  CALCIUM 10.6*  --   --   --  8.7* 8.1*  --  8.1* 8.4*  MG  --  0.8*  --  1.8  --  2.4  --   --   --   PHOS  --   --   --   --   --   --   --   --  3.7   < > = values in this interval not displayed.     CBC: Recent Labs  Lab 08/25/18 0913 08/26/18 0431 08/27/18 0449 08/28/18 0655  WBC 14.8* 12.5* 10.4 9.2  NEUTROABS 8.7* 10.6*  --  7.4  HGB 10.8* 10.7* 10.1* 10.2*  HCT 34.8* 33.5* 31.7* 31.6*  MCV 99.1 95.4 95.2 92.9  PLT 312 319 230 251     No results found for: HEPBSAG, HEPBSAB, HEPBIGM    Microbiology:  Recent Results (from the past 240 hour(s))  SARS Coronavirus 2 (CEPHEID - Performed in [employer] [facility] hospital  lab), Hosp Order     Status: None   Collection Time: 08/25/18  9:13 AM  Result Value Ref Range Status   SARS Coronavirus 2 NEGATIVE NEGATIVE Final    Comment: (NOTE) If result is NEGATIVE SARS-CoV-2 target nucleic acids are NOT DETECTED. The SARS-CoV-2 RNA is generally detectable in upper and lower  respiratory specimens during the acute phase of infection. The lowest  concentration of SARS-CoV-2 viral copies this assay can detect is 250  copies / mL. A negative result does not preclude SARS-CoV-2 infection  and should not be used as the sole basis for treatment or other  patient management decisions.  A negative result may occur with  improper specimen collection / handling, submission of specimen other  than nasopharyngeal swab, presence of viral mutation(s) within the  areas targeted by this assay, and inadequate number of viral copies  (<250 copies / mL). A negative result must be combined with clinical  observations, patient history, and epidemiological information. If result is POSITIVE SARS-CoV-2 target nucleic acids are DETECTED. The SARS-CoV-2 RNA is generally detectable in upper and lower  respiratory specimens dur ing the acute phase of infection.   Positive  results are indicative of active infection with SARS-CoV-2.  Clinical  correlation with patient history and other diagnostic information is  necessary to determine patient infection status.  Positive results do  not rule out bacterial infection or co-infection with other viruses. If result is PRESUMPTIVE POSTIVE SARS-CoV-2 nucleic acids MAY BE PRESENT.   A presumptive positive result was obtained on the submitted specimen  and confirmed on repeat testing.  While 2019 novel coronavirus  (SARS-CoV-2) nucleic acids may be present in the submitted sample  additional confirmatory testing may be necessary for epidemiological  and / or clinical management purposes  to differentiate between  SARS-CoV-2 and other Sarbecovirus currently known to infect humans.  If clinically indicated additional testing with an alternate test  methodology [id number]) is advised. The SARS-CoV-2 RNA is generally  detectable in upper and lower respiratory sp ecimens during the acute  phase of infection. The expected result is Negative. Fact Sheet for Patients:  StrictlyIdeas.no Fact Sheet for Healthcare Providers: BankingDealers.co.za This test is not yet approved or cleared by the [place] FDA and has been authorized for detection and/or diagnosis of SARS-CoV-2 by FDA under an Emergency Use Authorization (EUA).  This EUA will remain in effect (meaning this test can be used) for the duration of the COVID-19 declaration under Section 564(b)(1) of the Act, 21 U.S.C. section 360bbb-3(b)(1), unless the authorization is terminated or revoked sooner. Performed at [facility], [address] [place], [place] [address]   MRSA PCR Screening     Status: Abnormal   Collection Time: 08/25/18 12:37 PM  Result Value Ref Range Status   MRSA by PCR (A) NEGATIVE Final    INVALID, UNABLE TO DETERMINE THE PRESENCE OF TARGET DNA DUE TO SPECIMEN INTEGRITY.  RECOLLECTION REQUESTED.    Comment: RESULT CALLED TO, READ BACK BY AND VERIFIED WITH: CALLED TO TAYLOR @1518  08/25/2018 [facility] Performed at [facility], [address] [place], [place] [address]   C difficile quick scan w PCR reflex     Status: None   Collection Time: 08/25/18  2:28 PM  Result Value Ref Range Status   C Diff antigen NEGATIVE NEGATIVE Final   C Diff toxin NEGATIVE NEGATIVE Final   C Diff interpretation No C. difficile detected.  Final    Comment: Performed at [facility], [address] [place], [place] [address]  Culture, Urine     Status: Abnormal   Collection Time: 08/26/18  2:51 AM  Result Value Ref Range Status   Specimen Description   Final    URINE, RANDOM Performed at [facility], [address] [place], [place] [address]    Special Requests   Final    Normal Performed at [facility], [address] [place], [place] [address]    Culture >=100,000 COLONIES/mL ESCHERICHIA COLI (A)  Final   Report Status 08/28/2018 FINAL  Final   Organism ID, Bacteria ESCHERICHIA COLI (A)  Final      Susceptibility   Escherichia coli - MIC*    AMPICILLIN <=2 SENSITIVE Sensitive     CEFAZOLIN <=4 SENSITIVE Sensitive     CEFTRIAXONE <=1 SENSITIVE Sensitive     CIPROFLOXACIN <=0.25 SENSITIVE Sensitive     GENTAMICIN <=1 SENSITIVE Sensitive     IMIPENEM <=0.25 SENSITIVE Sensitive     NITROFURANTOIN <=16 SENSITIVE Sensitive     TRIMETH/SULFA <=20 SENSITIVE Sensitive     AMPICILLIN/SULBACTAM <=2 SENSITIVE Sensitive     PIP/TAZO <=4 SENSITIVE Sensitive     Extended ESBL NEGATIVE Sensitive     * >=100,000 COLONIES/mL ESCHERICHIA COLI  Culture, respiratory (non-expectorated)     Status: None   Collection Time: 08/26/18  5:27 AM  Result Value Ref Range Status   Specimen Description   Final    TRACHEAL ASPIRATE Performed at [facility], [address], [place], [place] [address]    Special Requests   Final     Normal Performed at [facility], [address] [place], [place] [address]    Gram Stain   Final    NO WBC SEEN RARE GRAM POSITIVE COCCI IN PAIRS RARE GRAM POSITIVE RODS    Culture   Final    MODERATE MORAXELLA CATARRHALIS(BRANHAMELLA) BETA LACTAMASE NEGATIVE Performed at [facility], [address], [place], [place] [address]    Report Status 08/28/2018 FINAL  Final  CULTURE, BLOOD (ROUTINE X 2) w Reflex to ID Panel     Status: None (Preliminary result)   Collection Time: 08/26/18  5:59 AM  Result Value Ref Range Status   Specimen Description BLOOD RIGHT HAND  Final   Special Requests   Final    BOTTLES DRAWN AEROBIC AND ANAEROBIC Blood Culture adequate volume   Culture   Final    NO GROWTH 3 DAYS Performed at [facility], [address], [place], [place] [address]    Report Status PENDING  Incomplete  CULTURE, BLOOD (ROUTINE X 2) w Reflex to ID Panel     Status: None (Preliminary result)   Collection Time: 08/26/18  6:11 AM  Result Value Ref Range Status   Specimen Description BLOOD BLOOD LEFT WRIST  Final   Special Requests   Final    BOTTLES DRAWN AEROBIC AND ANAEROBIC Blood Culture results may not be optimal due to an inadequate volume of blood received in culture bottles   Culture   Final    NO GROWTH 3 DAYS Performed at [facility], [address] [place],  [address]    Report Status PENDING  Incomplete  MRSA PCR Screening     Status: None   Collection Time: 08/26/18  9:31 AM  Result Value Ref Range Status   MRSA by PCR NEGATIVE NEGATIVE Final    Comment:        The GeneXpert MRSA  Assay (FDA approved for NASAL specimens only), is one component of a comprehensive MRSA colonization surveillance program. It is not intended to diagnose MRSA infection nor to guide or monitor treatment for MRSA infections. Performed at [facility], [address] [place], [place] [address]     Coagulation Studies: No  results for input(s): LABPROT, INR in the last 72 hours.  Urinalysis: No results for input(s): COLORURINE, LABSPEC, PHURINE, GLUCOSEU, HGBUR, BILIRUBINUR, KETONESUR, PROTEINUR, UROBILINOGEN, NITRITE, LEUKOCYTESUR in the last 72 hours.  Invalid input(s): APPERANCEUR    Imaging: No results found.   Medications:   . sodium chloride Stopped (08/28/18 0148)   . amoxicillin-clavulanate  1 tablet Oral Q12H  . carvedilol  3.125 mg Oral BID WC  . citalopram  10 mg Oral Daily  . heparin  5,000 Units Subcutaneous Q8H  . mouth rinse  15 mL Mouth Rinse BID     Assessment/ Plan:  78 y.o. Caucasian male  with past medical history hypertension, obesity, lower extremity edema, chronic systolic heart failure with ejection fraction of 40%, obstructive sleep apnea, gout, and depression  1. Severe Hypokalemia 2. AKI on CKD st 3. Baseline Cr 1.50/GFR 45 on 08/17/2018 3. Hypomagnesemia 4. Acute resp failure, post outpatient cardiac arrest 5. LE Edema 6.  UTI, E. coli  Plan: AKI likely due to ATN,  Monitor UOP closely Patient's respiratory status is borderline.  No IV fluid supplementation Continue careful monitoring of renal function Electrolytes and Volume status are acceptable No acute indication for Dialysis at present Daily labs/electrolytes    LOS: [place] 5/16/20201:53 PM  [place], [place]  Note: This note was prepared with Dragon dictation. Any transcription errors are unintentional

## 2018-08-30 LAB — RENAL FUNCTION PANEL
Albumin: 2.8 g/dL — ABNORMAL LOW (ref 3.5–5.0)
Anion gap: 12 (ref 5–15)
BUN: 60 mg/dL — ABNORMAL HIGH (ref 8–23)
CO2: 26 mmol/L (ref 22–32)
Calcium: 8.4 mg/dL — ABNORMAL LOW (ref 8.9–10.3)
Chloride: 103 mmol/L (ref 98–111)
Creatinine, Ser: 5.52 mg/dL — ABNORMAL HIGH (ref 0.61–1.24)
GFR calc Af Amer: 11 mL/min — ABNORMAL LOW
GFR calc non Af Amer: 9 mL/min — ABNORMAL LOW
Glucose, Bld: 104 mg/dL — ABNORMAL HIGH (ref 70–99)
Phosphorus: 3.7 mg/dL (ref 2.5–4.6)
Potassium: 4 mmol/L (ref 3.5–5.1)
Sodium: 141 mmol/L (ref 135–145)

## 2018-08-30 LAB — GLUCOSE, CAPILLARY: Glucose-Capillary: 125 mg/dL — ABNORMAL HIGH (ref 70–99)

## 2018-08-30 NOTE — Progress Notes (Signed)
[place] at [place] NAME: Gary Harmon    MR#:  [id number]  DATE OF BIRTH:  1940/12/03  SUBJECTIVE:   No acute events overnight, creatinine stable.  Patient feels overall much better denies any acute complaints.  REVIEW OF SYSTEMS:   Review of Systems  Constitutional: Negative for chills, fever and weight loss.  HENT: Negative for ear discharge, ear pain and nosebleeds.   Eyes: Negative for blurred vision, pain and discharge.  Respiratory: Positive for shortness of breath (exertion). Negative for cough, sputum production, wheezing and stridor.   Cardiovascular: Negative for chest pain, palpitations, orthopnea and PND.  Gastrointestinal: Negative for abdominal pain, diarrhea, nausea and vomiting.  Genitourinary: Negative for frequency and urgency.  Musculoskeletal: Negative for back pain and joint pain.  Neurological: Positive for weakness (generalized). Negative for sensory change, speech change and focal weakness.  Psychiatric/Behavioral: Negative for depression and hallucinations. The patient is not nervous/anxious.    Tolerating Diet: yes Tolerating PT: Eval noted.   DRUG ALLERGIES:  No Known Allergies  VITALS:  Blood pressure 117/81, pulse 83, temperature 98.7 F (37.1 C), temperature source Oral, resp. rate 16, height 5\' 7"  (1.702 m), weight 92.9 kg, SpO2 100 %.  PHYSICAL EXAMINATION:   Physical Exam  GENERAL:  78 y.o.-year-old patient sitting up in chair in no acute distress.  EYES: Pupils equal, round, reactive to light and accommodation. No scleral icterus. Extraocular muscles intact.  HEENT: Head atraumatic, normocephalic. Oropharynx and nasopharynx clear.  NECK:  Supple, no jugular venous distention. No thyroid enlargement, no tenderness.  LUNGS:decreased breath sounds bilaterally, no wheezing, rales, rhonchi. No use of accessory muscles of respiration.  CARDIOVASCULAR: S1, S2 normal. No murmurs, rubs, or gallops.   ABDOMEN: Soft, nontender, nondistended. Bowel sounds present. No organomegaly or mass.  EXTREMITIES: No cyanosis, clubbing or edema b/l.    NEUROLOGIC: Cranial nerves II through XII are intact. No focal Motor or sensory deficits b/l. Globally weak  PSYCHIATRIC:  patient is alert and oriented x 3.  SKIN: No obvious rash, lesion, or ulcer.   LABORATORY PANEL:  CBC Recent Labs  Lab 08/28/18 0655  WBC 9.2  HGB 10.2*  HCT 31.6*  PLT 251    Chemistries  Recent Labs  Lab 08/27/18 0449  08/28/18 0655  08/30/18 0450  NA 137  --  137   < > 141  K 3.6   < > 4.3   < > 4.0  CL 97*  --  100   < > 103  CO2 23  --  21*   < > 26  GLUCOSE 91  --  113*   < > 104*  BUN 40*  --  52*   < > 60*  CREATININE 3.75*  --  4.98*   < > 5.52*  CALCIUM 8.1*  --  8.1*   < > 8.4*  MG 2.4  --   --   --   --   AST 925*  --  354*  --   --   ALT 973*  --  708*  --   --   ALKPHOS 65  --  71  --   --   BILITOT 0.6  --  0.7  --   --    < > = values in this interval not displayed.   Cardiac Enzymes Recent Labs  Lab 08/28/18 0655  TROPONINI 2.74*   RADIOLOGY:  No results found. ASSESSMENT AND PLAN:  Gary Harmon  is a 78 y.o. male with a known history of congestive heart failure systolic, chronic kidney disease stage III, hypertension, gout comes to the emergency room after he had a cardiac arrest at [employer] while shopping. According to the wife and ER records patient was having a routine day and while at checkout per wife he all of a sudden collapsed.   1. Cardiorespiratory arrest. - much improved and now extubated.  - suspected due to severe electrolyte abnormality and currently stable.   2. Severe electrolyte abnormality with hypokalemia and severe hypomagnesimia - Improved with supplementation and currently normal. - Patient was on Lokelma as an outpatient due to worsening hyperkalemia due to Aldactone.  Now much improved.  We will continue to monitor serial electrolytes.  3. Acute on Chronic  kidney disease stage IV with ATN  Due to cardiorespiratory arrest  -baseline creatinine around 1.5--2.6--3.75--4.98--5.5--5.5 again today - Urine output is fairly good and no acute indication for hemodialysis. -Continue to follow BUN/creatinine and urine output.  Renal dose meds, avoid nephrotoxins.  4. Elevated troponin in the setting of cardiac arrest and ATN -no history of CAD per family -Dr. Bethanne Ginger input appreciated--no further cardiac diagnostics at present  5.elevated transaminases -appears shock liver with Cardiac arrest--levels trending down and normalized now  6. UTI/Aspiration pneumonia - cont. Augmentin.  - urine culture + for 100,000 colonies of E. coli which is pansensitive. - Respiratory culture from 3 days ago currently remains negative.  In by physical therapy today and the recommend home with home health.  Will discharge likely tomorrow with home health services.  CODE STATUS: full  DVT Prophylaxis: heparin  TOTAL TIME TAKING CARE OF THIS PATIENT: *30* minutes.   POSSIBLE D/C IN the next 24 hrs, DEPENDING ON CLINICAL CONDITION.  Note: This dictation was prepared with Dragon dictation along with smaller phrase technology. Any transcriptional errors that result from this process are unintentional.  [name] on 08/30/2018 at 3:24 PM  Between 7am to 6pm - Pager - [phone]  After 6pm go to [email or web address] - password [email or web address] [facility]  Office  [phone]  CC: Primary care physician; Valera Castle, MDPatient ID: Gary Harmon, male   DOB: [birth year], 78 y.o.   MRN: [id number]

## 2018-08-30 NOTE — Evaluation (Signed)
Physical Therapy Evaluation Patient Details Name: Gary Harmon MRN: [id number] DOB: March 16, 1941 Today's Date: 08/30/2018   History of Present Illness  Patient is a pleasant 78 year old male admitted for CHF s/p cardiac arrest/collapsing at [employer] when shopping. Per previous documentation patient required EMS to proivde 4 shocks an was intubated upon admission. Patient is now extubated and on 2L of oxygen via nasal cannula. PMH includes CHF, CKD stage III. While hospitalized patient experienced one fall (5/14-5/15?) per documentation due to confusion. Patient is now oriented.   Clinical Impression  Patient is a pleasant 78 year old male who presents with diffuse weakness and limited stability s/p cardiac event. Patient is very pleasant however is quite verbose and tangential requiring frequent cueing for task orientation. Due to patient's high PLOF patient has maintained some strength, however diffuse LE weakness is noted with 4-/5 gross strength assessed. Patient requires use of RW for ambulation due to fatigue and limited capacity for prolonged mobility. Patient is agreeable to RW however will require continued education due to never using a walker before. While hospitalized patient will benefit from skilled physical therapy to address weakness and instability. Upon discharge patient will benefit from HHPT, a two wheeled walker, and a shower chair.     Follow Up Recommendations Home health PT    Equipment Recommendations  Rolling walker with 5" wheels;Other (comment)(shower chair)    Recommendations for Other Services       Precautions / Restrictions Precautions Precautions: Fall Restrictions Weight Bearing Restrictions: No      Mobility  Bed Mobility               General bed mobility comments: patient in chair upon PT arrival, left in chair upon end of PT session.   Transfers Overall transfer level: Needs assistance Equipment used: Rolling walker (2 wheeled) Transfers: Sit  to/from Stand Sit to Stand: Supervision;Min guard         General transfer comment: educated on proper hand placement with RW, CGA   Ambulation/Gait Ambulation/Gait assistance: Min guard Gait Distance (Feet): 160 Feet Assistive device: Rolling walker (2 wheeled)   Gait velocity: decreased   General Gait Details: Patient requires education on how to utilize walker, upright posture, and velocity of movement. Patient has narrow BOS with slight drift right/left with distraction.   Stairs            Wheelchair Mobility    Modified Rankin (Stroke Patients Only)       Balance Overall balance assessment: Needs assistance Sitting-balance support: No upper extremity supported;Feet supported Sitting balance-Leahy Scale: Fair Sitting balance - Comments: reach inside/outside BOS without LOB   Standing balance support: Bilateral upper extremity supported;During functional activity Standing balance-Leahy Scale: Fair Standing balance comment: Patient requires BUE support for prolonged ambulation. Able to static stand with SUE support without LOB                             Pertinent Vitals/Pain Pain Assessment: No/denies pain    Home Living Family/patient expects to be discharged to:: Private residence Living Arrangements: Spouse/significant other Available Help at Discharge: Family Type of Home: House(townhome) Home Access: Stairs to enter Entrance Stairs-Rails: Right;Left;Can reach both Entrance Stairs-Number of Steps: 3 Home Layout: Able to live on main level with bedroom/bathroom(attic is where tv, computers, "hang out room". ) Home Equipment: Grab bars - tub/shower;Grab bars - toilet Additional Comments: Per patient he did not use any AD's.  Prior Function Level of Independence: Independent         Comments: Per patient he is an avid golfer and very active. Did not require assistance for any ADLs or mobility. Did not use any AD's. is a retired Primary school teacher   Dominant Hand: Left    Extremity/Trunk Assessment   Upper Extremity Assessment Upper Extremity Assessment: Generalized weakness    Lower Extremity Assessment Lower Extremity Assessment: RLE deficits/detail;LLE deficits/detail RLE Deficits / Details: grossly 4-/5 RLE Sensation: WNL RLE Coordination: decreased gross motor LLE Deficits / Details: grossly 4-/5  LLE Sensation: WNL LLE Coordination: decreased gross motor    Cervical / Trunk Assessment Cervical / Trunk Assessment: Normal  Communication   Communication: No difficulties  Cognition Arousal/Alertness: Awake/alert Behavior During Therapy: WFL for tasks assessed/performed Overall Cognitive Status: Within Functional Limits for tasks assessed                                 General Comments: Pleasant and eager to participate. Very verbose and tangential       General Comments General comments (skin integrity, edema, etc.): brusing to RLE noted.     Exercises Other Exercises Other Exercises: patient educated on safe transfers and proper use of RW with need for correction of spacing of self within walker, hand placement, cueing for posture and propulsion    Assessment/Plan    PT Assessment Patient needs continued PT services  PT Problem List Decreased strength;Decreased activity tolerance;Decreased coordination;Decreased mobility;Decreased balance;Decreased knowledge of use of DME;Decreased safety awareness;Cardiopulmonary status limiting activity;Decreased knowledge of precautions       PT Treatment Interventions DME instruction;Gait training;Stair training;Therapeutic exercise;Therapeutic activities;Functional mobility training;Balance training;Neuromuscular re-education;Manual techniques;Patient/family education    PT Goals (Current goals can be found in the Care Plan section)  Acute Rehab PT Goals Patient Stated Goal: to return home and play golf again  PT  Goal Formulation: With patient Time For Goal Achievement: 09/13/18 Potential to Achieve Goals: Fair    Frequency Min 2X/week   Barriers to discharge Inaccessible home environment will require HHPT    Co-evaluation               AM-PAC PT "6 Clicks" Mobility  Outcome Measure Help needed turning from your back to your side while in a flat bed without using bedrails?: A Little Help needed moving from lying on your back to sitting on the side of a flat bed without using bedrails?: A Little Help needed moving to and from a bed to a chair (including a wheelchair)?: A Little Help needed standing up from a chair using your arms (e.g., wheelchair or bedside chair)?: A Little Help needed to walk in hospital room?: A Little Help needed climbing 3-5 steps with a railing? : A Little 6 Click Score: 18    End of Session Equipment Utilized During Treatment: Gait belt;Oxygen(2 L 02 via nasal cannula ) Activity Tolerance: Patient tolerated treatment well Patient left: in chair;with call bell/phone within reach Nurse Communication: Mobility status(in chair at window.) PT Visit Diagnosis: Unsteadiness on feet (R26.81);Other abnormalities of gait and mobility (R26.89);Muscle weakness (generalized) (M62.81);Difficulty in walking, not elsewhere classified (R26.2)    Time: 1321-1400 PT Time Calculation (min) (ACUTE ONLY): 39 min   Charges:   PT Evaluation $PT Eval Low Complexity: 1 Low PT Treatments $Gait Training: 8-22 mins $Therapeutic Activity: 8-22 mins  Janna Arch, PT, DPT    Janna Arch 08/30/2018, 2:43 PM

## 2018-08-30 NOTE — Progress Notes (Signed)
[place], [place] 08/30/18  Subjective:   78 year old Caucasian male with past medical history hypertension, obesity, lower extremity edema, chronic systolic heart failure with ejection fraction of 40%, obstructive sleep apnea, gout, and depression  Potassium is stable at 4.0 Serum creatinine appears to be starting to improve at 5.52.  Urine output 700 cc.  Patient feels much better today.  States he feels like 1,000,000 bucks, 9 out of 10 He has some lower extremity edema Denies any pain, shortness of breath or cough Sat in the chair yesterday      Objective:  Vital signs in last 24 hours:  Temp:  [97.5 F (36.4 C)-98 F (36.7 C)] 98 F (36.7 C) (05/17 0800) Pulse Rate:  [48-84] 84 (05/17 0800) Resp:  [16-24] 16 (05/17 0800) BP: (127-134)/(83-99) 134/88 (05/17 0800) SpO2:  [93 %-97 %] 95 % (05/17 0800)  Weight change:  Filed Weights   08/25/18 0910 08/25/18 1234 08/27/18 1659  Weight: 86.2 kg 89.2 kg 92.9 kg    Intake/Output:    Intake/Output Summary (Last 24 hours) at 08/30/2018 1044 Last data filed at 08/30/2018 0109 Gross per 24 hour  Intake -  Output 576 ml  Net -576 ml     Physical Exam: General: Critically ill appearing  HEENT Moist oral mucus membranes,    Neck supple  Pulm/lungs Coarse to auscultation bilaterally  CVS/Heart Regular, tachycardic  Abdomen:  Soft. NT  Extremities: + pitting edema  Neurologic: Alert, able to follow commands appropriately  Skin: No acute rashes          Basic Metabolic Panel:  Recent Labs  Lab 08/25/18 1040  08/26/18 0143 08/26/18 0431 08/27/18 0449 08/27/18 1418 08/28/18 0655 08/29/18 0929 08/30/18 0450  NA  --   --   --  142 137  --  137 140 141  K <2.0*   < > 3.3* 3.4* 3.6 3.5 4.3 4.2 4.0  CL  --   --   --  101 97*  --  100 105 103  CO2  --   --   --  25 23  --  21* 24 26  GLUCOSE  --   --   --  127* 91  --  113* 99 104*  BUN  --   --   --  29* 40*  --  52* 58* 60*   CREATININE  --   --   --  2.60* 3.75*  --  4.98* 5.58* 5.52*  CALCIUM  --   --   --  8.7* 8.1*  --  8.1* 8.4* 8.4*  MG 0.8*  --  1.8  --  2.4  --   --   --   --   PHOS  --   --   --   --   --   --   --  3.7 3.7   < > = values in this interval not displayed.     CBC: Recent Labs  Lab 08/25/18 0913 08/26/18 0431 08/27/18 0449 08/28/18 0655  WBC 14.8* 12.5* 10.4 9.2  NEUTROABS 8.7* 10.6*  --  7.4  HGB 10.8* 10.7* 10.1* 10.2*  HCT 34.8* 33.5* 31.7* 31.6*  MCV 99.1 95.4 95.2 92.9  PLT 312 319 230 251     No results found for: HEPBSAG, HEPBSAB, HEPBIGM    Microbiology:  Recent Results (from the past 240 hour(s))  SARS Coronavirus 2 (CEPHEID - Performed in [place] hospital lab), [facility]  Status: None   Collection Time: 08/25/18  9:13 AM  Result Value Ref Range Status   SARS Coronavirus 2 NEGATIVE NEGATIVE Final    Comment: (NOTE) If result is NEGATIVE SARS-CoV-2 target nucleic acids are NOT DETECTED. The SARS-CoV-2 RNA is generally detectable in upper and lower  respiratory specimens during the acute phase of infection. The lowest  concentration of SARS-CoV-2 viral copies this assay can detect is 250  copies / mL. A negative result does not preclude SARS-CoV-2 infection  and should not be used as the sole basis for treatment or other  patient management decisions.  A negative result may occur with  improper specimen collection / handling, submission of specimen other  than nasopharyngeal swab, presence of viral mutation(s) within the  areas targeted by this assay, and inadequate number of viral copies  (<250 copies / mL). A negative result must be combined with clinical  observations, patient history, and epidemiological information. If result is POSITIVE SARS-CoV-2 target nucleic acids are DETECTED. The SARS-CoV-2 RNA is generally detectable in upper and lower  respiratory specimens dur ing the acute phase of infection.  Positive  results are indicative of  active infection with SARS-CoV-2.  Clinical  correlation with patient history and other diagnostic information is  necessary to determine patient infection status.  Positive results do  not rule out bacterial infection or co-infection with other viruses. If result is PRESUMPTIVE POSTIVE SARS-CoV-2 nucleic acids MAY BE PRESENT.   A presumptive positive result was obtained on the submitted specimen  and confirmed on repeat testing.  While 2019 novel coronavirus  (SARS-CoV-2) nucleic acids may be present in the submitted sample  additional confirmatory testing may be necessary for epidemiological  and / or clinical management purposes  to differentiate between  SARS-CoV-2 and other Sarbecovirus currently known to infect humans.  If clinically indicated additional testing with an alternate test  methodology [phone]) is advised. The SARS-CoV-2 RNA is generally  detectable in upper and lower respiratory sp ecimens during the acute  phase of infection. The expected result is Negative. Fact Sheet for Patients:  StrictlyIdeas.no Fact Sheet for Healthcare Providers: BankingDealers.co.za This test is not yet approved or cleared by the [place] FDA and has been authorized for detection and/or diagnosis of SARS-CoV-2 by FDA under an Emergency Use Authorization (EUA).  This EUA will remain in effect (meaning this test can be used) for the duration of the COVID-19 declaration under Section 564(b)(1) of the Act, 21 U.S.C. section 360bbb-3(b)(1), unless the authorization is terminated or revoked sooner. Performed at [facility], [address] [place], [place] [address]   MRSA PCR Screening     Status: Abnormal   Collection Time: 08/25/18 12:37 PM  Result Value Ref Range Status   MRSA by PCR (A) NEGATIVE Final    INVALID, UNABLE TO DETERMINE THE PRESENCE OF TARGET DNA DUE TO SPECIMEN INTEGRITY. RECOLLECTION REQUESTED.    Comment:  RESULT CALLED TO, READ BACK BY AND VERIFIED WITH: CALLED TO TAYLOR @1518  08/25/2018 [facility] Performed at [facility], [address] [place], [place] [address]   C difficile quick scan w PCR reflex     Status: None   Collection Time: 08/25/18  2:28 PM  Result Value Ref Range Status   C Diff antigen NEGATIVE NEGATIVE Final   C Diff toxin NEGATIVE NEGATIVE Final   C Diff interpretation No C. difficile detected.  Final    Comment: Performed at [facility], [address] [place], [place]  [address]  Culture, Urine     Status: Abnormal   Collection Time: 08/26/18  2:51 AM  Result Value Ref Range Status   Specimen Description   Final    URINE, RANDOM Performed at [facility], [address], [place], [place] [address]    Special Requests   Final    Normal Performed at [facility], [address] [place], [place] [address]    Culture >=100,000 COLONIES/mL ESCHERICHIA COLI (A)  Final   Report Status 08/28/2018 FINAL  Final   Organism ID, Bacteria ESCHERICHIA COLI (A)  Final      Susceptibility   Escherichia coli - MIC*    AMPICILLIN <=2 SENSITIVE Sensitive     CEFAZOLIN <=4 SENSITIVE Sensitive     CEFTRIAXONE <=1 SENSITIVE Sensitive     CIPROFLOXACIN <=0.25 SENSITIVE Sensitive     GENTAMICIN <=1 SENSITIVE Sensitive     IMIPENEM <=0.25 SENSITIVE Sensitive     NITROFURANTOIN <=16 SENSITIVE Sensitive     TRIMETH/SULFA <=20 SENSITIVE Sensitive     AMPICILLIN/SULBACTAM <=2 SENSITIVE Sensitive     PIP/TAZO <=4 SENSITIVE Sensitive     Extended ESBL NEGATIVE Sensitive     * >=100,000 COLONIES/mL ESCHERICHIA COLI  Culture, respiratory (non-expectorated)     Status: None   Collection Time: 08/26/18  5:27 AM  Result Value Ref Range Status   Specimen Description   Final    TRACHEAL ASPIRATE Performed at [facility], [address] [place], [place] [address]    Special Requests   Final    Normal Performed at [facility], [address] [place], [place] [address]    Gram Stain   Final    NO WBC SEEN RARE GRAM POSITIVE COCCI IN PAIRS RARE GRAM POSITIVE RODS    Culture   Final    MODERATE MORAXELLA CATARRHALIS(BRANHAMELLA) BETA LACTAMASE NEGATIVE Performed at [facility], [place] [address], [place], [place] [address]    Report Status 08/28/2018 FINAL  Final  CULTURE, BLOOD (ROUTINE X 2) w Reflex to ID Panel     Status: None (Preliminary result)   Collection Time: 08/26/18  5:59 AM  Result Value Ref Range Status   Specimen Description BLOOD RIGHT HAND  Final   Special Requests   Final    BOTTLES DRAWN AEROBIC AND ANAEROBIC Blood Culture adequate volume   Culture   Final    NO GROWTH 4 DAYS Performed at [facility], [address] [place], [place] [address]    Report Status PENDING  Incomplete  CULTURE, BLOOD (ROUTINE X 2) w Reflex to ID Panel     Status: None (Preliminary result)   Collection Time: 08/26/18  6:11 AM  Result Value Ref Range Status   Specimen Description BLOOD BLOOD LEFT WRIST  Final   Special Requests   Final    BOTTLES DRAWN AEROBIC AND ANAEROBIC Blood Culture results may not be optimal due to an inadequate volume of blood received in culture bottles   Culture   Final    NO GROWTH 4 DAYS Performed at [facility], [address] [place],  [address]    Report Status PENDING  Incomplete  MRSA PCR Screening     Status: None   Collection Time: 08/26/18  9:31 AM  Result Value Ref Range Status   MRSA by PCR NEGATIVE NEGATIVE Final    Comment:        The GeneXpert MRSA Assay (FDA approved for NASAL specimens only),  is one component of a comprehensive MRSA colonization surveillance program. It is not intended to diagnose MRSA infection nor to guide or monitor treatment for MRSA infections. Performed at [facility], [address] [place], [place] [address]     Coagulation Studies: No results for input(s): LABPROT, INR in the last 72  hours.  Urinalysis: No results for input(s): COLORURINE, LABSPEC, PHURINE, GLUCOSEU, HGBUR, BILIRUBINUR, KETONESUR, PROTEINUR, UROBILINOGEN, NITRITE, LEUKOCYTESUR in the last 72 hours.  Invalid input(s): APPERANCEUR    Imaging: No results found.   Medications:   . sodium chloride Stopped (08/28/18 0148)   . amoxicillin-clavulanate  1 tablet Oral Q12H  . carvedilol  3.125 mg Oral BID WC  . citalopram  10 mg Oral Daily  . heparin  5,000 Units Subcutaneous Q8H  . mouth rinse  15 mL Mouth Rinse BID     Assessment/ Plan:  78 y.o. Caucasian male  with past medical history hypertension, obesity, lower extremity edema, chronic systolic heart failure with ejection fraction of 40%, obstructive sleep apnea, gout, and depression  1. Severe Hypokalemia 2. AKI on CKD st 3. Baseline Cr 1.50/GFR 45 on 08/17/2018 3. Hypomagnesemia 4. Acute resp failure, post outpatient cardiac arrest 5. LE Edema 6.  UTI, E. coli  Plan: AKI likely due to ATN,  Monitor UOP closely Urine creatinine starting to improve.  If there is significant improvement on Monday, discharged to home can be considered.  Potassium levels remains normal.  Continue observation   LOS: [place] 5/17/202010:44 AM  [place], [place]  Note: This note was prepared with Dragon dictation. Any transcription errors are unintentional

## 2018-08-31 LAB — RENAL FUNCTION PANEL
Albumin: 2.7 g/dL — ABNORMAL LOW (ref 3.5–5.0)
Anion gap: 14 (ref 5–15)
BUN: 62 mg/dL — ABNORMAL HIGH (ref 8–23)
CO2: 24 mmol/L (ref 22–32)
Calcium: 8.3 mg/dL — ABNORMAL LOW (ref 8.9–10.3)
Chloride: 102 mmol/L (ref 98–111)
Creatinine, Ser: 5.99 mg/dL — ABNORMAL HIGH (ref 0.61–1.24)
GFR calc Af Amer: 10 mL/min — ABNORMAL LOW (ref >60)
GFR calc non Af Amer: 8 mL/min — ABNORMAL LOW (ref >60)
Glucose, Bld: 88 mg/dL (ref 70–99)
Phosphorus: 3.8 mg/dL (ref 2.5–4.6)
Potassium: 3.7 mmol/L (ref 3.5–5.1)
Sodium: 140 mmol/L (ref 135–145)

## 2018-08-31 LAB — CULTURE, BLOOD (ROUTINE X 2)
Culture: NO GROWTH
Culture: NO GROWTH
Special Requests: ADEQUATE

## 2018-08-31 MED ORDER — SODIUM CHLORIDE 0.9% FLUSH
3.0000 mL | Freq: Two times a day (BID) | INTRAVENOUS | Status: DC
  Administered 2018-08-31 – 2018-09-03 (×6): 3 mL via INTRAVENOUS

## 2018-08-31 MED ORDER — SODIUM CHLORIDE 0.9% FLUSH
3.0000 mL | Freq: Two times a day (BID) | INTRAVENOUS | Status: DC
  Administered 2018-08-31: 3 mL via INTRAVENOUS

## 2018-08-31 NOTE — TOC Progression Note (Signed)
Transition of Care [facility]) - Progression Note    Patient Details  Name: Gary Harmon MRN: [id number] Date of Birth: [birth year]  [facility] CM/SW Contact  Elza Rafter, RN Phone Number: 08/31/2018, 10:19 AM  Clinical Narrative:    Barrier-creatinine trending up; plan for Dr. Holley Raring to see patient today.  Offered home health services; patient agreeable only to home health PT.  Referral to [facility] as they are in network with his [facility] plan; patient is agreeable to them as well.  Will notify [facility] at discharge.     Expected Discharge Plan: Home/Self Care Barriers to Discharge: Continued Medical Work up  Expected Discharge Plan and Services Expected Discharge Plan: Home/Self Care       Living arrangements for the past 2 months: Single Family Home Expected Discharge Date: 08/29/18                                     Social Determinants of Health (SDOH) Interventions    Readmission Risk Interventions No flowsheet data found.

## 2018-08-31 NOTE — Progress Notes (Signed)
   08/31/18 1000  Clinical Encounter Type  Visited With Patient  Visit Type Follow-up  Ch made a follow up visit. Pt was visibly doing better physically and emotionally. Pt greeted ch with such a loud and cheery voice and said that he was doing wonderful this morning. Pt talked about going back to normal even though he does not know how to define his new normal. Pt valued independence, love, connection, and faith. Talking about his wife and his relationship with his heart doctor Renetta Chalk) always lifts up his moods. 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

## 2018-08-31 NOTE — Care Management Important Message (Signed)
Important Message  Patient Details  Name: Gary Harmon MRN: [id number] Date of Birth: 1940-07-18   Medicare Important Message Given:  Yes    Elza Rafter, RN 08/31/2018, 10:47 AM

## 2018-08-31 NOTE — Progress Notes (Signed)
[facility] NAME: Gary Harmon    MR#:  [id number]  DATE OF BIRTH:  Aug 20, 1940  SUBJECTIVE:   Cr a bit worse today but pt. Is urinating well.  No complaints and no acute events overnight.   REVIEW OF SYSTEMS:   Review of Systems  Constitutional: Negative for chills, fever and weight loss.  HENT: Negative for ear discharge, ear pain and nosebleeds.   Eyes: Negative for blurred vision, pain and discharge.  Respiratory: Negative for cough, sputum production, shortness of breath, wheezing and stridor.   Cardiovascular: Negative for chest pain, palpitations, orthopnea and PND.  Gastrointestinal: Negative for abdominal pain, diarrhea, nausea and vomiting.  Genitourinary: Negative for frequency and urgency.  Musculoskeletal: Negative for back pain and joint pain.  Neurological: Positive for weakness (generalized). Negative for sensory change, speech change and focal weakness.  Psychiatric/Behavioral: Negative for depression and hallucinations. The patient is not nervous/anxious.    Tolerating Diet: yes Tolerating PT: Eval noted.   DRUG ALLERGIES:  No Known Allergies  VITALS:  Blood pressure (!) 159/91, pulse 96, temperature 97.7 F (36.5 C), temperature source Oral, resp. rate 18, height 5\' 7"  (1.702 m), weight 92.9 kg, SpO2 94 %.  PHYSICAL EXAMINATION:   Physical Exam  GENERAL:  78 y.o.-year-old patient sitting up in chair in no acute distress.  EYES: Pupils equal, round, reactive to light and accommodation. No scleral icterus. Extraocular muscles intact.  HEENT: Head atraumatic, normocephalic. Oropharynx and nasopharynx clear.  NECK:  Supple, no jugular venous distention. No thyroid enlargement, no tenderness.  LUNGS: Good a/e bl, no wheezing, rales, rhonchi. No use of accessory muscles of respiration.  CARDIOVASCULAR: S1, S2 normal. No murmurs, rubs, or gallops.  ABDOMEN: Soft, nontender, nondistended. Bowel sounds  present. No organomegaly or mass.  EXTREMITIES: No cyanosis, clubbing or edema b/l.    NEUROLOGIC: Cranial nerves II through XII are intact. No focal Motor or sensory deficits b/l. Globally weak  PSYCHIATRIC:  patient is alert and oriented x 3.  SKIN: No obvious rash, lesion, or ulcer.   LABORATORY PANEL:  CBC Recent Labs  Lab 08/28/18 0655  WBC 9.2  HGB 10.2*  HCT 31.6*  PLT 251    Chemistries  Recent Labs  Lab 08/27/18 0449  08/28/18 0655  08/31/18 0530  NA 137  --  137   < > 140  K 3.6   < > 4.3   < > 3.7  CL 97*  --  100   < > 102  CO2 23  --  21*   < > 24  GLUCOSE 91  --  113*   < > 88  BUN 40*  --  52*   < > 62*  CREATININE 3.75*  --  4.98*   < > 5.99*  CALCIUM 8.1*  --  8.1*   < > 8.3*  MG 2.4  --   --   --   --   AST 925*  --  354*  --   --   ALT 973*  --  708*  --   --   ALKPHOS 65  --  71  --   --   BILITOT 0.6  --  0.7  --   --    < > = values in this interval not displayed.   Cardiac Enzymes Recent Labs  Lab 08/28/18 0655  TROPONINI 2.74*   RADIOLOGY:  No results found. ASSESSMENT AND PLAN:  Gary Harmon  is a 78 y.o. male with a known history of congestive heart failure systolic, chronic kidney disease stage III, hypertension, gout comes to the emergency room after he had a cardiac arrest at [employer] while shopping. According to the wife and ER records patient was having a routine day and while at checkout per wife he all of a sudden collapsed.   1. Cardiorespiratory arrest. - much improved and now extubated.  - suspected due to severe electrolyte abnormality and currently stable.   2. Severe electrolyte abnormality with hypokalemia and severe hypomagnesimia - Improved with supplementation and currently normal. - Patient was on Lokelma as an outpatient due to worsening hyperkalemia due to Aldactone.  Now much improved.  We will continue to monitor serial electrolytes.  3. Acute on Chronic kidney disease stage IV with ATN  Due to cardiorespiratory  arrest  -baseline creatinine around 1.5--2.6--3.75--4.98--5.5--5.9 today - Urine output is fairly good and no acute indication for hemodialysis. -Continue to follow BUN/creatinine and urine output.  Renal dose meds, avoid nephrotoxins. - nephrology following.  4. Elevated troponin in the setting of cardiac arrest and ATN - no history of CAD per family - Dr. Bethanne Ginger input appreciated--no further cardiac diagnostics at present  5.elevated transaminases -appears shock liver with Cardiac arrest - normalized.  6. UTI/Aspiration pneumonia - cont. Augmentin.  - urine culture + for 100,000 colonies of E. coli which is pansensitive. - Respiratory culture from 3 days ago currently remains negative.  Seen by physical therapy and the recommend home health services and will arrange prior to discharge.  Patient likely be discharged home once renal function starts to trend down. Discussed with Nephrology.   CODE STATUS: full  DVT Prophylaxis: heparin  TOTAL TIME TAKING CARE OF THIS PATIENT: *30* minutes.   POSSIBLE D/C IN 1-2 days , DEPENDING ON CLINICAL CONDITION.  Note: This dictation was prepared with Dragon dictation along with smaller phrase technology. Any transcriptional errors that result from this process are unintentional.  [name] on 08/31/2018 at 12:48 PM  Between 7am to 6pm - Pager - [phone]  After 6pm go to [email or web address] - password [email or web address] [facility]  Office  [phone]  CC: Primary care physician; Valera Castle, MDPatient ID: Gary Harmon, male   DOB: [birth year], 78 y.o.   MRN: [id number]

## 2018-08-31 NOTE — Progress Notes (Signed)
[facility], [place] 08/31/18  Subjective:  Renal function a bit worse today. Creatinine up to 5.9. Urine output thus far this a.m. has been 300 cc. Overall renal function low however with an EGFR of 8. Potassium currently normal at 3.7.    Objective:  Vital signs in last 24 hours:  Temp:  [97.6 F (36.4 C)-98.7 F (37.1 C)] 97.7 F (36.5 C) (05/18 0745) Pulse Rate:  [79-96] 96 (05/18 0745) Resp:  [16-18] 18 (05/18 0403) BP: (117-159)/(79-91) 159/91 (05/18 0745) SpO2:  [94 %-100 %] 94 % (05/18 0745)  Weight change:  Filed Weights   08/25/18 0910 08/25/18 1234 08/27/18 1659  Weight: 86.2 kg 89.2 kg 92.9 kg    Intake/Output:    Intake/Output Summary (Last 24 hours) at 08/31/2018 1152 Last data filed at 08/31/2018 1019 Gross per 24 hour  Intake -  Output 775 ml  Net -775 ml     Physical Exam: General:  No acute distress  HEENT Moist oral mucus membranes, hearing intact  Neck supple  Pulm/lungs scattered rhonchi, normal effort  CVS/Heart Regular  Abdomen:  Soft. NTND  Extremities: + pitting edema  Neurologic: Alert, able to follow commands appropriately  Skin: No acute rashes          Basic Metabolic Panel:  Recent Labs  Lab 08/25/18 1040  08/26/18 0143  08/27/18 0449 08/27/18 1418 08/28/18 0655 08/29/18 0929 08/30/18 0450 08/31/18 0530  NA  --   --   --    < > 137  --  137 140 141 140  K <2.0*   < > 3.3*   < > 3.6 3.5 4.3 4.2 4.0 3.7  CL  --   --   --    < > 97*  --  100 105 103 102  CO2  --   --   --    < > 23  --  21* _0 GLUCOSE  --   --   --    < > 91  --  113* 99 104* 88  BUN  --   --   --    < > 40*  --  52* 58* 60* 62*  CREATININE  --   --   --    < > 3.75*  --  4.98* 5.58* 5.52* 5.99*  CALCIUM  --   --   --    < > 8.1*  --  8.1* 8.4* 8.4* 8.3*  MG 0.8*  --  1.8  --  2.4  --   --   --   --   --   PHOS  --   --   --   --   --   --   --  3.7 3.7 3.8   < > = values in this interval not displayed.      CBC: Recent Labs  Lab 08/25/18 0913 08/26/18 0431 08/27/18 0449 08/28/18 0655  WBC 14.8* 12.5* 10.4 9.2  NEUTROABS 8.7* 10.6*  --  7.4  HGB 10.8* 10.7* 10.1* 10.2*  HCT 34.8* 33.5* 31.7* 31.6*  MCV 99.1 95.4 95.2 92.9  PLT 312 319 230 251     No results found for: HEPBSAG, HEPBSAB, HEPBIGM    Microbiology:  Recent Results (from the past 240 hour(s))  SARS Coronavirus 2 (CEPHEID - Performed in [place] hospital lab), Hosp Order     Status: None   Collection Time: 08/25/18  9:13 AM  Result Value Ref Range  Status   SARS Coronavirus 2 NEGATIVE NEGATIVE Final    Comment: (NOTE) If result is NEGATIVE SARS-CoV-2 target nucleic acids are NOT DETECTED. The SARS-CoV-2 RNA is generally detectable in upper and lower  respiratory specimens during the acute phase of infection. The lowest  concentration of SARS-CoV-2 viral copies this assay can detect is 250  copies / mL. A negative result does not preclude SARS-CoV-2 infection  and should not be used as the sole basis for treatment or other  patient management decisions.  A negative result may occur with  improper specimen collection / handling, submission of specimen other  than nasopharyngeal swab, presence of viral mutation(s) within the  areas targeted by this assay, and inadequate number of viral copies  (<250 copies / mL). A negative result must be combined with clinical  observations, patient history, and epidemiological information. If result is POSITIVE SARS-CoV-2 target nucleic acids are DETECTED. The SARS-CoV-2 RNA is generally detectable in upper and lower  respiratory specimens dur ing the acute phase of infection.  Positive  results are indicative of active infection with SARS-CoV-2.  Clinical  correlation with patient history and other diagnostic information is  necessary to determine patient infection status.  Positive results do  not rule out bacterial infection or co-infection with other viruses. If  result is PRESUMPTIVE POSTIVE SARS-CoV-2 nucleic acids MAY BE PRESENT.   A presumptive positive result was obtained on the submitted specimen  and confirmed on repeat testing.  While 2019 novel coronavirus  (SARS-CoV-2) nucleic acids may be present in the submitted sample  additional confirmatory testing may be necessary for epidemiological  and / or clinical management purposes  to differentiate between  SARS-CoV-2 and other Sarbecovirus currently known to infect humans.  If clinically indicated additional testing with an alternate test  methodology [id number]) is advised. The SARS-CoV-2 RNA is generally  detectable in upper and lower respiratory sp ecimens during the acute  phase of infection. The expected result is Negative. Fact Sheet for Patients:  StrictlyIdeas.no Fact Sheet for Healthcare Providers: BankingDealers.co.za This test is not yet approved or cleared by the [place] FDA and has been authorized for detection and/or diagnosis of SARS-CoV-2 by FDA under an Emergency Use Authorization (EUA).  This EUA will remain in effect (meaning this test can be used) for the duration of the COVID-19 declaration under Section 564(b)(1) of the Act, 21 U.S.C. section 360bbb-3(b)(1), unless the authorization is terminated or revoked sooner. Performed at [facility], [address] [place], [place] [address]   MRSA PCR Screening     Status: Abnormal   Collection Time: 08/25/18 12:37 PM  Result Value Ref Range Status   MRSA by PCR (A) NEGATIVE Final    INVALID, UNABLE TO DETERMINE THE PRESENCE OF TARGET DNA DUE TO SPECIMEN INTEGRITY. RECOLLECTION REQUESTED.    Comment: RESULT CALLED TO, READ BACK BY AND VERIFIED WITH: CALLED TO TAYLOR _0  08/25/2018 [facility] Performed at [facility], [address] [place], [place] [address]   C difficile quick scan w PCR reflex     Status: None   Collection Time: 08/25/18  2:28 PM   Result Value Ref Range Status   C Diff antigen NEGATIVE NEGATIVE Final   C Diff toxin NEGATIVE NEGATIVE Final   C Diff interpretation No C. difficile detected.  Final    Comment: Performed at [facility], [address] [place], [place] [address]  Culture, Urine     Status: Abnormal   Collection Time: 08/26/18  2:51 AM  Result Value Ref Range Status   Specimen Description   Final    URINE, RANDOM Performed at [facility], [address] [place], [place] [address]    Special Requests   Final    Normal Performed at [facility], [address] [place], [place] [address]    Culture >=100,000 COLONIES/mL ESCHERICHIA COLI (A)  Final   Report Status 08/28/2018 FINAL  Final   Organism ID, Bacteria ESCHERICHIA COLI (A)  Final      Susceptibility   Escherichia coli - MIC*    AMPICILLIN <=2 SENSITIVE Sensitive     CEFAZOLIN <=4 SENSITIVE Sensitive     CEFTRIAXONE <=1 SENSITIVE Sensitive     CIPROFLOXACIN <=0.25 SENSITIVE Sensitive     GENTAMICIN <=1 SENSITIVE Sensitive     IMIPENEM <=0.25 SENSITIVE Sensitive     NITROFURANTOIN <=16 SENSITIVE Sensitive     TRIMETH/SULFA <=20 SENSITIVE Sensitive     AMPICILLIN/SULBACTAM <=2 SENSITIVE Sensitive     PIP/TAZO <=4 SENSITIVE Sensitive     Extended ESBL NEGATIVE Sensitive     * >=100,000 COLONIES/mL ESCHERICHIA COLI  Culture, respiratory (non-expectorated)     Status: None   Collection Time: 08/26/18  5:27 AM  Result Value Ref Range Status   Specimen Description   Final    TRACHEAL ASPIRATE Performed at [facility], [address], [place], [place] [address]    Special Requests   Final    Normal Performed at [facility], [address] [place], [place] [address]    Gram Stain   Final    NO WBC SEEN RARE GRAM POSITIVE COCCI IN PAIRS RARE GRAM POSITIVE RODS    Culture   Final    MODERATE MORAXELLA CATARRHALIS(BRANHAMELLA) BETA LACTAMASE NEGATIVE Performed at [facility], [place] [place], [place] [address]    Report Status 08/28/2018 FINAL  Final  CULTURE, BLOOD (ROUTINE X 2) w Reflex to ID Panel     Status: None   Collection Time: 08/26/18  5:59 AM  Result Value Ref Range Status   Specimen Description BLOOD RIGHT HAND  Final   Special Requests   Final    BOTTLES DRAWN AEROBIC AND ANAEROBIC Blood Culture adequate volume   Culture   Final    NO GROWTH 5 DAYS Performed at [facility], [address] [place], [place] [address]    Report Status 08/31/2018 FINAL  Final  CULTURE, BLOOD (ROUTINE X 2) w Reflex to ID Panel     Status: None   Collection Time: 08/26/18  6:11 AM  Result Value Ref Range Status   Specimen Description BLOOD BLOOD LEFT WRIST  Final   Special Requests   Final    BOTTLES DRAWN AEROBIC AND ANAEROBIC Blood Culture results may not be optimal due to an inadequate volume of blood received in culture bottles   Culture   Final    NO GROWTH 5 DAYS Performed at [facility], [address] [place], [place] [address]    Report Status 08/31/2018 FINAL  Final  MRSA PCR Screening     Status: None   Collection Time: 08/26/18  9:31 AM  Result Value Ref Range Status   MRSA by PCR NEGATIVE NEGATIVE Final    Comment:        The GeneXpert MRSA Assay (FDA approved for NASAL specimens only), is one component of a comprehensive MRSA colonization surveillance program. It is not intended to diagnose MRSA infection  nor to guide or monitor treatment for MRSA infections. Performed at [facility], [address] [place], [place] [address]     Coagulation Studies: No results for input(s): LABPROT, INR in the last 72 hours.  Urinalysis: No results for input(s): COLORURINE, LABSPEC, PHURINE, GLUCOSEU, HGBUR, BILIRUBINUR, KETONESUR, PROTEINUR, UROBILINOGEN, NITRITE, LEUKOCYTESUR in the last 72 hours.  Invalid input(s): APPERANCEUR    Imaging: No results found.   Medications:   . sodium  chloride Stopped (08/28/18 0148)   . amoxicillin-clavulanate  1 tablet Oral Q12H  . carvedilol  3.125 mg Oral BID WC  . citalopram  10 mg Oral Daily  . heparin  5,000 Units Subcutaneous Q8H  . mouth rinse  15 mL Mouth Rinse BID     Assessment/ Plan:  78 y.o. Caucasian male  with past medical history hypertension, obesity, lower extremity edema, chronic systolic heart failure with ejection fraction of 40%, obstructive sleep apnea, gout, and depression  1. Severe Hypokalemia 2. AKI on CKD st 3. Baseline Cr 1.50/GFR 45 on 08/17/2018 3. Hypomagnesemia 4. Acute resp failure, post outpatient cardiac arrest 5. LE Edema 6.  UTI, E. coli  Plan: Renal function has worsened a bit.  Creatinine currently 5.99 with an EGFR of 8.  Urine output this a.m. has been 300 cc.  No urgent indication for dialysis at the moment as serum electrolytes are currently acceptable.  We will continue to monitor renal parameters closely.  If they continue to worsen we may need to consider temporary dialysis however.  Further plan as patient progresses.   LOS: 6 Cerissa Zeiger 5/18/202011:52 AM  [place], [place]  Note: This note was prepared with Dragon dictation. Any transcription errors are unintentional

## 2018-09-01 LAB — CBC
HCT: 31 % — ABNORMAL LOW (ref 39.0–52.0)
Hemoglobin: 9.9 g/dL — ABNORMAL LOW (ref 13.0–17.0)
MCH: 30.7 pg (ref 26.0–34.0)
MCHC: 31.9 g/dL (ref 30.0–36.0)
MCV: 96.3 fL (ref 80.0–100.0)
Platelets: 247 10*3/uL (ref 150–400)
RBC: 3.22 MIL/uL — ABNORMAL LOW (ref 4.22–5.81)
RDW: 14.1 % (ref 11.5–15.5)
WBC: 10.3 10*3/uL (ref 4.0–10.5)
nRBC: 0.2 % (ref 0.0–0.2)

## 2018-09-01 LAB — RENAL FUNCTION PANEL
Albumin: 2.9 g/dL — ABNORMAL LOW (ref 3.5–5.0)
Anion gap: 12 (ref 5–15)
BUN: 60 mg/dL — ABNORMAL HIGH (ref 8–23)
CO2: 23 mmol/L (ref 22–32)
Calcium: 8.8 mg/dL — ABNORMAL LOW (ref 8.9–10.3)
Chloride: 106 mmol/L (ref 98–111)
Creatinine, Ser: 5.13 mg/dL — ABNORMAL HIGH (ref 0.61–1.24)
GFR calc Af Amer: 12 mL/min — ABNORMAL LOW (ref >60)
GFR calc non Af Amer: 10 mL/min — ABNORMAL LOW (ref >60)
Glucose, Bld: 98 mg/dL (ref 70–99)
Phosphorus: 3.9 mg/dL (ref 2.5–4.6)
Potassium: 4 mmol/L (ref 3.5–5.1)
Sodium: 141 mmol/L (ref 135–145)

## 2018-09-01 MED ORDER — ZINC OXIDE 40 % EX OINT
TOPICAL_OINTMENT | CUTANEOUS | Status: DC | PRN
  Filled 2018-09-01: qty 113

## 2018-09-01 NOTE — Progress Notes (Signed)
Patient and family requesting regular bed d/t discomfort from low bed, cleared with director, patient placed back on regular bed, floor mats in place. Currently OOB to recliner with chair alarm. Educated to call for assistance.

## 2018-09-01 NOTE — Progress Notes (Signed)
[facility], [place] 09/01/18  Subjective:  Urine output over the preceding 24 hours was 675 cc. Creatinine down to 5.1. Patient laying in bed this a.m.    Objective:  Vital signs in last 24 hours:  Temp:  [97.3 F (36.3 C)-99 F (37.2 C)] 97.3 F (36.3 C) (05/19 0835) Pulse Rate:  [89-96] 95 (05/19 0835) Resp:  [20] 20 (05/19 0835) BP: (127-151)/(80-97) 151/89 (05/19 0835) SpO2:  [93 %-100 %] 97 % (05/19 0835) Weight:  [90.3 kg] 90.3 kg (05/19 0433)  Weight change:  Filed Weights   08/25/18 1234 08/27/18 1659 09/01/18 0433  Weight: 89.2 kg 92.9 kg 90.3 kg    Intake/Output:    Intake/Output Summary (Last 24 hours) at 09/01/2018 1333 Last data filed at 09/01/2018 0920 Gross per 24 hour  Intake -  Output 675 ml  Net -675 ml     Physical Exam: General: No acute distress  HEENT Moist oral mucus membranes, hearing intact  Neck supple  Pulm/lungs scattered rhonchi, normal effort  CVS/Heart Regular  Abdomen:  Soft. NTND  Extremities: + pitting edema  Neurologic: Alert, able to follow commands appropriately  Skin: No acute rashes          Basic Metabolic Panel:  Recent Labs  Lab 08/26/18 0143  08/27/18 0449  08/28/18 0655 08/29/18 0929 08/30/18 0450 08/31/18 0530 09/01/18 0422  NA  --    < > 137  --  137 140 141 140 141  K 3.3*   < > 3.6   < > 4.3 4.2 4.0 3.7 4.0  CL  --    < > 97*  --  100 105 103 102 106  CO2  --    < > 23  --  21* _0 GLUCOSE  --    < > 91  --  113* 99 104* 88 98  BUN  --    < > 40*  --  52* 58* 60* 62* 60*  CREATININE  --    < > 3.75*  --  4.98* 5.58* 5.52* 5.99* 5.13*  CALCIUM  --    < > 8.1*  --  8.1* 8.4* 8.4* 8.3* 8.8*  MG 1.8  --  2.4  --   --   --   --   --   --   PHOS  --   --   --   --   --  3.7 3.7 3.8 3.9   < > = values in this interval not displayed.     CBC: Recent Labs  Lab 08/26/18 0431 08/27/18 0449 08/28/18 0655 09/01/18 0422  WBC 12.5* 10.4 9.2 10.3  NEUTROABS 10.6*   --  7.4  --   HGB 10.7* 10.1* 10.2* 9.9*  HCT 33.5* 31.7* 31.6* 31.0*  MCV 95.4 95.2 92.9 96.3  PLT 319 230 251 247     No results found for: HEPBSAG, HEPBSAB, HEPBIGM    Microbiology:  Recent Results (from the past 240 hour(s))  SARS Coronavirus 2 (CEPHEID - Performed in [place] hospital lab), Hosp Order     Status: None   Collection Time: 08/25/18  9:13 AM  Result Value Ref Range Status   SARS Coronavirus 2 NEGATIVE NEGATIVE Final    Comment: (NOTE) If result is NEGATIVE SARS-CoV-2 target nucleic acids are NOT DETECTED. The SARS-CoV-2 RNA is generally detectable in upper and lower  respiratory specimens during the acute phase of infection. The lowest  concentration of SARS-CoV-2  viral copies this assay can detect is 250  copies / mL. A negative result does not preclude SARS-CoV-2 infection  and should not be used as the sole basis for treatment or other  patient management decisions.  A negative result may occur with  improper specimen collection / handling, submission of specimen other  than nasopharyngeal swab, presence of viral mutation(s) within the  areas targeted by this assay, and inadequate number of viral copies  (<250 copies / mL). A negative result must be combined with clinical  observations, patient history, and epidemiological information. If result is POSITIVE SARS-CoV-2 target nucleic acids are DETECTED. The SARS-CoV-2 RNA is generally detectable in upper and lower  respiratory specimens dur ing the acute phase of infection.  Positive  results are indicative of active infection with SARS-CoV-2.  Clinical  correlation with patient history and other diagnostic information is  necessary to determine patient infection status.  Positive results do  not rule out bacterial infection or co-infection with other viruses. If result is PRESUMPTIVE POSTIVE SARS-CoV-2 nucleic acids MAY BE PRESENT.   A presumptive positive result was obtained on the submitted  specimen  and confirmed on repeat testing.  While 2019 novel coronavirus  (SARS-CoV-2) nucleic acids may be present in the submitted sample  additional confirmatory testing may be necessary for epidemiological  and / or clinical management purposes  to differentiate between  SARS-CoV-2 and other Sarbecovirus currently known to infect humans.  If clinically indicated additional testing with an alternate test  methodology [phone]) is advised. The SARS-CoV-2 RNA is generally  detectable in upper and lower respiratory sp ecimens during the acute  phase of infection. The expected result is Negative. Fact Sheet for Patients:  StrictlyIdeas.no Fact Sheet for Healthcare Providers: BankingDealers.co.za This test is not yet approved or cleared by the [place] FDA and has been authorized for detection and/or diagnosis of SARS-CoV-2 by FDA under an Emergency Use Authorization (EUA).  This EUA will remain in effect (meaning this test can be used) for the duration of the COVID-19 declaration under Section 564(b)(1) of the Act, 21 U.S.C. section 360bbb-3(b)(1), unless the authorization is terminated or revoked sooner. Performed at [facility], [address] [place], [place] [address]   MRSA PCR Screening     Status: Abnormal   Collection Time: 08/25/18 12:37 PM  Result Value Ref Range Status   MRSA by PCR (A) NEGATIVE Final    INVALID, UNABLE TO DETERMINE THE PRESENCE OF TARGET DNA DUE TO SPECIMEN INTEGRITY. RECOLLECTION REQUESTED.    Comment: RESULT CALLED TO, READ BACK BY AND VERIFIED WITH: CALLED TO TAYLOR _0  08/25/2018 [facility] Performed at [facility], [address] [place], [place] [address]   C difficile quick scan w PCR reflex     Status: None   Collection Time: 08/25/18  2:28 PM  Result Value Ref Range Status   C Diff antigen NEGATIVE NEGATIVE Final   C Diff toxin NEGATIVE NEGATIVE Final   C Diff  interpretation No C. difficile detected.  Final    Comment: Performed at [facility], [address] [place], [place] [address]  Culture, Urine     Status: Abnormal   Collection Time: 08/26/18  2:51 AM  Result Value Ref Range Status   Specimen Description   Final    URINE, RANDOM Performed at [facility], [address] [place], [place] [address]    Special Requests   Final    Normal Performed at [facility],  [place], [place] [address]    Culture >=100,000 COLONIES/mL ESCHERICHIA COLI (A)  Final   Report Status 08/28/2018 FINAL  Final   Organism ID, Bacteria ESCHERICHIA COLI (A)  Final      Susceptibility   Escherichia coli - MIC*    AMPICILLIN <=2 SENSITIVE Sensitive     CEFAZOLIN <=4 SENSITIVE Sensitive     CEFTRIAXONE <=1 SENSITIVE Sensitive     CIPROFLOXACIN <=0.25 SENSITIVE Sensitive     GENTAMICIN <=1 SENSITIVE Sensitive     IMIPENEM <=0.25 SENSITIVE Sensitive     NITROFURANTOIN <=16 SENSITIVE Sensitive     TRIMETH/SULFA <=20 SENSITIVE Sensitive     AMPICILLIN/SULBACTAM <=2 SENSITIVE Sensitive     PIP/TAZO <=4 SENSITIVE Sensitive     Extended ESBL NEGATIVE Sensitive     * >=100,000 COLONIES/mL ESCHERICHIA COLI  Culture, respiratory (non-expectorated)     Status: None   Collection Time: 08/26/18  5:27 AM  Result Value Ref Range Status   Specimen Description   Final    TRACHEAL ASPIRATE Performed at [facility], [address] [place], [place] [address]    Special Requests   Final    Normal Performed at [facility], [address] [place], [place] [address]    Gram Stain   Final    NO WBC SEEN RARE GRAM POSITIVE COCCI IN PAIRS RARE GRAM POSITIVE RODS    Culture   Final    MODERATE MORAXELLA CATARRHALIS(BRANHAMELLA) BETA LACTAMASE NEGATIVE Performed at [facility], [place] [address] [place], [place] [address]    Report Status 08/28/2018 FINAL  Final  CULTURE, BLOOD (ROUTINE X 2) w  Reflex to ID Panel     Status: None   Collection Time: 08/26/18  5:59 AM  Result Value Ref Range Status   Specimen Description BLOOD RIGHT HAND  Final   Special Requests   Final    BOTTLES DRAWN AEROBIC AND ANAEROBIC Blood Culture adequate volume   Culture   Final    NO GROWTH 5 DAYS Performed at [facility], [address] [place], [place] [address]    Report Status 08/31/2018 FINAL  Final  CULTURE, BLOOD (ROUTINE X 2) w Reflex to ID Panel     Status: None   Collection Time: 08/26/18  6:11 AM  Result Value Ref Range Status   Specimen Description BLOOD BLOOD LEFT WRIST  Final   Special Requests   Final    BOTTLES DRAWN AEROBIC AND ANAEROBIC Blood Culture results may not be optimal due to an inadequate volume of blood received in culture bottles   Culture   Final    NO GROWTH 5 DAYS Performed at [facility], [address] [place], [place] [address]    Report Status 08/31/2018 FINAL  Final  MRSA PCR Screening     Status: None   Collection Time: 08/26/18  9:31 AM  Result Value Ref Range Status   MRSA by PCR NEGATIVE NEGATIVE Final    Comment:        The GeneXpert MRSA Assay (FDA approved for NASAL specimens only), is one component of a comprehensive MRSA colonization surveillance program. It is not intended to diagnose MRSA infection nor to guide or monitor treatment for MRSA infections. Performed at [facility] [address] [place], [place] [address]     Coagulation Studies: No results for input(s): LABPROT, INR in the last 72 hours.  Urinalysis: No results for input(s): COLORURINE, LABSPEC, [place], [place], [place],  BILIRUBINUR, KETONESUR, PROTEINUR, UROBILINOGEN, NITRITE, LEUKOCYTESUR in the last 72 hours.  Invalid input(s): APPERANCEUR    Imaging: No results found.   Medications:   . sodium chloride Stopped (08/28/18 0148)   . amoxicillin-clavulanate  1 tablet Oral Q12H  . carvedilol  3.125 mg Oral BID WC  .  citalopram  10 mg Oral Daily  . heparin  5,000 Units Subcutaneous Q8H  . mouth rinse  15 mL Mouth Rinse BID  . sodium chloride flush  3 mL Intravenous Q12H     Assessment/ Plan:  78 y.o. Caucasian male  with past medical history hypertension, obesity, lower extremity edema, chronic systolic heart failure with ejection fraction of 40%, obstructive sleep apnea, gout, and depression  1. Severe Hypokalemia 2. AKI on CKD st 3. Baseline Cr 1.50/GFR 45 on 08/17/2018 3. Hypomagnesemia 4. Acute resp failure, post outpatient cardiac arrest 5. LE Edema 6.  UTI, E. coli  Plan: Creatinine down slightly to 5.1 with an EGFR of 10.  Urine output was 675 cc over the preceding 24 hours.  Given the fact that renal function is improving we will hold off on dialysis treatments for now.  However this is not excluded during the admission at the moment.  Overall however he appears to be improving given recent events.  Continue to monitor renal function and serum electrolytes on a daily basis.  We will continue to monitor closely along with you.   LOS: 7 Ravon Mortellaro 5/19/20201:33 PM  [facility], [place]  Note: This note was prepared with Dragon dictation. Any transcription errors are unintentional

## 2018-09-01 NOTE — Progress Notes (Signed)
Physical Therapy Treatment Patient Details Name: Gary Harmon MRN: [id number] DOB: Sep 30, 1940 Today's Date: 09/01/2018    History of Present Illness Patient is a pleasant 78 year old male admitted for CHF s/p cardiac arrest/collapsing at [employer] when shopping. Per previous documentation patient required EMS to proivde 4 shocks an was intubated upon admission. Patient is now extubated and on 2L of oxygen via nasal cannula. PMH includes CHF, CKD stage III. While hospitalized patient experienced one fall (5/14-5/15?) per documentation due to confusion. Patient is now oriented.     PT Comments    Pt able to ambulate around nursing loop with RW CGA, take sitting rest break in recliner, and then walk around nursing loop again with RW SBA.  Overall pt steady ambulating with RW although requiring cueing at times for positioning within walker.  HR up to 106 bpm with ambulation and O2 sats 92% or greater on room air during session.  Pt denying any pain.  Will continue to progress pt with strengthening, balance, and progressive functional mobility during hospital stay.    Follow Up Recommendations  Home health PT     Equipment Recommendations  Rolling walker with 5" wheels;Other (comment)(shower chair)    Recommendations for Other Services       Precautions / Restrictions Precautions Precautions: Fall Restrictions Weight Bearing Restrictions: No    Mobility  Bed Mobility               General bed mobility comments: Deferred (pt up in chair beginning/end of session)  Transfers Overall transfer level: Needs assistance Equipment used: Rolling walker (2 wheeled) Transfers: Sit to/from Stand Sit to Stand: Supervision         General transfer comment: vc's for proper hand placement with transfers; strong stand noted  Ambulation/Gait Ambulation/Gait assistance: Min guard;Supervision Gait Distance (Feet): (260 feet x2) Assistive device: Rolling walker (2 wheeled)   Gait velocity:  decreased but occasionally increased   General Gait Details: R LE appearing more "stiff" with ambulation with mild decreased R LE stance time; narrow BOS; intermittent vc's for appropriate positioning within walker   Stairs             Wheelchair Mobility    Modified Rankin (Stroke Patients Only)       Balance Overall balance assessment: Needs assistance Sitting-balance support: No upper extremity supported;Feet supported Sitting balance-Leahy Scale: Normal Sitting balance - Comments: steady sitting reaching outside BOS   Standing balance support: No upper extremity supported Standing balance-Leahy Scale: Good Standing balance comment: steady standing reaching within BOS                            Cognition Arousal/Alertness: Awake/alert Behavior During Therapy: WFL for tasks assessed/performed Overall Cognitive Status: Within Functional Limits for tasks assessed                                        Exercises      General Comments   Nursing cleared pt for participation in physical therapy.  Pt agreeable to PT session.      Pertinent Vitals/Pain Pain Assessment: No/denies pain    Home Living                      Prior Function            PT Goals (current goals can  now be found in the care plan section) Acute Rehab PT Goals Patient Stated Goal: to return home and play golf again  PT Goal Formulation: With patient Time For Goal Achievement: 09/13/18 Potential to Achieve Goals: Fair Progress towards PT goals: Progressing toward goals    Frequency    Min 2X/week      PT Plan Current plan remains appropriate    Co-evaluation              AM-PAC PT "6 Clicks" Mobility   Outcome Measure  Help needed turning from your back to your side while in a flat bed without using bedrails?: None Help needed moving from lying on your back to sitting on the side of a flat bed without using bedrails?: None Help  needed moving to and from a bed to a chair (including a wheelchair)?: A Little Help needed standing up from a chair using your arms (e.g., wheelchair or bedside chair)?: A Little Help needed to walk in hospital room?: A Little Help needed climbing 3-5 steps with a railing? : A Little 6 Click Score: 20    End of Session Equipment Utilized During Treatment: Gait belt Activity Tolerance: Patient tolerated treatment well Patient left: in chair;with call bell/phone within reach;with chair alarm set Nurse Communication: Mobility status;Precautions PT Visit Diagnosis: Unsteadiness on feet (R26.81);Other abnormalities of gait and mobility (R26.89);Muscle weakness (generalized) (M62.81);Difficulty in walking, not elsewhere classified (R26.2)     Time: 5176-1607 PT Time Calculation (min) (ACUTE ONLY): 30 min  Charges:  $Gait Training: 8-22 mins $Therapeutic Exercise: 8-22 mins                    Leitha Bleak, PT 09/01/18, 4:52 PM [phone]

## 2018-09-01 NOTE — Progress Notes (Signed)
[place] [facility] [place] NAME: Gary Harmon    MR#:  [id number]  DATE OF BIRTH:  1940/11/21  SUBJECTIVE:   Creatinine is improved today, patient is urinating well with not had 950 cc of urine in the past 24 hrs.  Pt. Has no other complaints.   REVIEW OF SYSTEMS:   Review of Systems  Constitutional: Negative for chills, fever and weight loss.  HENT: Negative for ear discharge, ear pain and nosebleeds.   Eyes: Negative for blurred vision, pain and discharge.  Respiratory: Negative for cough, sputum production, shortness of breath, wheezing and stridor.   Cardiovascular: Negative for chest pain, palpitations, orthopnea and PND.  Gastrointestinal: Negative for abdominal pain, diarrhea, nausea and vomiting.  Genitourinary: Negative for frequency and urgency.  Musculoskeletal: Negative for back pain and joint pain.  Neurological: Positive for weakness (generalized). Negative for sensory change, speech change and focal weakness.  Psychiatric/Behavioral: Negative for depression and hallucinations. The patient is not nervous/anxious.    Tolerating Diet: yes Tolerating PT: Eval noted.   DRUG ALLERGIES:  No Known Allergies  VITALS:  Blood pressure (!) 151/89, pulse 95, temperature (!) 97.3 F (36.3 C), temperature source Oral, resp. rate 20, height 5\' 7"  (1.702 m), weight 90.3 kg, SpO2 97 %.  PHYSICAL EXAMINATION:   Physical Exam  GENERAL:  78 y.o.-year-old patient lying in bed in no acute distress.  EYES: Pupils equal, round, reactive to light and accommodation. No scleral icterus. Extraocular muscles intact.  HEENT: Head atraumatic, normocephalic. Oropharynx and nasopharynx clear.  NECK:  Supple, no jugular venous distention. No thyroid enlargement, no tenderness.  LUNGS: Good a/e bl, no wheezing, rales, rhonchi. No use of accessory muscles of respiration.  CARDIOVASCULAR: S1, S2 normal. No murmurs, rubs, or gallops.  ABDOMEN: Soft,  nontender, nondistended. Bowel sounds present. No organomegaly or mass.  EXTREMITIES: No cyanosis, clubbing or edema b/l.    NEUROLOGIC: Cranial nerves II through XII are intact. No focal Motor or sensory deficits b/l. PSYCHIATRIC:  patient is alert and oriented x 3.  SKIN: No obvious rash, lesion, or ulcer.   LABORATORY PANEL:  CBC Recent Labs  Lab 09/01/18 0422  WBC 10.3  HGB 9.9*  HCT 31.0*  PLT 247    Chemistries  Recent Labs  Lab 08/27/18 0449  08/28/18 0655  09/01/18 0422  NA 137  --  137   < > 141  K 3.6   < > 4.3   < > 4.0  CL 97*  --  100   < > 106  CO2 23  --  21*   < > 23  GLUCOSE 91  --  113*   < > 98  BUN 40*  --  52*   < > 60*  CREATININE 3.75*  --  4.98*   < > 5.13*  CALCIUM 8.1*  --  8.1*   < > 8.8*  MG 2.4  --   --   --   --   AST 925*  --  354*  --   --   ALT 973*  --  708*  --   --   ALKPHOS 65  --  71  --   --   BILITOT 0.6  --  0.7  --   --    < > = values in this interval not displayed.   Cardiac Enzymes Recent Labs  Lab 08/28/18 0655  TROPONINI 2.74*   RADIOLOGY:  No results found. ASSESSMENT  AND PLAN:  Gary Harmon  is a 78 y.o. male with a known history of congestive heart failure systolic, chronic kidney disease stage III, hypertension, gout comes to the emergency room after he had a cardiac arrest at [employer] while shopping. According to the wife and ER records patient was having a routine day and while at checkout per wife he all of a sudden collapsed.   1. Cardiorespiratory arrest. - much improved and now extubated.  - suspected due to severe electrolyte abnormality and currently stable.   2. Severe electrolyte abnormality with hypokalemia and severe hypomagnesimia - Improved with supplementation and currently normal. - Patient was on Lokelma as an outpatient due to worsening hyperkalemia due to Aldactone.  Now much improved.  We will continue to monitor serial electrolytes.  3. Acute on Chronic kidney disease stage IV with ATN  Due to  cardiorespiratory arrest  -baseline creatinine around 1.5--2.6--3.75--4.98--5.5--5.9--5.1 today - Urine output is fairly good with 950 cc/24 hrs.  Cont. To monitor.  -Continue to follow BUN/creatinine and urine output.  Renal dose meds, avoid nephrotoxins. - nephrology following.  4. Elevated troponin in the setting of cardiac arrest and ATN - Dr. Bethanne Ginger input appreciated--no further cardiac diagnostics at present. NO CP and pt. Is asymptomatic.   5.elevated transaminases -appears shock liver with Cardiac arrest - normalized.  6. UTI/Aspiration pneumonia - finished treatment with Augmentin.  - urine culture + for 100,000 colonies of E. coli which is pansensitive. - Respiratory culture from 3 days ago currently remains negative.  Seen by physical therapy and likely discharge home with home health services in next 1 to 2 days if renal function continues to improve. Discussed plan of care with Nephrology.   CODE STATUS: full  DVT Prophylaxis: heparin  TOTAL TIME TAKING CARE OF THIS PATIENT: *30* minutes.   POSSIBLE D/C IN 1-2 days , DEPENDING ON CLINICAL CONDITION.  Note: This dictation was prepared with Dragon dictation along with smaller phrase technology. Any transcriptional errors that result from this process are unintentional.  Gary Harmon M.D on 09/01/2018 at 2:19 PM  Between 7am to 6pm - Pager - [phone]  After 6pm go to [email or web address] - password [email or web address] [facility]  Office  [phone]  CC: Primary care physician; Gary Harmon, MDPatient ID: Gary Harmon, male   DOB: February 28, 1941, 78 y.o.   MRN: [id number]

## 2018-09-02 LAB — RENAL FUNCTION PANEL
Albumin: 2.9 g/dL — ABNORMAL LOW (ref 3.5–5.0)
Anion gap: 12 (ref 5–15)
BUN: 56 mg/dL — ABNORMAL HIGH (ref 8–23)
CO2: 24 mmol/L (ref 22–32)
Calcium: 8.6 mg/dL — ABNORMAL LOW (ref 8.9–10.3)
Chloride: 104 mmol/L (ref 98–111)
Creatinine, Ser: 4.51 mg/dL — ABNORMAL HIGH (ref 0.61–1.24)
GFR calc Af Amer: 14 mL/min — ABNORMAL LOW (ref >60)
GFR calc non Af Amer: 12 mL/min — ABNORMAL LOW (ref >60)
Glucose, Bld: 103 mg/dL — ABNORMAL HIGH (ref 70–99)
Phosphorus: 3.8 mg/dL (ref 2.5–4.6)
Potassium: 3.8 mmol/L (ref 3.5–5.1)
Sodium: 140 mmol/L (ref 135–145)

## 2018-09-02 NOTE — Progress Notes (Signed)
[facility] NAME: Gary Harmon    MR#:  [id number]  DATE OF BIRTH:  [birth year]  SUBJECTIVE:   Cr. Continues to trend down.  Pt. Made over 1100 cc of urine overnight.  No Complaints presently and he feels overall better.    REVIEW OF SYSTEMS:   Review of Systems  Constitutional: Negative for chills, fever and weight loss.  HENT: Negative for ear discharge, ear pain and nosebleeds.   Eyes: Negative for blurred vision, pain and discharge.  Respiratory: Negative for cough, sputum production, shortness of breath, wheezing and stridor.   Cardiovascular: Negative for chest pain, palpitations, orthopnea and PND.  Gastrointestinal: Negative for abdominal pain, diarrhea, nausea and vomiting.  Genitourinary: Negative for frequency and urgency.  Musculoskeletal: Negative for back pain and joint pain.  Neurological: Negative for sensory change, speech change, focal weakness and weakness.  Psychiatric/Behavioral: Negative for depression and hallucinations. The patient is not nervous/anxious.    Tolerating Diet: yes Tolerating PT: Eval noted.   DRUG ALLERGIES:  No Known Allergies  VITALS:  Blood pressure (!) 144/79, pulse (!) 105, temperature 97.8 F (36.6 C), temperature source Oral, resp. rate 20, height 5\' 7"  (1.702 m), weight 90.3 kg, SpO2 97 %.  PHYSICAL EXAMINATION:   Physical Exam  GENERAL:  78 y.o.-year-old patient lying in bed in no acute distress.  EYES: Pupils equal, round, reactive to light and accommodation. No scleral icterus. Extraocular muscles intact.  HEENT: Head atraumatic, normocephalic. Oropharynx and nasopharynx clear.  NECK:  Supple, no jugular venous distention. No thyroid enlargement, no tenderness.  LUNGS: Good a/e bl, no wheezing, rales, rhonchi. No use of accessory muscles of respiration.  CARDIOVASCULAR: S1, S2 normal. No murmurs, rubs, or gallops.  ABDOMEN: Soft, nontender, nondistended. Bowel sounds  present. No organomegaly or mass.  EXTREMITIES: No cyanosis, clubbing or edema b/l.    NEUROLOGIC: Cranial nerves II through XII are intact. No focal Motor or sensory deficits b/l. PSYCHIATRIC:  patient is alert and oriented x 3.  SKIN: No obvious rash, lesion, or ulcer.   LABORATORY PANEL:  CBC Recent Labs  Lab 09/01/18 0422  WBC 10.3  HGB 9.9*  HCT 31.0*  PLT 247    Chemistries  Recent Labs  Lab 08/27/18 0449  08/28/18 0655  09/02/18 0307  NA 137  --  137   < > 140  K 3.6   < > 4.3   < > 3.8  CL 97*  --  100   < > 104  CO2 23  --  21*   < > 24  GLUCOSE 91  --  113*   < > 103*  BUN 40*  --  52*   < > 56*  CREATININE 3.75*  --  4.98*   < > 4.51*  CALCIUM 8.1*  --  8.1*   < > 8.6*  MG 2.4  --   --   --   --   AST 925*  --  354*  --   --   ALT 973*  --  708*  --   --   ALKPHOS 65  --  71  --   --   BILITOT 0.6  --  0.7  --   --    < > = values in this interval not displayed.   Cardiac Enzymes Recent Labs  Lab 08/28/18 0655  TROPONINI 2.74*   RADIOLOGY:  No results found. ASSESSMENT AND PLAN:  Gary Harmon  is a 78 y.o. male with a known history of congestive heart failure systolic, chronic kidney disease stage III, hypertension, gout comes to the emergency room after he had a cardiac arrest at [employer] while shopping. According to the wife and ER records patient was having a routine day and while at checkout per wife he all of a sudden collapsed.   1. Cardiorespiratory arrest. - much improved and now extubated.  - suspected due to severe electrolyte abnormality and currently stable.   2. Severe electrolyte abnormality with hypokalemia and severe hypomagnesimia - Improved with supplementation and now normalized.  - Patient was on Lokelma as an outpatient due to worsening hyperkalemia due to Aldactone.  Now much improved.  We will continue to monitor serial electrolytes.  3. Acute on Chronic kidney disease stage IV with ATN  Due to cardiorespiratory arrest  -baseline  creatinine around 1.5--2.6--3.75--4.98--5.5--5.9--5.1--4.5 today - Urine output is fairly good with 1100 cc/24 hrs.  Cont. To monitor.  -Continue to follow BUN/creatinine and urine output.  Renal dose meds, avoid nephrotoxins and Nephro following.   4. Elevated troponin in the setting of cardiac arrest and ATN - Dr. Bethanne Ginger input appreciated--no further cardiac diagnostics at present. NO CP and pt. Is asymptomatic.   5.elevated transaminases -appears shock liver with Cardiac arrest - normalized.  6. UTI/Aspiration pneumonia - on Augmentin and will finish treatment tomorrow a.m.   - urine culture + for 100,000 colonies of E. coli which is pansensitive.  Discussed w/ Nephrology and will d/c home tomorrow if Cr. Trends down and pt. Is urinating well.   CODE STATUS: full  DVT Prophylaxis: heparin  TOTAL TIME TAKING CARE OF THIS PATIENT: *30* minutes.   POSSIBLE D/C IN 1-2 days , DEPENDING ON CLINICAL CONDITION.  Note: This dictation was prepared with Dragon dictation along with smaller phrase technology. Any transcriptional errors that result from this process are unintentional.  [name] on 09/02/2018 at 2:04 PM  Between 7am to 6pm - Pager - [phone]  After 6pm go to [email or web address] - password [email or web address] [facility]  Office  [phone]  CC: Primary care physician; Valera Castle, MDPatient ID: Gary Harmon, male   DOB: September 17, 1940, 78 y.o.   MRN: [id number]

## 2018-09-02 NOTE — Progress Notes (Signed)
[facility], [place] 09/02/18  Subjective:  Patient appears to be improving. Creatinine down to 4.5 and urine output was 1.1 L over the preceding 24 hours.    Objective:  Vital signs in last 24 hours:  Temp:  [97.5 F (36.4 C)-97.8 F (36.6 C)] 97.8 F (36.6 C) (05/20 0840) Pulse Rate:  [91-105] 105 (05/20 0840) Resp:  [19-20] 20 (05/20 0840) BP: (126-153)/(79-95) 144/79 (05/20 0840) SpO2:  [94 %-98 %] 97 % (05/20 0840) Weight:  [89.3 kg-90.3 kg] 90.3 kg (05/20 0425)  Weight change: -0.998 kg Filed Weights   09/01/18 0433 09/01/18 2300 09/02/18 0425  Weight: 90.3 kg 89.3 kg 90.3 kg    Intake/Output:    Intake/Output Summary (Last 24 hours) at 09/02/2018 1206 Last data filed at 09/02/2018 1044 Gross per 24 hour  Intake 240 ml  Output 1155 ml  Net -915 ml     Physical Exam: General: No acute distress  HEENT Moist oral mucus membranes, hearing intact  Neck supple  Pulm/lungs scattered rhonchi, normal effort  CVS/Heart Regular  Abdomen:  Soft. NTND  Extremities: + pitting edema  Neurologic: Alert, able to follow commands appropriately  Skin: No acute rashes          Basic Metabolic Panel:  Recent Labs  Lab 08/27/18 0449  08/29/18 0929 08/30/18 0450 08/31/18 0530 09/01/18 0422 09/02/18 0307  NA 137   < > 140 141 140 141 140  K 3.6   < > 4.2 4.0 3.7 4.0 3.8  CL 97*   < > 105 103 102 106 104  CO2 23   < > 24 26 24 23 24   GLUCOSE 91   < > 99 104* 88 98 103*  BUN 40*   < > 58* 60* 62* 60* 56*  CREATININE 3.75*   < > 5.58* 5.52* 5.99* 5.13* 4.51*  CALCIUM 8.1*   < > 8.4* 8.4* 8.3* 8.8* 8.6*  MG 2.4  --   --   --   --   --   --   PHOS  --   --  3.7 3.7 3.8 3.9 3.8   < > = values in this interval not displayed.     CBC: Recent Labs  Lab 08/27/18 0449 08/28/18 0655 09/01/18 0422  WBC 10.4 9.2 10.3  NEUTROABS  --  7.4  --   HGB 10.1* 10.2* 9.9*  HCT 31.7* 31.6* 31.0*  MCV 95.2 92.9 96.3  PLT 230 251 247     No  results found for: HEPBSAG, HEPBSAB, HEPBIGM    Microbiology:  Recent Results (from the past 240 hour(s))  SARS Coronavirus 2 (CEPHEID - Performed in [facility] hospital lab), Hosp Order     Status: None   Collection Time: 08/25/18  9:13 AM  Result Value Ref Range Status   SARS Coronavirus 2 NEGATIVE NEGATIVE Final    Comment: (NOTE) If result is NEGATIVE SARS-CoV-2 target nucleic acids are NOT DETECTED. The SARS-CoV-2 RNA is generally detectable in upper and lower  respiratory specimens during the acute phase of infection. The lowest  concentration of SARS-CoV-2 viral copies this assay can detect is 250  copies / mL. A negative result does not preclude SARS-CoV-2 infection  and should not be used as the sole basis for treatment or other  patient management decisions.  A negative result may occur with  improper specimen collection / handling, submission of specimen other  than nasopharyngeal swab, presence of viral mutation(s) within the  areas  targeted by this assay, and inadequate number of viral copies  (<250 copies / mL). A negative result must be combined with clinical  observations, patient history, and epidemiological information. If result is POSITIVE SARS-CoV-2 target nucleic acids are DETECTED. The SARS-CoV-2 RNA is generally detectable in upper and lower  respiratory specimens dur ing the acute phase of infection.  Positive  results are indicative of active infection with SARS-CoV-2.  Clinical  correlation with patient history and other diagnostic information is  necessary to determine patient infection status.  Positive results do  not rule out bacterial infection or co-infection with other viruses. If result is PRESUMPTIVE POSTIVE SARS-CoV-2 nucleic acids MAY BE PRESENT.   A presumptive positive result was obtained on the submitted specimen  and confirmed on repeat testing.  While 2019 novel coronavirus  (SARS-CoV-2) nucleic acids may be present in the submitted  sample  additional confirmatory testing may be necessary for epidemiological  and / or clinical management purposes  to differentiate between  SARS-CoV-2 and other Sarbecovirus currently known to infect humans.  If clinically indicated additional testing with an alternate test  methodology [id number]) is advised. The SARS-CoV-2 RNA is generally  detectable in upper and lower respiratory sp ecimens during the acute  phase of infection. The expected result is Negative. Fact Sheet for Patients:  StrictlyIdeas.no Fact Sheet for Healthcare Providers: BankingDealers.co.za This test is not yet approved or cleared by the [place] FDA and has been authorized for detection and/or diagnosis of SARS-CoV-2 by FDA under an Emergency Use Authorization (EUA).  This EUA will remain in effect (meaning this test can be used) for the duration of the COVID-19 declaration under Section 564(b)(1) of the Act, 21 U.S.C. section 360bbb-3(b)(1), unless the authorization is terminated or revoked sooner. Performed at [facility], [address] [place], [place] [address]   MRSA PCR Screening     Status: Abnormal   Collection Time: 08/25/18 12:37 PM  Result Value Ref Range Status   MRSA by PCR (A) NEGATIVE Final    INVALID, UNABLE TO DETERMINE THE PRESENCE OF TARGET DNA DUE TO SPECIMEN INTEGRITY. RECOLLECTION REQUESTED.    Comment: RESULT CALLED TO, READ BACK BY AND VERIFIED WITH: CALLED TO TAYLOR @1518  08/25/2018 [facility] Performed at [facility], [address] [place], [place] [address]   C difficile quick scan w PCR reflex     Status: None   Collection Time: 08/25/18  2:28 PM  Result Value Ref Range Status   C Diff antigen NEGATIVE NEGATIVE Final   C Diff toxin NEGATIVE NEGATIVE Final   C Diff interpretation No C. difficile detected.  Final    Comment: Performed at [facility], [address] [place], [place] [address]   Culture, Urine     Status: Abnormal   Collection Time: 08/26/18  2:51 AM  Result Value Ref Range Status   Specimen Description   Final    URINE, RANDOM Performed at [facility], [address], [place], [place] [address]    Special Requests   Final    Normal Performed at [facility], [address] [place], [place] [address]    Culture >=100,000 COLONIES/mL ESCHERICHIA COLI (A)  Final   Report Status 08/28/2018 FINAL  Final   Organism ID, Bacteria ESCHERICHIA COLI (A)  Final      Susceptibility   Escherichia coli - MIC*    AMPICILLIN <=2 SENSITIVE Sensitive     CEFAZOLIN <=4 SENSITIVE Sensitive  CEFTRIAXONE <=1 SENSITIVE Sensitive     CIPROFLOXACIN <=0.25 SENSITIVE Sensitive     GENTAMICIN <=1 SENSITIVE Sensitive     IMIPENEM <=0.25 SENSITIVE Sensitive     NITROFURANTOIN <=16 SENSITIVE Sensitive     TRIMETH/SULFA <=20 SENSITIVE Sensitive     AMPICILLIN/SULBACTAM <=2 SENSITIVE Sensitive     PIP/TAZO <=4 SENSITIVE Sensitive     Extended ESBL NEGATIVE Sensitive     * >=100,000 COLONIES/mL ESCHERICHIA COLI  Culture, respiratory (non-expectorated)     Status: None   Collection Time: 08/26/18  5:27 AM  Result Value Ref Range Status   Specimen Description   Final    TRACHEAL ASPIRATE Performed at [facility], [address], [place], [place] [address]    Special Requests   Final    Normal Performed at [address] [place], [place] [address]    Gram Stain   Final    NO WBC SEEN RARE GRAM POSITIVE COCCI IN PAIRS RARE GRAM POSITIVE RODS    Culture   Final    MODERATE MORAXELLA CATARRHALIS(BRANHAMELLA) BETA LACTAMASE NEGATIVE Performed at [facility], [place] [address] [place], [place] [address]    Report Status 08/28/2018 FINAL  Final  CULTURE, BLOOD (ROUTINE X 2) w Reflex to ID Panel     Status: None   Collection Time: 08/26/18  5:59 AM  Result Value Ref Range Status   Specimen Description BLOOD RIGHT  HAND  Final   Special Requests   Final    BOTTLES DRAWN AEROBIC AND ANAEROBIC Blood Culture adequate volume   Culture   Final    NO GROWTH 5 DAYS Performed at [facility], [address] [place], [place] [address]    Report Status 08/31/2018 FINAL  Final  CULTURE, BLOOD (ROUTINE X 2) w Reflex to ID Panel     Status: None   Collection Time: 08/26/18  6:11 AM  Result Value Ref Range Status   Specimen Description BLOOD BLOOD LEFT WRIST  Final   Special Requests   Final    BOTTLES DRAWN AEROBIC AND ANAEROBIC Blood Culture results may not be optimal due to an inadequate volume of blood received in culture bottles   Culture   Final    NO GROWTH 5 DAYS Performed at [facility], [address] [place], [place] [address]    Report Status 08/31/2018 FINAL  Final  MRSA PCR Screening     Status: None   Collection Time: 08/26/18  9:31 AM  Result Value Ref Range Status   MRSA by PCR NEGATIVE NEGATIVE Final    Comment:        The GeneXpert MRSA Assay (FDA approved for NASAL specimens only), is one component of a comprehensive MRSA colonization surveillance program. It is not intended to diagnose MRSA infection nor to guide or monitor treatment for MRSA infections. Performed at [facility], [address] [place], [place] [address]     Coagulation Studies: No results for input(s): LABPROT, INR in the last 72 hours.  Urinalysis: No results for input(s): COLORURINE, LABSPEC, PHURINE, GLUCOSEU, HGBUR, BILIRUBINUR, KETONESUR, PROTEINUR, UROBILINOGEN, NITRITE, LEUKOCYTESUR in the last 72 hours.  Invalid input(s): APPERANCEUR    Imaging: No results found.   Medications:   . sodium chloride Stopped (08/28/18 0148)   . amoxicillin-clavulanate  1 tablet Oral Q12H  . carvedilol  3.125 mg Oral BID WC  . citalopram  10 mg Oral Daily  . heparin  5,000 Units Subcutaneous Q8H  .  mouth rinse  15 mL Mouth Rinse BID  . sodium chloride flush  3 mL  Intravenous Q12H     Assessment/ Plan:  78 y.o. Caucasian male  with past medical history hypertension, obesity, lower extremity edema, chronic systolic heart failure with ejection fraction of 40%, obstructive sleep apnea, gout, and depression  1. Severe Hypokalemia 2. AKI on CKD st 3. Baseline Cr 1.50/GFR 45 on 08/17/2018 3. Hypomagnesemia 4. Acute resp failure, post outpatient cardiac arrest 5. LE Edema 6.  UTI, E. coli  Plan: Renal function has improved significantly over the preceding 24 hours.  Creatinine down to 4.5.  Urine output was 1.1 L over the preceding 24 hours.  Recommend continued observation of urine output and renal function over the next 24 hours but suspect that he may be able to be discharged tomorrow.   LOS: 8 Alexandru Moorer 5/20/202012:05 PM  [place]  Note: This note was prepared with Dragon dictation. Any transcription errors are unintentional

## 2018-09-02 NOTE — Progress Notes (Signed)
Physical Therapy Treatment Patient Details Name: Login Muckleroy MRN: [id number] DOB: 08/07/1940 Today's Date: 09/02/2018    History of Present Illness Patient is a pleasant 78 year old male admitted for CHF s/p cardiac arrest/collapsing at [employer] when shopping. Per previous documentation patient required EMS to proivde 4 shocks an was intubated upon admission. Patient is now extubated and on room air. PMH includes CHF, CKD stage III. While hospitalized patient experienced one fall (5/14-5/15?) per documentation due to confusion. Patient is now oriented.     PT Comments    Pt pleasant and talkative during session; motivated to walk.  Pt steady ambulating with RW around nursing loop (pt requesting to use walker for balance).  Able to navigate 6 steps with B railings safely.  HR 115 bpm at most with activity (98 bpm at rest) and O2 sats 92% or greater on room air.  Will continue to focus on strengthening, balance, and progressive ambulation with least restrictive assistive device during hospital stay.    Follow Up Recommendations  Home health PT     Equipment Recommendations  Rolling walker with 5" wheels;Other (comment)(shower chair)    Recommendations for Other Services       Precautions / Restrictions Precautions Precautions: Fall Restrictions Weight Bearing Restrictions: No    Mobility  Bed Mobility               General bed mobility comments: Deferred (pt up in chair beginning/end of session)  Transfers Overall transfer level: Modified independent Equipment used: Rolling walker (2 wheeled) Transfers: Sit to/from Stand Sit to Stand: Modified independent (Device/Increase time)         General transfer comment: steady strong transfers noted  Ambulation/Gait Ambulation/Gait assistance: Supervision Gait Distance (Feet): (260 feet; 180 feet; 260 feet)     Gait velocity: mildly decreased but at times mildly increased   General Gait Details: R LE appearing slightly more  "stiff" with ambulation with mild decreased R LE stance time; narrow BOS; steady with RW use; occasional cues to stay closer to RW   Stairs Stairs: Yes Stairs assistance: Min guard Stair Management: Two rails;Alternating pattern;Forwards Number of Stairs: 6 General stair comments: steady strong stairs navigation noted   Wheelchair Mobility    Modified Rankin (Stroke Patients Only)       Balance Overall balance assessment: Needs assistance Sitting-balance support: No upper extremity supported;Feet supported Sitting balance-Leahy Scale: Normal Sitting balance - Comments: steady sitting reaching outside BOS   Standing balance support: No upper extremity supported Standing balance-Leahy Scale: Good Standing balance comment: steady standing washing hands at sink                            Cognition Arousal/Alertness: Awake/alert Behavior During Therapy: WFL for tasks assessed/performed Overall Cognitive Status: Within Functional Limits for tasks assessed                                 General Comments: Pleasant and eager to participate. Talkative.      Exercises      General Comments   Nursing cleared pt for participation in physical therapy.  Pt agreeable to PT session and eager to walk.      Pertinent Vitals/Pain Pain Assessment: No/denies pain    Home Living                      Prior Function  PT Goals (current goals can now be found in the care plan section) Acute Rehab PT Goals Patient Stated Goal: to return home and play golf again  PT Goal Formulation: With patient Time For Goal Achievement: 09/13/18 Potential to Achieve Goals: Good Progress towards PT goals: Progressing toward goals    Frequency    Min 2X/week      PT Plan Current plan remains appropriate    Co-evaluation              AM-PAC PT "6 Clicks" Mobility   Outcome Measure  Help needed turning from your back to your side while  in a flat bed without using bedrails?: None Help needed moving from lying on your back to sitting on the side of a flat bed without using bedrails?: None Help needed moving to and from a bed to a chair (including a wheelchair)?: None Help needed standing up from a chair using your arms (e.g., wheelchair or bedside chair)?: None Help needed to walk in hospital room?: A Little Help needed climbing 3-5 steps with a railing? : A Little 6 Click Score: 22    End of Session Equipment Utilized During Treatment: Gait belt Activity Tolerance: Patient tolerated treatment well Patient left: in chair;with call bell/phone within reach;with chair alarm set Nurse Communication: Mobility status;Precautions PT Visit Diagnosis: Unsteadiness on feet (R26.81);Other abnormalities of gait and mobility (R26.89);Muscle weakness (generalized) (M62.81);Difficulty in walking, not elsewhere classified (R26.2)     Time: 3735-7897 PT Time Calculation (min) (ACUTE ONLY): 32 min  Charges:  $Gait Training: 8-22 mins $Therapeutic Activity: 8-22 mins                    Leitha Bleak, PT 09/02/18, 4:26 PM [phone]

## 2018-09-02 NOTE — Plan of Care (Signed)
  Problem: Education: Goal: Knowledge of General Education information will improve Description Including pain rating scale, medication(s)/side effects and non-pharmacologic comfort measures Outcome: Progressing   Problem: Health Behavior/Discharge Planning: Goal: Ability to manage health-related needs will improve Outcome: Progressing   Problem: Clinical Measurements: Goal: Ability to maintain clinical measurements within normal limits will improve Outcome: Progressing Goal: Will remain free from infection Outcome: Progressing Goal: Diagnostic test results will improve Outcome: Progressing Goal: Respiratory complications will improve Outcome: Progressing Goal: Cardiovascular complication will be avoided Outcome: Progressing   Problem: Activity: Goal: Risk for activity intolerance will decrease Outcome: Progressing   Problem: Nutrition: Goal: Adequate nutrition will be maintained Outcome: Progressing   Problem: Elimination: Goal: Will not experience complications related to bowel motility Outcome: Progressing

## 2018-09-02 NOTE — Care Management Important Message (Signed)
Important Message  Patient Details  Name: Gary Harmon MRN: [id number] Date of Birth: 1940/12/04   Medicare Important Message Given:  Yes    Dannette Barbara 09/02/2018, 10:57 AM

## 2018-09-02 NOTE — Progress Notes (Signed)
Patient ambulated in hallway as well as in stairwell with PT. Received call from telemetry and was notified that pt had approx 20 seconds of ventricular bigeminy. Pt heart rate in 90's at rest. Pt denied any symptoms. Dr. Verdell Carmine was notified and will continue to monitor.

## 2018-09-03 LAB — BASIC METABOLIC PANEL
Anion gap: 12 (ref 5–15)
BUN: 46 mg/dL — ABNORMAL HIGH (ref 8–23)
CO2: 23 mmol/L (ref 22–32)
Calcium: 8.1 mg/dL — ABNORMAL LOW (ref 8.9–10.3)
Chloride: 103 mmol/L (ref 98–111)
Creatinine, Ser: 3.7 mg/dL — ABNORMAL HIGH (ref 0.61–1.24)
GFR calc Af Amer: 17 mL/min — ABNORMAL LOW (ref >60)
GFR calc non Af Amer: 15 mL/min — ABNORMAL LOW (ref >60)
Glucose, Bld: 85 mg/dL (ref 70–99)
Potassium: 4 mmol/L (ref 3.5–5.1)
Sodium: 138 mmol/L (ref 135–145)

## 2018-09-03 MED ORDER — CARVEDILOL 3.125 MG PO TABS: 3.1250 mg | ORAL_TABLET | Freq: Two times a day (BID) | ORAL | 0 refills | Status: AC

## 2018-09-03 NOTE — TOC Transition Note (Signed)
Transition of Care [facility]) - CM/SW Discharge Note   Patient Details  Name: Gary Harmon MRN: [id number] Date of Birth: 1940/09/14  [facility] CM/SW Contact:  Elza Rafter, RN Phone Number: 09/03/2018, 1:43 PM   Clinical Narrative:   Patient to DC today to home with wife.  Notified [facility] for home health PT; order has been placed.     Final next level of care: Home w Home Health Services Barriers to Discharge: No Barriers Identified   Patient Goals and CMS Choice Patient states their goals for this hospitalization and ongoing recovery are:: Get well and go home      Discharge Placement                       Discharge Plan and Services     Post Acute Care Choice: Durable Medical Equipment, Home Health          DME Arranged: Walker rolling with seat, Bedside commode                    Social Determinants of Health (SDOH) Interventions     Readmission Risk Interventions No flowsheet data found.

## 2018-09-03 NOTE — Progress Notes (Signed)
Nutrition Brief Note  Patient identified for LOS  78 y.o.malewith a known history of congestive heart failure systolic, chronic kidney disease stage III, hypertension, gout comes to the emergency room after he had a cardiac arrest   Wt Readings from Last 15 Encounters:  09/03/18 79.4 kg  07/28/18 86.2 kg  06/22/18 80.3 kg  10/13/15 96.6 kg  07/10/15 96.8 kg  09/22/14 101.2 kg    Body mass index is 27.41 kg/m. Patient meets criteria for overweight based on current BMI.   Current diet order is HH, patient is consuming approximately 100% of meals at this time. Labs and medications reviewed.   No nutrition interventions warranted at this time. If nutrition issues arise, please consult RD.   Koleen Distance MS, RD, LDN Pager #- [phone] Office#- [phone] After Hours Pager: [phone]

## 2018-09-03 NOTE — Progress Notes (Signed)
Physical Therapy Treatment Patient Details Name: Gary Harmon MRN: [id number] DOB: 1940-05-24 Today's Date: 09/03/2018    History of Present Illness Patient is a pleasant 78 year old male admitted for CHF s/p cardiac arrest/collapsing at [employer] when shopping. Per previous documentation patient required EMS to proivde 4 shocks an was intubated upon admission. Patient is now extubated and on room air. PMH includes CHF, CKD stage III. While hospitalized patient experienced one fall (5/14-5/15?) per documentation due to confusion. Patient is now oriented.     PT Comments    Pt stood and ambulated 300' with walker and min guard/supervision.  Anxious to return home.  Follow Up Recommendations  Home health PT     Equipment Recommendations  Rolling walker with 5" wheels    Recommendations for Other Services       Precautions / Restrictions Precautions Precautions: Fall Restrictions Weight Bearing Restrictions: No    Mobility  Bed Mobility               General bed mobility comments: Deferred (pt up in chair beginning/end of session)  Transfers Overall transfer level: Modified independent Equipment used: Rolling walker (2 wheeled) Transfers: Sit to/from Stand Sit to Stand: Modified independent (Device/Increase time)         General transfer comment: steady strong transfers noted  Ambulation/Gait Ambulation/Gait assistance: Supervision Gait Distance (Feet): 300 Feet Assistive device: Rolling walker (2 wheeled)       General Gait Details: R LE appearing slightly more "stiff" with ambulation with mild decreased R LE stance time; narrow BOS; steady with RW use; occasional cues to stay closer to [employer] Mobility    Modified Rankin (Stroke Patients Only)       Balance Overall balance assessment: Needs assistance Sitting-balance support: No upper extremity supported;Feet supported Sitting balance-Leahy Scale: Normal     Standing  balance support: No upper extremity supported Standing balance-Leahy Scale: Good                              Cognition Arousal/Alertness: Awake/alert Behavior During Therapy: WFL for tasks assessed/performed Overall Cognitive Status: Within Functional Limits for tasks assessed                                        Exercises      General Comments        Pertinent Vitals/Pain Pain Assessment: No/denies pain    Home Living                      Prior Function            PT Goals (current goals can now be found in the care plan section) Progress towards PT goals: Progressing toward goals    Frequency    Min 2X/week      PT Plan Current plan remains appropriate    Co-evaluation              AM-PAC PT "6 Clicks" Mobility   Outcome Measure  Help needed turning from your back to your side while in a flat bed without using bedrails?: None Help needed moving from lying on your back to sitting on the side of a flat bed without using bedrails?: None Help needed moving to and  from a bed to a chair (including a wheelchair)?: None Help needed standing up from a chair using your arms (e.g., wheelchair or bedside chair)?: None Help needed to walk in hospital room?: A Little Help needed climbing 3-5 steps with a railing? : A Little 6 Click Score: 22    End of Session Equipment Utilized During Treatment: Gait belt Activity Tolerance: Patient tolerated treatment well Patient left: in chair;with call bell/phone within reach;with chair alarm set         Time: 1110-1119 PT Time Calculation (min) (ACUTE ONLY): 9 min  Charges:  $Gait Training: 8-22 mins                     Chesley Noon, PTA 09/03/18, 11:24 AM

## 2018-09-03 NOTE — Discharge Summary (Signed)
[place] [facility] [place] NAME: Gary Harmon    MR#:  [id number]  DATE OF BIRTH:  06/02/1940  DATE OF ADMISSION:  08/25/2018 ADMITTING PHYSICIAN: Fritzi Mandes, MD  DATE OF DISCHARGE: No discharge date for patient encounter.  PRIMARY CARE PHYSICIAN: Valera Castle, MD   ADMISSION DIAGNOSIS:  cardiac arrest ems  DISCHARGE DIAGNOSIS:  Active Problems:   Cardiorespiratory arrest ([place])   SECONDARY DIAGNOSIS:   Past Medical History:  Diagnosis Date  . Gout   . Hypertension   . Rheumatic fever      ADMITTING HISTORY  HISTORY OF PRESENT ILLNESS:  Gary Harmon  is a 78 y.o. male with a known history of congestive heart failure systolic, chronic kidney disease stage III, hypertension, gout comes to the emergency room after he had a cardiac arrest at [employer] while shopping. According to the wife and ER records patient was having a routine day and while at checkout per wife he all of a sudden collapsed. Immediate CPR was initiated at the store. Upon EMS arrival patient was alternated between V. fib and PEA arrest. He was shocked four times per EMS. Left tibia intra-osseous line was obtained. Received 6 mg of epinephrine, 450 mg of amiodarone, and  bicarb and one amp of calcium. Patient was intubated on the ventilator now. He is minimally responsive with signs of purposeful movement. Per ER physician he is therefore not considered code ICE. EKG shows sinus tachycardia with PVCs. CT head CT cervical spine essentially negative.  Patient was noted to have potassium of less than 2.0. He is getting IV potassium IV magnesium. He is currently on IV propofol. Patient is being admitted for further evaluation management.   HOSPITAL COURSE:   Gary Harmon a56 y.o.malewith a known history of congestive heart failure systolic, chronic kidney disease stage III, hypertension, gout comes to the emergency room after he had a cardiac arrest at [employer] while shopping.  According to the wife and ER records patient was having a routine day and while at checkout per wife he all of a sudden collapsed.  1.Cardiorespiratory arrest. - much improved and now extubated.  - suspected due to severe electrolyte abnormality with hypokalemia and hypomagnesemia and currently stable.  -Initial ICU stay on the ventilator.  Now extubated and on room air.  Stable heart rate and blood pressure.  Seen by cardiology Dr. Ubaldo Glassing.  2.Severe electrolyte abnormality with hypokalemia and severe hypomagnesimia - Improved with supplementation and now normalized.  - Patient was on Lokelma as an outpatient due to worsening hyperkalemia due to Aldactone.  Now much improved.  We will continue to monitor serial electrolytes. -Lokelma discontinued at discharge.  ARB stopped.  3.Acute on Chronic kidney disease stage IV with ATN  Due to cardiorespiratory arrest -baseline creatinine around1.5--2.6--3.75--4.98--5.5--5.9--5.1--4.5 --3.7 - Urine output is fairly good with 1500 cc/24 hrs.  Cont. To monitor.  -Discussed with Dr. Zollie Scale on day of discharge.  Good urine output and improving creatinine.  Follow-up as outpatient in 1 week. -Diuretics stopped at discharge  4.Elevated troponin in the setting of cardiac arrest and ATN - Dr. Bethanne Ginger input appreciated--no further cardiac diagnostics at present. NO CP and pt. Is asymptomatic.   5.elevated transaminases -appears shock liver with Cardiac arrest - normalized.  6. UTI/Aspiration pneumonia - on Augmentin and will finished treatment today. - urine culture + for 100,000 colonies of E. coli which is pansensitive. Afebrile.  Normal WBC.  Patient worked with physical therapy.  Now his vitals  are stable.  On room air.  Afebrile.  Normal WBC.  Renal function is improving.  Potassium and magnesium normalized.  Liver function test back to normal.  Patient discharged home in stable condition with home health services.  CONSULTS OBTAINED:   Treatment Team:  Teodoro Spray, MD Murlean Iba, MD Henreitta Leber, MD  DRUG ALLERGIES:  No Known Allergies  DISCHARGE MEDICATIONS:   Allergies as of 09/03/2018   No Known Allergies     Medication List    STOP taking these medications   benazepril 20 MG tablet Commonly known as:  LOTENSIN   metoprolol succinate 100 MG 24 hr tablet Commonly known as:  TOPROL-XL   sodium zirconium cyclosilicate 10 g Pack packet Commonly known as:  Lokelma   torsemide 20 MG tablet Commonly known as:  DEMADEX     TAKE these medications   acetaminophen 325 MG tablet Commonly known as:  TYLENOL Take 650 mg by mouth every 6 (six) hours as needed for mild pain.   allopurinol 100 MG tablet Commonly known as:  ZYLOPRIM Take 1 tablet (100 mg total) by mouth 2 (two) times daily. What changed:  when to take this   ALPRAZolam 0.5 MG tablet Commonly known as:  XANAX Take 0.5 mg by mouth at bedtime as needed for sleep.   aspirin EC 81 MG tablet Take 81 mg by mouth daily.   carvedilol 3.125 MG tablet Commonly known as:  COREG Take 1 tablet (3.125 mg total) by mouth 2 (two) times daily with a meal.   citalopram 10 MG tablet Commonly known as:  CELEXA Take 10 mg by mouth daily.   pravastatin 10 MG tablet Commonly known as:  PRAVACHOL Take 10 mg by mouth daily.       Today   VITAL SIGNS:  Blood pressure 139/84, pulse 100, temperature 98.1 F (36.7 C), temperature source Oral, resp. rate 20, height 5\' 7"  (1.702 m), weight 79.4 kg, SpO2 99 %.  I/O:    Intake/Output Summary (Last 24 hours) at 09/03/2018 1259 Last data filed at 09/03/2018 0453 Gross per 24 hour  Intake -  Output 1080 ml  Net -1080 ml    PHYSICAL EXAMINATION:  Physical Exam  GENERAL:  78 y.o.-year-old patient lying in the bed with no acute distress.  LUNGS: Normal breath sounds bilaterally, no wheezing, rales,rhonchi or crepitation. No use of accessory muscles of respiration.  CARDIOVASCULAR: S1, S2  normal. No murmurs, rubs, or gallops.  ABDOMEN: Soft, non-tender, non-distended. Bowel sounds present. No organomegaly or mass.  NEUROLOGIC: Moves all 4 extremities. PSYCHIATRIC: The patient is alert and oriented x 3.  SKIN: No obvious rash, lesion, or ulcer.   DATA REVIEW:   CBC Recent Labs  Lab 09/01/18 0422  WBC 10.3  HGB 9.9*  HCT 31.0*  PLT 247    Chemistries  Recent Labs  Lab 08/28/18 0655  09/03/18 0556  NA 137   < > 138  K 4.3   < > 4.0  CL 100   < > 103  CO2 21*   < > 23  GLUCOSE 113*   < > 85  BUN 52*   < > 46*  CREATININE 4.98*   < > 3.70*  CALCIUM 8.1*   < > 8.1*  AST 354*  --   --   ALT 708*  --   --   ALKPHOS 71  --   --   BILITOT 0.7  --   --    < > =  values in this interval not displayed.    Cardiac Enzymes Recent Labs  Lab 08/28/18 0655  TROPONINI 2.74*    Microbiology Results  Results for orders placed or performed during the hospital encounter of 08/25/18  SARS Coronavirus 2 (CEPHEID - Performed in [place] hospital lab), Hosp Order     Status: None   Collection Time: 08/25/18  9:13 AM  Result Value Ref Range Status   SARS Coronavirus 2 NEGATIVE NEGATIVE Final    Comment: (NOTE) If result is NEGATIVE SARS-CoV-2 target nucleic acids are NOT DETECTED. The SARS-CoV-2 RNA is generally detectable in upper and lower  respiratory specimens during the acute phase of infection. The lowest  concentration of SARS-CoV-2 viral copies this assay can detect is 250  copies / mL. A negative result does not preclude SARS-CoV-2 infection  and should not be used as the sole basis for treatment or other  patient management decisions.  A negative result may occur with  improper specimen collection / handling, submission of specimen other  than nasopharyngeal swab, presence of viral mutation(s) within the  areas targeted by this assay, and inadequate number of viral copies  (<250 copies / mL). A negative result must be combined with clinical   observations, patient history, and epidemiological information. If result is POSITIVE SARS-CoV-2 target nucleic acids are DETECTED. The SARS-CoV-2 RNA is generally detectable in upper and lower  respiratory specimens dur ing the acute phase of infection.  Positive  results are indicative of active infection with SARS-CoV-2.  Clinical  correlation with patient history and other diagnostic information is  necessary to determine patient infection status.  Positive results do  not rule out bacterial infection or co-infection with other viruses. If result is PRESUMPTIVE POSTIVE SARS-CoV-2 nucleic acids MAY BE PRESENT.   A presumptive positive result was obtained on the submitted specimen  and confirmed on repeat testing.  While 2019 novel coronavirus  (SARS-CoV-2) nucleic acids may be present in the submitted sample  additional confirmatory testing may be necessary for epidemiological  and / or clinical management purposes  to differentiate between  SARS-CoV-2 and other Sarbecovirus currently known to infect humans.  If clinically indicated additional testing with an alternate test  methodology [phone]) is advised. The SARS-CoV-2 RNA is generally  detectable in upper and lower respiratory sp ecimens during the acute  phase of infection. The expected result is Negative. Fact Sheet for Patients:  StrictlyIdeas.no Fact Sheet for Healthcare Providers: BankingDealers.co.za This test is not yet approved or cleared by the [place] FDA and has been authorized for detection and/or diagnosis of SARS-CoV-2 by FDA under an Emergency Use Authorization (EUA).  This EUA will remain in effect (meaning this test can be used) for the duration of the COVID-19 declaration under Section 564(b)(1) of the Act, 21 U.S.C. section 360bbb-3(b)(1), unless the authorization is terminated or revoked sooner. Performed at [facility], [address] [place], [place] [address]   MRSA PCR Screening     Status: Abnormal   Collection Time: 08/25/18 12:37 PM  Result Value Ref Range Status   MRSA by PCR (A) NEGATIVE Final    INVALID, UNABLE TO DETERMINE THE PRESENCE OF TARGET DNA DUE TO SPECIMEN INTEGRITY. RECOLLECTION REQUESTED.    Comment: RESULT CALLED TO, READ BACK BY AND VERIFIED WITH: CALLED TO TAYLOR @1518  08/25/2018 [facility] Performed at [facility], [address] [place], [place] [address]   C difficile quick scan w PCR reflex     Status: None  Collection Time: 08/25/18  2:28 PM  Result Value Ref Range Status   C Diff antigen NEGATIVE NEGATIVE Final   C Diff toxin NEGATIVE NEGATIVE Final   C Diff interpretation No C. difficile detected.  Final    Comment: Performed at [facility], [address] [place], [place] [address]  Culture, Urine     Status: Abnormal   Collection Time: 08/26/18  2:51 AM  Result Value Ref Range Status   Specimen Description   Final    URINE, RANDOM Performed at [facility], [address] [place], [place] [address]    Special Requests   Final    Normal Performed at [facility], [address] [place], [place] [address]    Culture >=100,000 COLONIES/mL ESCHERICHIA COLI (A)  Final   Report Status 08/28/2018 FINAL  Final   Organism ID, Bacteria ESCHERICHIA COLI (A)  Final      Susceptibility   Escherichia coli - MIC*    AMPICILLIN <=2 SENSITIVE Sensitive     CEFAZOLIN <=4 SENSITIVE Sensitive     CEFTRIAXONE <=1 SENSITIVE Sensitive     CIPROFLOXACIN <=0.25 SENSITIVE Sensitive     GENTAMICIN <=1 SENSITIVE Sensitive     IMIPENEM <=0.25 SENSITIVE Sensitive     NITROFURANTOIN <=16 SENSITIVE Sensitive     TRIMETH/SULFA <=20 SENSITIVE Sensitive     AMPICILLIN/SULBACTAM <=2 SENSITIVE Sensitive     PIP/TAZO <=4 SENSITIVE Sensitive     Extended ESBL NEGATIVE Sensitive     * >=100,000 COLONIES/mL ESCHERICHIA COLI  Culture, respiratory (non-expectorated)      Status: None   Collection Time: 08/26/18  5:27 AM  Result Value Ref Range Status   Specimen Description   Final    TRACHEAL ASPIRATE Performed at [facility], [address], [place], [place] [address]    Special Requests   Final    Normal Performed at [facility], [address] [place], [place] [address]    Gram Stain   Final    NO WBC SEEN RARE GRAM POSITIVE COCCI IN PAIRS RARE GRAM POSITIVE RODS    Culture   Final    MODERATE MORAXELLA CATARRHALIS(BRANHAMELLA) BETA LACTAMASE NEGATIVE Performed at [facility], [place] [address], [place], [place] [address]    Report Status 08/28/2018 FINAL  Final  CULTURE, BLOOD (ROUTINE X 2) w Reflex to ID Panel     Status: None   Collection Time: 08/26/18  5:59 AM  Result Value Ref Range Status   Specimen Description BLOOD RIGHT HAND  Final   Special Requests   Final    BOTTLES DRAWN AEROBIC AND ANAEROBIC Blood Culture adequate volume   Culture   Final    NO GROWTH 5 DAYS Performed at [facility], [address] [place], [place] [address]    Report Status 08/31/2018 FINAL  Final  CULTURE, BLOOD (ROUTINE X 2) w Reflex to ID Panel     Status: None   Collection Time: 08/26/18  6:11 AM  Result Value Ref Range Status   Specimen Description BLOOD BLOOD LEFT WRIST  Final   Special Requests   Final    BOTTLES DRAWN AEROBIC AND ANAEROBIC Blood Culture results may not be optimal due to an inadequate volume of blood received in culture bottles   Culture   Final    NO GROWTH 5 DAYS Performed at [facility], [address], [place], [place] [address]    Report Status 08/31/2018 FINAL  Final  MRSA PCR Screening     Status: None   Collection Time: 08/26/18  9:31 AM  Result Value Ref Range Status   MRSA by PCR NEGATIVE NEGATIVE Final    Comment:        The GeneXpert MRSA Assay (FDA approved for NASAL specimens only), is one component of a comprehensive MRSA colonization surveillance program.  It is not intended to diagnose MRSA infection nor to guide or monitor treatment for MRSA infections. Performed at [facility], [address] [place], [place] [address]     RADIOLOGY:  No results found.  Follow up with PCP in 1 week.  Management plans discussed with the patient, family and they are in agreement.  CODE STATUS:     Code Status Orders  (From admission, onward)         Start     Ordered   08/25/18 1236  Full code  Continuous     08/25/18 1235        Code Status History    Date Active Date Inactive Code Status Order ID Comments User Context   10/13/2015 1434 10/13/2015 1843 Full Code [id number]  Yolonda Kida, MD Inpatient   07/10/2015 1901 07/12/2015 1546 Full Code [id number]  Dustin Flock, MD Inpatient      TOTAL TIME TAKING CARE OF THIS PATIENT ON DAY OF DISCHARGE: more than 30 minutes.   Leia Alf Kyndall Amero M.D on 09/03/2018 at 12:59 PM  Between 7am to 6pm - Pager - [phone]  After 6pm go to [email or web address] - password [email or web address] [facility]  Office  [phone]  CC: Primary care physician; Valera Castle, MD  Note: This dictation was prepared with Dragon dictation along with smaller phrase technology. Any transcriptional errors that result from this process are unintentional.

## 2018-09-03 NOTE — Discharge Instructions (Signed)
Keep yourself well hydrated.  F/U with Dr. Holley Raring in 1 week

## 2018-09-03 NOTE — Progress Notes (Signed)
[place], [place] 09/03/18  Subjective:  Patient seen at bedside. In good spirits. Creatinine down to 3.7. Good urine output of 1.4 L over the preceding 24 hours.    Objective:  Vital signs in last 24 hours:  Temp:  [98 F (36.7 C)-98.6 F (37 C)] 98.1 F (36.7 C) (05/21 0920) Pulse Rate:  [96-104] 100 (05/21 0920) Resp:  [18-20] 20 (05/21 0454) BP: (122-145)/(77-92) 139/84 (05/21 0920) SpO2:  [96 %-100 %] 99 % (05/21 0920) Weight:  [79.4 kg] 79.4 kg (05/21 0453)  Weight change: -9.934 kg Filed Weights   09/01/18 2300 09/02/18 0425 09/03/18 0453  Weight: 89.3 kg 90.3 kg 79.4 kg    Intake/Output:    Intake/Output Summary (Last 24 hours) at 09/03/2018 1429 Last data filed at 09/03/2018 0453 Gross per 24 hour  Intake -  Output 1080 ml  Net -1080 ml     Physical Exam: General: No acute distress  HEENT Moist oral mucus membranes, hearing intact  Neck supple  Pulm/lungs scattered rhonchi, normal effort  CVS/Heart Regular  Abdomen:  Soft. NTND  Extremities: + pitting edema  Neurologic: Alert, able to follow commands appropriately  Skin: No acute rashes          Basic Metabolic Panel:  Recent Labs  Lab 08/29/18 0929 08/30/18 0450 08/31/18 0530 09/01/18 0422 09/02/18 0307 09/03/18 0556  NA 140 141 140 141 140 138  K 4.2 4.0 3.7 4.0 3.8 4.0  CL 105 103 102 106 104 103  CO2 24 26 24 23 24 23   GLUCOSE 99 104* 88 98 103* 85  BUN 58* 60* 62* 60* 56* 46*  CREATININE 5.58* 5.52* 5.99* 5.13* 4.51* 3.70*  CALCIUM 8.4* 8.4* 8.3* 8.8* 8.6* 8.1*  PHOS 3.7 3.7 3.8 3.9 3.8  --      CBC: Recent Labs  Lab 08/28/18 0655 09/01/18 0422  WBC 9.2 10.3  NEUTROABS 7.4  --   HGB 10.2* 9.9*  HCT 31.6* 31.0*  MCV 92.9 96.3  PLT 251 247     No results found for: HEPBSAG, HEPBSAB, HEPBIGM    Microbiology:  Recent Results (from the past 240 hour(s))  SARS Coronavirus 2 (CEPHEID - Performed in [place] hospital lab), Hosp Order      Status: None   Collection Time: 08/25/18  9:13 AM  Result Value Ref Range Status   SARS Coronavirus 2 NEGATIVE NEGATIVE Final    Comment: (NOTE) If result is NEGATIVE SARS-CoV-2 target nucleic acids are NOT DETECTED. The SARS-CoV-2 RNA is generally detectable in upper and lower  respiratory specimens during the acute phase of infection. The lowest  concentration of SARS-CoV-2 viral copies this assay can detect is 250  copies / mL. A negative result does not preclude SARS-CoV-2 infection  and should not be used as the sole basis for treatment or other  patient management decisions.  A negative result may occur with  improper specimen collection / handling, submission of specimen other  than nasopharyngeal swab, presence of viral mutation(s) within the  areas targeted by this assay, and inadequate number of viral copies  (<250 copies / mL). A negative result must be combined with clinical  observations, patient history, and epidemiological information. If result is POSITIVE SARS-CoV-2 target nucleic acids are DETECTED. The SARS-CoV-2 RNA is generally detectable in upper and lower  respiratory specimens dur ing the acute phase of infection.  Positive  results are indicative of active infection with SARS-CoV-2.  Clinical  correlation with patient history  and other diagnostic information is  necessary to determine patient infection status.  Positive results do  not rule out bacterial infection or co-infection with other viruses. If result is PRESUMPTIVE POSTIVE SARS-CoV-2 nucleic acids MAY BE PRESENT.   A presumptive positive result was obtained on the submitted specimen  and confirmed on repeat testing.  While 2019 novel coronavirus  (SARS-CoV-2) nucleic acids may be present in the submitted sample  additional confirmatory testing may be necessary for epidemiological  and / or clinical management purposes  to differentiate between  SARS-CoV-2 and other Sarbecovirus currently known  to infect humans.  If clinically indicated additional testing with an alternate test  methodology [phone]) is advised. The SARS-CoV-2 RNA is generally  detectable in upper and lower respiratory sp ecimens during the acute  phase of infection. The expected result is Negative. Fact Sheet for Patients:  StrictlyIdeas.no Fact Sheet for Healthcare Providers: BankingDealers.co.za This test is not yet approved or cleared by the [place] FDA and has been authorized for detection and/or diagnosis of SARS-CoV-2 by FDA under an Emergency Use Authorization (EUA).  This EUA will remain in effect (meaning this test can be used) for the duration of the COVID-19 declaration under Section 564(b)(1) of the Act, 21 U.S.C. section 360bbb-3(b)(1), unless the authorization is terminated or revoked sooner. Performed at [facility], [address] [place], [place] [address]   MRSA PCR Screening     Status: Abnormal   Collection Time: 08/25/18 12:37 PM  Result Value Ref Range Status   MRSA by PCR (A) NEGATIVE Final    INVALID, UNABLE TO DETERMINE THE PRESENCE OF TARGET DNA DUE TO SPECIMEN INTEGRITY. RECOLLECTION REQUESTED.    Comment: RESULT CALLED TO, READ BACK BY AND VERIFIED WITH: CALLED TO TAYLOR @1518  08/25/2018 [facility] Performed at [facility], [address] [place], [place] [address]   C difficile quick scan w PCR reflex     Status: None   Collection Time: 08/25/18  2:28 PM  Result Value Ref Range Status   C Diff antigen NEGATIVE NEGATIVE Final   C Diff toxin NEGATIVE NEGATIVE Final   C Diff interpretation No C. difficile detected.  Final    Comment: Performed at [facility], [address] [place], [place] [address]  Culture, Urine     Status: Abnormal   Collection Time: 08/26/18  2:51 AM  Result Value Ref Range Status   Specimen Description   Final    URINE, RANDOM Performed at [facility], [address] [place], [place] [address]    Special Requests   Final    Normal Performed at [facility], [place], [place] [address]    Culture >=100,000 COLONIES/mL ESCHERICHIA COLI (A)  Final   Report Status 08/28/2018 FINAL  Final   Organism ID, Bacteria ESCHERICHIA COLI (A)  Final      Susceptibility   Escherichia coli - MIC*    AMPICILLIN <=2 SENSITIVE Sensitive     CEFAZOLIN <=4 SENSITIVE Sensitive     CEFTRIAXONE <=1 SENSITIVE Sensitive     CIPROFLOXACIN <=0.25 SENSITIVE Sensitive     GENTAMICIN <=1 SENSITIVE Sensitive     IMIPENEM <=0.25 SENSITIVE Sensitive     NITROFURANTOIN <=16 SENSITIVE Sensitive     TRIMETH/SULFA <=20 SENSITIVE Sensitive     AMPICILLIN/SULBACTAM <=2 SENSITIVE Sensitive     PIP/TAZO <=4 SENSITIVE Sensitive     Extended ESBL NEGATIVE Sensitive     * >=100,000 COLONIES/mL ESCHERICHIA COLI  Culture, respiratory (non-expectorated)     Status: None   Collection Time: 08/26/18  5:27 AM  Result Value Ref Range Status   Specimen Description   Final    TRACHEAL ASPIRATE Performed at [facility], [address], [place], [place] [address]    Special Requests   Final    Normal Performed at [facility], [place], [place] [address]    Gram Stain   Final    NO WBC SEEN RARE GRAM POSITIVE COCCI IN PAIRS RARE GRAM POSITIVE RODS    Culture   Final    MODERATE MORAXELLA CATARRHALIS(BRANHAMELLA) BETA LACTAMASE NEGATIVE Performed at [facility], [address], [place], [place] [address]    Report Status 08/28/2018 FINAL  Final  CULTURE, BLOOD (ROUTINE X 2) w Reflex to ID Panel     Status: None   Collection Time: 08/26/18  5:59 AM  Result Value Ref Range Status   Specimen Description BLOOD RIGHT HAND  Final   Special Requests   Final    BOTTLES DRAWN AEROBIC AND ANAEROBIC Blood Culture adequate volume   Culture   Final    NO GROWTH 5 DAYS Performed at [facility], [address] [place], [place] [address]    Report Status 08/31/2018 FINAL  Final  CULTURE, BLOOD (ROUTINE X 2) w Reflex to ID Panel     Status: None   Collection Time: 08/26/18  6:11 AM  Result Value Ref Range Status   Specimen Description BLOOD BLOOD LEFT WRIST  Final   Special Requests   Final    BOTTLES DRAWN AEROBIC AND ANAEROBIC Blood Culture results may not be optimal due to an inadequate volume of blood received in culture bottles   Culture   Final    NO GROWTH 5 DAYS Performed at [facility], [address] [place], [place] [address]    Report Status 08/31/2018 FINAL  Final  MRSA PCR Screening     Status: None   Collection Time: 08/26/18  9:31 AM  Result Value Ref Range Status   MRSA by PCR NEGATIVE NEGATIVE Final    Comment:        The GeneXpert MRSA Assay (FDA approved for NASAL specimens only), is one component of a comprehensive MRSA colonization surveillance program. It is not intended to diagnose MRSA infection nor to guide or monitor treatment for MRSA infections. Performed at [facility], [address] [place],  [address]     Coagulation Studies: No results for input(s): LABPROT, INR in the last 72 hours.  Urinalysis: No results for input(s): COLORURINE, LABSPEC, PHURINE, GLUCOSEU, HGBUR, BILIRUBINUR, KETONESUR, PROTEINUR, UROBILINOGEN, NITRITE, LEUKOCYTESUR in the last 72 hours.  Invalid input(s): APPERANCEUR    Imaging: No results found.   Medications:   . sodium chloride Stopped (08/28/18 0148)   . amoxicillin-clavulanate  1 tablet Oral Q12H  . carvedilol  3.125 mg Oral BID WC  . citalopram  10 mg Oral Daily  . heparin  5,000 Units Subcutaneous Q8H  . mouth rinse  15 mL Mouth Rinse BID  . sodium chloride flush  3 mL Intravenous Q12H     Assessment/ Plan:  78 y.o. Caucasian male  with past medical history hypertension, obesity, lower extremity edema, chronic systolic heart failure with ejection fraction of 40%,  obstructive sleep apnea, gout, and depression  1. Severe Hypokalemia 2. AKI on CKD st 3. Baseline Cr 1.50/GFR 45 on 08/17/2018 3. Hypomagnesemia 4. Acute resp  failure, post outpatient cardiac arrest 5. LE Edema 6.  UTI, E. coli  Plan: Overall the patient has improved.  His creatinine is down to 3.7 with urine output of 1.4 L over the preceding 24 hours.  His hyperkalemia has also improved and potassium currently 4.0.  Recommend holding Lokelma as well as torsemide until further reevaluation as an outpatient.  Advised patient on fluid restriction while at home.  He verbalized understanding of this.  We will continue to monitor the patient's progress closely as an outpatient and I plan to see him in the office on Monday for repeat lab work as well as evaluation.   LOS: 9 Aaren Atallah 5/21/20202:29 PM  [place], [place]  Note: This note was prepared with Dragon dictation. Any transcription errors are unintentional

## 2018-09-16 ENCOUNTER — Emergency Department
Admission: EM | Admit: 2018-09-16 | Discharge: 2018-09-16 | Disposition: A | Discharge status: Home / Self Care | Payer: Medicare Other | Attending: Emergency Medicine | Admitting: Emergency Medicine

## 2018-09-16 ENCOUNTER — Emergency Department: Payer: Medicare Other

## 2018-09-16 ENCOUNTER — Other Ambulatory Visit: Payer: Self-pay

## 2018-09-16 ENCOUNTER — Encounter: Payer: Self-pay | Admitting: Emergency Medicine

## 2018-09-16 DIAGNOSIS — Z7982 Long term (current) use of aspirin: Secondary | ICD-10-CM | POA: Diagnosis not present

## 2018-09-16 DIAGNOSIS — I509 Heart failure, unspecified: Secondary | ICD-10-CM | POA: Diagnosis not present

## 2018-09-16 DIAGNOSIS — Z79899 Other long term (current) drug therapy: Secondary | ICD-10-CM | POA: Diagnosis not present

## 2018-09-16 DIAGNOSIS — R030 Elevated blood-pressure reading, without diagnosis of hypertension: Secondary | ICD-10-CM | POA: Diagnosis present

## 2018-09-16 DIAGNOSIS — I110 Hypertensive heart disease with heart failure: Secondary | ICD-10-CM | POA: Diagnosis not present

## 2018-09-16 HISTORY — DX: Heart failure, unspecified: I50.9

## 2018-09-16 LAB — CBC
HCT: 34.3 % — ABNORMAL LOW (ref 39.0–52.0)
Hemoglobin: 10.9 g/dL — ABNORMAL LOW (ref 13.0–17.0)
MCH: 29.5 pg (ref 26.0–34.0)
MCHC: 31.8 g/dL (ref 30.0–36.0)
MCV: 92.7 fL (ref 80.0–100.0)
Platelets: 312 10*3/uL (ref 150–400)
RBC: 3.7 MIL/uL — ABNORMAL LOW (ref 4.22–5.81)
RDW: 14.6 % (ref 11.5–15.5)
WBC: 8.5 10*3/uL (ref 4.0–10.5)
nRBC: 0 % (ref 0.0–0.2)

## 2018-09-16 LAB — BASIC METABOLIC PANEL
Anion gap: 10 (ref 5–15)
BUN: 33 mg/dL — ABNORMAL HIGH (ref 8–23)
CO2: 20 mmol/L — ABNORMAL LOW (ref 22–32)
Calcium: 9.1 mg/dL (ref 8.9–10.3)
Chloride: 108 mmol/L (ref 98–111)
Creatinine, Ser: 1.85 mg/dL — ABNORMAL HIGH (ref 0.61–1.24)
GFR calc Af Amer: 40 mL/min — ABNORMAL LOW (ref >60)
GFR calc non Af Amer: 35 mL/min — ABNORMAL LOW (ref >60)
Glucose, Bld: 109 mg/dL — ABNORMAL HIGH (ref 70–99)
Potassium: 4.6 mmol/L (ref 3.5–5.1)
Sodium: 138 mmol/L (ref 135–145)

## 2018-09-16 LAB — BRAIN NATRIURETIC PEPTIDE: B Natriuretic Peptide: 1501 pg/mL — ABNORMAL HIGH (ref 0.0–100.0)

## 2018-09-16 LAB — TROPONIN I
Troponin I: 0.32 ng/mL (ref <0.03)
Troponin I: 0.35 ng/mL (ref <0.03)

## 2018-09-16 NOTE — ED Notes (Signed)
Pt given warm blanket at this time 

## 2018-09-16 NOTE — ED Notes (Signed)
Date and time results received: 09/16/18 1846 (use smartphrase ".now" to insert current time)  Test: Trop Critical Value: 0.32  Name of Provider Notified: McShane  Orders Received? Or Actions Taken?: Critical Results Acknowledged

## 2018-09-16 NOTE — ED Triage Notes (Signed)
Pt presents to ED at the advice of his [place] and PCP to be evaluated for CHF exacerbation. Pt reports increase in swelling of BLE, mild increase in baseline SOB, and 2.5lb weight gain since yesterday. Had been taking torsemide but currently is not taking any diuretic.

## 2018-09-16 NOTE — Discharge Instructions (Addendum)
Please seek medical attention for any high fevers, chest pain, shortness of breath, change in behavior, persistent vomiting, bloody stool or any other new or concerning symptoms.  

## 2018-09-16 NOTE — ED Provider Notes (Signed)
[facility] Emergency Department Provider Note    ____________________________________________   I have reviewed the triage vital signs and the nursing notes.   HISTORY  Chief Complaint Congestive Heart Failure   History limited by: Not Limited   HPI Gary Harmon is a 78 y.o. male who presents to the emergency department today because of high blood pressure. The patient states that he was working with his home health nurse today when they noticed that his blood pressure was high. The patient states that he was feeling good. There are notes concerning for shortness of breath on care everywhere but when directly asked he denies any significant shortness of breath. States that he has had some slight shortness of breath in bed but not with exertion. Does say he has some swelling in his legs but that it is improving. Denies any fevers.    Records reviewed. Per medical record review patient has a history of CHF, recent admission after cardiac arrest  Past Medical History:  Diagnosis Date  . CHF (congestive heart failure) ([place])   . Gout   . Hypertension   . Rheumatic fever     Patient Active Problem List   Diagnosis Date Noted  . Cardiorespiratory arrest ([place]) 08/25/2018  . Acute CHF ([place]) 07/10/2015    Past Surgical History:  Procedure Laterality Date  . CARDIAC CATHETERIZATION Right 10/13/2015   Procedure: Right/Left Heart Cath and Coronary Angiography;  Surgeon: Yolonda Kida, MD;  Location: [facility];  Service: Cardiovascular;  Laterality: Right;  . CATARACT EXTRACTION W/PHACO Left 09/26/2014   Procedure: CATARACT EXTRACTION PHACO AND INTRAOCULAR LENS PLACEMENT (IOC);  Surgeon: Estill Cotta, MD;  Location: [facility] ORS;  Service: Ophthalmology;  Laterality: Left;  [place] 01:45   . HAMMER TOE SURGERY      Prior to Admission medications   Medication Sig Start Date End Date Taking? Authorizing Provider  acetaminophen (TYLENOL) 325 MG tablet  Take 650 mg by mouth every 6 (six) hours as needed for mild pain.     [provider]  allopurinol (ZYLOPRIM) 100 MG tablet Take 1 tablet (100 mg total) by mouth 2 (two) times daily. Patient taking differently: Take 100 mg by mouth daily.  09/29/14   Hyatt, Max T, DPM  ALPRAZolam Duanne Moron) 0.5 MG tablet Take 0.5 mg by mouth at bedtime as needed for sleep. 01/05/16   [provider]  aspirin EC 81 MG tablet Take 81 mg by mouth daily.    [provider]  carvedilol (COREG) 3.125 MG tablet Take 1 tablet (3.125 mg total) by mouth 2 (two) times daily with a meal. 09/03/18   Sudini, Srikar, MD  citalopram (CELEXA) 10 MG tablet Take 10 mg by mouth daily. 06/01/18   [provider]  pravastatin (PRAVACHOL) 10 MG tablet Take 10 mg by mouth daily.  06/08/18 06/08/19  [provider]    Allergies Patient has no known allergies.  Family History  Problem Relation Age of Onset  . Hypertension Other     Social History Social History   Tobacco Use  . Smoking status: Never Smoker  . Smokeless tobacco: Never Used  Substance Use Topics  . Alcohol use: No    Alcohol/week: 0.0 standard drinks  . Drug use: No    Review of Systems Constitutional: No fever/chills Eyes: No visual changes. ENT: No sore throat. Cardiovascular: Denies chest pain. Respiratory: Denies shortness of breath with exertion. Gastrointestinal: No abdominal pain.  No nausea, no vomiting.  No diarrhea.  Genitourinary: Negative for dysuria. Musculoskeletal: Positive for lower extremity swelling.  Skin: Negative for rash. Neurological: Negative for headaches, focal weakness or numbness.  ____________________________________________   PHYSICAL EXAM:  VITAL SIGNS: ED Triage Vitals  Enc Vitals Group     BP 09/16/18 1550 (!) 141/82     Pulse Rate 09/16/18 1550 (!) 104     Resp 09/16/18 1550 18     Temp 09/16/18 1550 97.6 F (36.4 C)     Temp Source 09/16/18 1550 Oral     SpO2 09/16/18  1550 99 %     Weight 09/16/18 1552 177 lb 7.5 oz (80.5 kg)     Height 09/16/18 1552 5\' 7"  (1.702 m)     Head Circumference --      Peak Flow --      Pain Score 09/16/18 1551 0   Constitutional: Alert and oriented.  Eyes: Conjunctivae are normal.  ENT      Head: Normocephalic and atraumatic.      Nose: No congestion/rhinnorhea.      Mouth/Throat: Mucous membranes are moist.      Neck: No stridor. Hematological/Lymphatic/Immunilogical: No cervical lymphadenopathy. Cardiovascular: Normal rate, regular rhythm.  No murmurs, rubs, or gallops.  Respiratory: Normal respiratory effort without tachypnea nor retractions. Breath sounds are clear and equal bilaterally. No wheezes/rales/rhonchi. Gastrointestinal: Soft and non tender. No rebound. No guarding.  Genitourinary: Deferred Musculoskeletal: Normal range of motion in all extremities. 1+ lower extremity edema.  Neurologic:  Normal speech and language. No gross focal neurologic deficits are appreciated.  Skin:  Skin is warm, dry and intact. No rash noted. Psychiatric: Mood and affect are normal. Speech and behavior are normal. Patient exhibits appropriate insight and judgment.  ____________________________________________    LABS (pertinent positives/negatives)  Trop 0.32 CBC wbc 8.5, hgb 10.9, plt 312 BNP 1501 BMP na 138, k 4.6, glu 109, cr 1.85  ____________________________________________   EKG  I, Nance Pear, attending physician, personally viewed and interpreted this EKG  EKG Time: 1600 Rate: 105 Rhythm: sinus tachycardia Axis: normal Intervals: qtc 470 QRS: narrow, q waves v1 ST changes: no st elevation Impression: abnormal ekg  ____________________________________________    RADIOLOGY  CXR Mild cardiomegaly and small bilateral pleural effusions  ____________________________________________   PROCEDURES  Procedures  ____________________________________________   INITIAL IMPRESSION / ASSESSMENT  AND PLAN / ED COURSE  Pertinent labs & imaging results that were available during my care of the patient were reviewed by me and considered in my medical decision making (see chart for details).   Patient presented to the emergency department he says because of elevated blood pressure.  However in discussion with him and per chart review there is also scheduled concerns for shortness of breath and increased swelling.  Patient however stated that he does not have any significant shortness of breath and the swelling is better.  Patient's blood work showed elevated BNP as well as elevated troponin.  This was repeated without any significant change.  I did offer admission however patient stated that he felt fine. He adamantly wanted to go home. He stated he will follow up with his cardiologist tomorrow. Did discuss concern for fluid overload and heart strain.   ____________________________________________   FINAL CLINICAL IMPRESSION(S) / ED DIAGNOSES  Final diagnoses:  Congestive heart failure, unspecified HF chronicity, unspecified heart failure type [facility])     Note: This dictation was prepared with Dragon dictation. Any transcriptional errors that result from this process are unintentional     Nance Pear, MD  09/16/18 2237  

## 2019-01-17 ENCOUNTER — Other Ambulatory Visit: Payer: Self-pay

## 2019-01-17 ENCOUNTER — Encounter: Payer: Self-pay | Admitting: Emergency Medicine

## 2019-01-17 ENCOUNTER — Emergency Department
Admission: EM | Admit: 2019-01-17 | Discharge: 2019-01-17 | Disposition: A | Discharge status: Home / Self Care | Payer: Medicare Other | Attending: Student | Admitting: Student

## 2019-01-17 ENCOUNTER — Emergency Department: Payer: Medicare Other

## 2019-01-17 DIAGNOSIS — I509 Heart failure, unspecified: Secondary | ICD-10-CM | POA: Diagnosis not present

## 2019-01-17 DIAGNOSIS — I110 Hypertensive heart disease with heart failure: Secondary | ICD-10-CM | POA: Diagnosis not present

## 2019-01-17 DIAGNOSIS — Z79899 Other long term (current) drug therapy: Secondary | ICD-10-CM | POA: Diagnosis not present

## 2019-01-17 DIAGNOSIS — I252 Old myocardial infarction: Secondary | ICD-10-CM | POA: Diagnosis not present

## 2019-01-17 DIAGNOSIS — Z7982 Long term (current) use of aspirin: Secondary | ICD-10-CM | POA: Insufficient documentation

## 2019-01-17 DIAGNOSIS — R0602 Shortness of breath: Secondary | ICD-10-CM | POA: Diagnosis present

## 2019-01-17 LAB — CBC WITH DIFFERENTIAL/PLATELET
Abs Immature Granulocytes: 0.03 10*3/uL (ref 0.00–0.07)
Basophils Absolute: 0.1 10*3/uL (ref 0.0–0.1)
Basophils Relative: 1 %
Eosinophils Absolute: 0.4 10*3/uL (ref 0.0–0.5)
Eosinophils Relative: 7 %
HCT: 40.5 % (ref 39.0–52.0)
Hemoglobin: 12.9 g/dL — ABNORMAL LOW (ref 13.0–17.0)
Immature Granulocytes: 1 %
Lymphocytes Relative: 11 %
Lymphs Abs: 0.7 10*3/uL (ref 0.7–4.0)
MCH: 27.1 pg (ref 26.0–34.0)
MCHC: 31.9 g/dL (ref 30.0–36.0)
MCV: 85.1 fL (ref 80.0–100.0)
Monocytes Absolute: 0.5 10*3/uL (ref 0.1–1.0)
Monocytes Relative: 8 %
Neutro Abs: 4.7 10*3/uL (ref 1.7–7.7)
Neutrophils Relative %: 72 %
Platelets: 182 10*3/uL (ref 150–400)
RBC: 4.76 MIL/uL (ref 4.22–5.81)
RDW: 15.9 % — ABNORMAL HIGH (ref 11.5–15.5)
WBC: 6.5 10*3/uL (ref 4.0–10.5)
nRBC: 0 % (ref 0.0–0.2)

## 2019-01-17 LAB — HEPATIC FUNCTION PANEL
ALT: 18 U/L (ref 0–44)
AST: 23 U/L (ref 15–41)
Albumin: 3.1 g/dL — ABNORMAL LOW (ref 3.5–5.0)
Alkaline Phosphatase: 128 U/L — ABNORMAL HIGH (ref 38–126)
Bilirubin, Direct: 1 mg/dL — ABNORMAL HIGH (ref 0.0–0.2)
Indirect Bilirubin: 1.2 mg/dL — ABNORMAL HIGH (ref 0.3–0.9)
Total Bilirubin: 2.2 mg/dL — ABNORMAL HIGH (ref 0.3–1.2)
Total Protein: 6.7 g/dL (ref 6.5–8.1)

## 2019-01-17 LAB — BASIC METABOLIC PANEL
Anion gap: 13 (ref 5–15)
BUN: 67 mg/dL — ABNORMAL HIGH (ref 8–23)
CO2: 25 mmol/L (ref 22–32)
Calcium: 9 mg/dL (ref 8.9–10.3)
Chloride: 96 mmol/L — ABNORMAL LOW (ref 98–111)
Creatinine, Ser: 2.2 mg/dL — ABNORMAL HIGH (ref 0.61–1.24)
GFR calc Af Amer: 33 mL/min — ABNORMAL LOW (ref >60)
GFR calc non Af Amer: 28 mL/min — ABNORMAL LOW (ref >60)
Glucose, Bld: 125 mg/dL — ABNORMAL HIGH (ref 70–99)
Potassium: 3.8 mmol/L (ref 3.5–5.1)
Sodium: 134 mmol/L — ABNORMAL LOW (ref 135–145)

## 2019-01-17 LAB — BRAIN NATRIURETIC PEPTIDE: B Natriuretic Peptide: 1629 pg/mL — ABNORMAL HIGH (ref 0.0–100.0)

## 2019-01-17 LAB — TROPONIN I (HIGH SENSITIVITY)
Troponin I (High Sensitivity): 41 ng/L — ABNORMAL HIGH (ref <18)
Troponin I (High Sensitivity): 46 ng/L — ABNORMAL HIGH (ref <18)

## 2019-01-17 MED ORDER — TORSEMIDE 20 MG PO TABS
20.0000 mg | ORAL_TABLET | Freq: Every day | ORAL | Status: DC
  Administered 2019-01-17: 19:00:00 20 mg via ORAL
  Filled 2019-01-17: qty 1

## 2019-01-17 MED ORDER — FUROSEMIDE 10 MG/ML IJ SOLN
80.0000 mg | Freq: Once | INTRAMUSCULAR | Status: AC
  Administered 2019-01-17: 17:00:00 80 mg via INTRAVENOUS
  Filled 2019-01-17: qty 8

## 2019-01-17 NOTE — ED Provider Notes (Signed)
Patient's BNP is elevated, c/w fluid overload. Received dose of IV Lasix, will given a dose of torsemide (his home medication) as well (takes 40 mg QD, will give an extra 20 mg here).   Repeat troponin 46 (first troponin 41).   Discussed results with patient and wife. Work up consistent with fluid overload, HF exacerbation. He remains HDS, no respiratory distress, no oxygen requirement. Given his labs, discussed option of admission for IV diuresis/managment vs diuresis here (already ordered) and close OP follow up, patient prefers discharge. Given he is in no acute respiratory distress and VS stable, will proceed w/ discharge as desired. He has an appointment tomorrow w/ his nephrologist and cardiology later in the week as well. He will follow up with them.   Discussed strict return precautions and he voices understanding.    Lilia Pro., MD 01/18/19 [phone]

## 2019-01-17 NOTE — ED Triage Notes (Signed)
Pt via EMS from home c/o of fluid build-up in his abd that started about a week ago. Pt states he c/o SOB and "winded"

## 2019-01-17 NOTE — ED Provider Notes (Signed)
[facility] Emergency Department Provider Note ____________________________________________   First MD Initiated Contact with Patient 01/17/19 1400     (approximate)  I have reviewed the triage vital signs and the nursing notes.   HISTORY  Chief Complaint Shortness of Breath    HPI Gary Harmon is a 78 y.o. male with PMH as noted below including CHF on torsemide who presents primarily with increased abdominal distention which he has had previously when he gets edema, as well as bilateral leg swelling and increased shortness of breath with exertion over the last several days.  He took a double dose of torsemide today without relief.  He denies chest pain, cough, or fever.  He has no vomiting or diarrhea.  Past Medical History:  Diagnosis Date  . CHF (congestive heart failure) ([place])   . Gout   . Hypertension   . Rheumatic fever     Patient Active Problem List   Diagnosis Date Noted  . Cardiorespiratory arrest ([place]) 08/25/2018  . Acute CHF ([place]) 07/10/2015    Past Surgical History:  Procedure Laterality Date  . CARDIAC CATHETERIZATION Right 10/13/2015   Procedure: Right/Left Heart Cath and Coronary Angiography;  Surgeon: Yolonda Kida, MD;  Location: [facility];  Service: Cardiovascular;  Laterality: Right;  . CATARACT EXTRACTION W/PHACO Left 09/26/2014   Procedure: CATARACT EXTRACTION PHACO AND INTRAOCULAR LENS PLACEMENT (IOC);  Surgeon: Estill Cotta, MD;  Location: [facility] ORS;  Service: Ophthalmology;  Laterality: Left;  [place] 01:45   . HAMMER TOE SURGERY      Prior to Admission medications   Medication Sig Start Date End Date Taking? Authorizing Provider  acetaminophen (TYLENOL) 325 MG tablet Take 650 mg by mouth every 6 (six) hours as needed for mild pain.     [provider]  allopurinol (ZYLOPRIM) 100 MG tablet Take 1 tablet (100 mg total) by mouth 2 (two) times daily. Patient taking differently: Take 100 mg by mouth  daily.  09/29/14   Hyatt, Max T, DPM  ALPRAZolam Duanne Moron) 0.5 MG tablet Take 0.5 mg by mouth at bedtime as needed for sleep. 01/05/16   [provider]  aspirin EC 81 MG tablet Take 81 mg by mouth daily.    [provider]  benazepril (LOTENSIN) 20 MG tablet Take 20 mg by mouth 2 (two) times daily.     [provider]  carvedilol (COREG) 3.125 MG tablet Take 1 tablet (3.125 mg total) by mouth 2 (two) times daily with a meal. 09/03/18   Sudini, Srikar, MD  citalopram (CELEXA) 20 MG tablet Take 20 mg by mouth daily.  06/01/18   [provider]  pravastatin (PRAVACHOL) 10 MG tablet Take 10 mg by mouth daily.  06/08/18 06/08/19  [provider]    Allergies Patient has no known allergies.  Family History  Problem Relation Age of Onset  . Hypertension Other     Social History Social History   Tobacco Use  . Smoking status: Never Smoker  . Smokeless tobacco: Never Used  Substance Use Topics  . Alcohol use: No    Alcohol/week: 0.0 standard drinks  . Drug use: No    Review of Systems  Constitutional: No fever. Eyes: No redness. ENT: No sore throat. Cardiovascular: Denies chest pain. Respiratory: Positive for shortness of breath. Gastrointestinal: No vomiting or diarrhea.  Genitourinary: Negative for dysuria.  Musculoskeletal: Negative for back pain. Skin: Negative for rash. Neurological: Negative for headache.   ____________________________________________   PHYSICAL EXAM:  VITAL SIGNS: ED Triage Vitals  Enc Vitals Group     BP 01/17/19 1344 120/67     Pulse Rate 01/17/19 1344 84     Resp 01/17/19 1344 17     Temp 01/17/19 1344 97.6 F (36.4 C)     Temp Source 01/17/19 1344 Oral     SpO2 01/17/19 1344 97 %     Weight 01/17/19 1343 188 lb (85.3 kg)     Height 01/17/19 1343 5\' 7"  (1.702 m)     Head Circumference --      Peak Flow --      Pain Score 01/17/19 1343 0     Pain Loc --      Pain Edu? --      Excl. in [place]? --      Constitutional: Alert and oriented.  Relatively well appearing and in no acute distress. Eyes: Conjunctivae are normal.  Head: Atraumatic. Nose: No congestion/rhinnorhea. Mouth/Throat: Mucous membranes are moist.   Neck: Normal range of motion.  Cardiovascular: Normal rate, regular rhythm. Grossly normal heart sounds.  Good peripheral circulation. Respiratory: Normal respiratory effort.  No retractions.  Somewhat decreased breath sounds bilaterally. Gastrointestinal: Soft and nontender.  Mild soft tissue edema with no significant abdominal distention.  No fluid wave. Genitourinary: No flank tenderness. Musculoskeletal: 1+ bilateral lower extremity edema.  Extremities warm and well perfused.  Neurologic:  Normal speech and language. No gross focal neurologic deficits are appreciated.  Skin:  Skin is warm and dry. No rash noted. Psychiatric: Mood and affect are normal. Speech and behavior are normal.  ____________________________________________   LABS (all labs ordered are listed, but only abnormal results are displayed)  Labs Reviewed  BASIC METABOLIC PANEL - Abnormal; Notable for the following components:      Result Value   Sodium 134 (*)    Chloride 96 (*)    Glucose, Bld 125 (*)    BUN 67 (*)    Creatinine, Ser 2.20 (*)    GFR calc non Af Amer 28 (*)    GFR calc Af Amer 33 (*)    All other components within normal limits  HEPATIC FUNCTION PANEL - Abnormal; Notable for the following components:   Albumin 3.1 (*)    Alkaline Phosphatase 128 (*)    Total Bilirubin 2.2 (*)    Bilirubin, Direct 1.0 (*)    Indirect Bilirubin 1.2 (*)    All other components within normal limits  CBC WITH DIFFERENTIAL/PLATELET - Abnormal; Notable for the following components:   Hemoglobin 12.9 (*)    RDW 15.9 (*)    All other components within normal limits  TROPONIN I (HIGH SENSITIVITY) - Abnormal; Notable for the following components:   Troponin I (High Sensitivity) 41 (*)    All other  components within normal limits  BRAIN NATRIURETIC PEPTIDE  TROPONIN I (HIGH SENSITIVITY)   ____________________________________________  EKG  ED ECG REPORT I, Arta Silence, the attending physician, personally viewed and interpreted this ECG.  Date: 01/17/2019 EKG Time: 1347 Rate: 80 Rhythm: normal sinus rhythm with PVCs QRS Axis: normal Intervals: normal ST/T Wave abnormalities: Nonspecific ST abnormalities Narrative Interpretation: Nonspecific abnormalities but no significant change when compared to EKG of 09/16/2018  ____________________________________________  RADIOLOGY  CXR: Mild cardiomegaly and venous congestion  ____________________________________________   PROCEDURES  Procedure(s) performed: No  Procedures  Critical Care performed: No ____________________________________________   INITIAL IMPRESSION / ASSESSMENT AND PLAN / ED COURSE  Pertinent labs & imaging results that were available during my  care of the patient were reviewed by me and considered in my medical decision making (see chart for details).  78 year old male with PMH as noted above including CHF presents with worsening abdominal distention which he has had previously when he develops edema.  In addition he has worsening bilateral lower extremity edema and some increased exertional shortness of breath despite taking a double dose of torsemide today.  On exam, the patient is relatively comfortable appearing and in no distress.  His O2 saturation is in the mid 90s on room air.  He has minimally increased work of breathing but no respiratory distress.  The remainder of the exam is as described above.  There is no significant abdominal distention, and he has very mild swelling to bilateral lower extremities.  Overall presentation is consistent with edema from worsening CHF.  There is no indication of ascites or any intra-abdominal cause of the patient's distention.  As the patient is not hypoxic  or in any acute distress he likely will not require admission.  I will obtain a chest x-ray, lab work-up, and reassess.  I anticipate that we will give the patient IV diuretic and reassess.  ----------------------------------------- 3:54 PM on 01/17/2019 -----------------------------------------  X-ray shows mild vascular congestion.  Creatinine is 2.2 which is relatively improved from recent baseline.  The other labs are relatively unremarkable.  Troponin is minimally elevated.  I have ordered a dose of IV Lasix and we will obtain repeat troponin after 2 hours.  If the patient continues to be stable I anticipate discharge home.  I am signing the patient out to the oncoming physician Dr. Joan Mayans.  ____________________________________________   FINAL CLINICAL IMPRESSION(S) / ED DIAGNOSES  Final diagnoses:  Acute on chronic congestive heart failure, unspecified heart failure type ([place])      NEW MEDICATIONS STARTED DURING THIS VISIT:  New Prescriptions   No medications on file     Note:  This document was prepared using Dragon voice recognition software and may include unintentional dictation errors.    Arta Silence, MD 01/17/19 1555

## 2019-01-17 NOTE — Discharge Instructions (Addendum)
Thank you for letting us take care of you in the emergency department today.   Please continue to take any regular, prescribed medications.   Please follow up with: - Your nephrology and cardiology doctors to review your ER visit  Please return to the ER for any new or worsening symptoms.

## 2019-01-19 ENCOUNTER — Other Ambulatory Visit: Payer: Self-pay

## 2019-01-19 ENCOUNTER — Ambulatory Visit
Admission: RE | Admit: 2019-01-19 | Discharge: 2019-01-19 | Disposition: A | Discharge status: Home / Self Care | Payer: Medicare Other | Source: Ambulatory Visit | Attending: Nephrology | Admitting: Nephrology

## 2019-01-19 DIAGNOSIS — N1832 Chronic kidney disease, stage 3b: Secondary | ICD-10-CM | POA: Diagnosis not present

## 2019-01-19 DIAGNOSIS — R600 Localized edema: Secondary | ICD-10-CM | POA: Insufficient documentation

## 2019-01-19 MED ORDER — FUROSEMIDE 10 MG/ML IJ SOLN
INTRAMUSCULAR | Status: AC
  Administered 2019-01-19: 40 mg via INTRAVENOUS
  Filled 2019-01-19: qty 4

## 2019-01-19 MED ORDER — FUROSEMIDE 10 MG/ML IJ SOLN
40.0000 mg | Freq: Once | INTRAMUSCULAR | Status: AC
  Administered 2019-01-19: 10:00:00 40 mg via INTRAVENOUS

## 2019-01-19 MED ORDER — SODIUM CHLORIDE FLUSH 0.9 % IV SOLN
INTRAVENOUS | Status: AC
  Filled 2019-01-19: qty 20

## 2019-01-22 ENCOUNTER — Other Ambulatory Visit: Payer: Self-pay

## 2019-01-22 ENCOUNTER — Ambulatory Visit
Admission: RE | Admit: 2019-01-22 | Discharge: 2019-01-22 | Disposition: A | Discharge status: Home / Self Care | Payer: Medicare Other | Source: Ambulatory Visit | Attending: Nephrology | Admitting: Nephrology

## 2019-01-22 DIAGNOSIS — R600 Localized edema: Secondary | ICD-10-CM | POA: Insufficient documentation

## 2019-01-22 MED ORDER — SODIUM CHLORIDE FLUSH 0.9 % IV SOLN
INTRAVENOUS | Status: AC
  Filled 2019-01-22: qty 10

## 2019-01-22 MED ORDER — FUROSEMIDE 10 MG/ML IJ SOLN
40.0000 mg | Freq: Once | INTRAMUSCULAR | Status: AC
  Administered 2019-01-22: 09:00:00 40 mg via INTRAVENOUS

## 2019-01-22 MED ORDER — FUROSEMIDE 10 MG/ML IJ SOLN
INTRAMUSCULAR | Status: AC
  Administered 2019-01-22: 40 mg via INTRAVENOUS
  Filled 2019-01-22: qty 4

## 2019-01-26 ENCOUNTER — Ambulatory Visit
Admission: RE | Admit: 2019-01-26 | Discharge: 2019-01-26 | Disposition: A | Discharge status: Home / Self Care | Payer: Medicare Other | Source: Ambulatory Visit | Attending: Nephrology | Admitting: Nephrology

## 2019-01-26 ENCOUNTER — Other Ambulatory Visit: Payer: Self-pay

## 2019-01-26 DIAGNOSIS — R600 Localized edema: Secondary | ICD-10-CM | POA: Diagnosis not present

## 2019-01-26 MED ORDER — FUROSEMIDE 10 MG/ML IJ SOLN
40.0000 mg | Freq: Once | INTRAMUSCULAR | Status: AC
  Administered 2019-01-26: 09:00:00 40 mg via INTRAVENOUS

## 2019-01-26 MED ORDER — FUROSEMIDE 10 MG/ML IJ SOLN
INTRAMUSCULAR | Status: AC
  Administered 2019-01-26: 40 mg via INTRAVENOUS
  Filled 2019-01-26: qty 4

## 2019-01-26 NOTE — OR Nursing (Signed)
Spoke with Dr Holley Raring to clarify orders, last day for patient to receive lasix will be 01/29/19.

## 2019-01-29 ENCOUNTER — Other Ambulatory Visit: Payer: Self-pay

## 2019-01-29 ENCOUNTER — Ambulatory Visit
Admission: RE | Admit: 2019-01-29 | Discharge: 2019-01-29 | Disposition: A | Discharge status: Home / Self Care | Payer: Medicare Other | Source: Ambulatory Visit | Attending: Nephrology | Admitting: Nephrology

## 2019-01-29 DIAGNOSIS — R600 Localized edema: Secondary | ICD-10-CM | POA: Diagnosis not present

## 2019-01-29 MED ORDER — FUROSEMIDE 10 MG/ML IJ SOLN
INTRAMUSCULAR | Status: AC
  Administered 2019-01-29: 40 mg via INTRAVENOUS
  Filled 2019-01-29: qty 4

## 2019-01-29 MED ORDER — SODIUM CHLORIDE FLUSH 0.9 % IV SOLN
INTRAVENOUS | Status: AC
  Filled 2019-01-29: qty 20

## 2019-01-29 MED ORDER — FUROSEMIDE 10 MG/ML IJ SOLN
40.0000 mg | Freq: Once | INTRAMUSCULAR | Status: AC
  Administered 2019-01-29: 09:00:00 40 mg via INTRAVENOUS

## 2019-02-02 ENCOUNTER — Ambulatory Visit: Payer: Medicare Other

## 2019-02-04 ENCOUNTER — Other Ambulatory Visit
Admission: RE | Admit: 2019-02-04 | Discharge: 2019-02-04 | Disposition: A | Discharge status: Home / Self Care | Payer: Medicare Other | Source: Ambulatory Visit | Attending: Internal Medicine | Admitting: Internal Medicine

## 2019-02-04 DIAGNOSIS — I10 Essential (primary) hypertension: Secondary | ICD-10-CM | POA: Diagnosis present

## 2019-02-04 LAB — BRAIN NATRIURETIC PEPTIDE: B Natriuretic Peptide: 3683 pg/mL — ABNORMAL HIGH (ref 0.0–100.0)

## 2019-02-05 ENCOUNTER — Ambulatory Visit: Admission: RE | Admit: 2019-02-05 | Payer: Medicare Other | Source: Ambulatory Visit

## 2019-02-09 ENCOUNTER — Ambulatory Visit: Payer: Medicare Other

## 2019-02-12 ENCOUNTER — Ambulatory Visit: Payer: Medicare Other

## 2019-04-12 ENCOUNTER — Ambulatory Visit (INDEPENDENT_AMBULATORY_CARE_PROVIDER_SITE_OTHER): Payer: Medicare Other | Admitting: Clinical

## 2019-04-12 ENCOUNTER — Other Ambulatory Visit: Payer: Self-pay

## 2019-04-12 DIAGNOSIS — F411 Generalized anxiety disorder: Secondary | ICD-10-CM
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
## 2019-04-23 ENCOUNTER — Emergency Department
Admission: EM | Admit: 2019-04-23 | Discharge: 2019-04-23 | Disposition: A | Discharge status: Home / Self Care | Payer: Medicare PPO | Attending: Emergency Medicine | Admitting: Emergency Medicine

## 2019-04-23 ENCOUNTER — Other Ambulatory Visit: Payer: Self-pay

## 2019-04-23 ENCOUNTER — Emergency Department: Payer: Medicare PPO

## 2019-04-23 DIAGNOSIS — R4182 Altered mental status, unspecified: Secondary | ICD-10-CM | POA: Diagnosis present

## 2019-04-23 DIAGNOSIS — I509 Heart failure, unspecified: Secondary | ICD-10-CM | POA: Diagnosis not present

## 2019-04-23 DIAGNOSIS — I110 Hypertensive heart disease with heart failure: Secondary | ICD-10-CM | POA: Insufficient documentation

## 2019-04-23 DIAGNOSIS — R410 Disorientation, unspecified: Secondary | ICD-10-CM | POA: Insufficient documentation

## 2019-04-23 LAB — COMPREHENSIVE METABOLIC PANEL
ALT: 12 U/L (ref 0–44)
AST: 20 U/L (ref 15–41)
Albumin: 3.4 g/dL — ABNORMAL LOW (ref 3.5–5.0)
Alkaline Phosphatase: 74 U/L (ref 38–126)
Anion gap: 10 (ref 5–15)
BUN: 43 mg/dL — ABNORMAL HIGH (ref 8–23)
CO2: 25 mmol/L (ref 22–32)
Calcium: 9.2 mg/dL (ref 8.9–10.3)
Chloride: 104 mmol/L (ref 98–111)
Creatinine, Ser: 2.14 mg/dL — ABNORMAL HIGH (ref 0.61–1.24)
GFR calc Af Amer: 34 mL/min — ABNORMAL LOW (ref >60)
GFR calc non Af Amer: 29 mL/min — ABNORMAL LOW (ref >60)
Glucose, Bld: 127 mg/dL — ABNORMAL HIGH (ref 70–99)
Potassium: 4.5 mmol/L (ref 3.5–5.1)
Sodium: 139 mmol/L (ref 135–145)
Total Bilirubin: 3.2 mg/dL — ABNORMAL HIGH (ref 0.3–1.2)
Total Protein: 7.4 g/dL (ref 6.5–8.1)

## 2019-04-23 LAB — CBC WITH DIFFERENTIAL/PLATELET
Abs Immature Granulocytes: 0.02 10*3/uL (ref 0.00–0.07)
Basophils Absolute: 0.1 10*3/uL (ref 0.0–0.1)
Basophils Relative: 1 %
Eosinophils Absolute: 0.1 10*3/uL (ref 0.0–0.5)
Eosinophils Relative: 2 %
HCT: 47.2 % (ref 39.0–52.0)
Hemoglobin: 14.2 g/dL (ref 13.0–17.0)
Immature Granulocytes: 0 %
Lymphocytes Relative: 17 %
Lymphs Abs: 1 10*3/uL (ref 0.7–4.0)
MCH: 26.9 pg (ref 26.0–34.0)
MCHC: 30.1 g/dL (ref 30.0–36.0)
MCV: 89.6 fL (ref 80.0–100.0)
Monocytes Absolute: 0.8 10*3/uL (ref 0.1–1.0)
Monocytes Relative: 14 %
Neutro Abs: 3.7 10*3/uL (ref 1.7–7.7)
Neutrophils Relative %: 66 %
Platelets: 157 10*3/uL (ref 150–400)
RBC: 5.27 MIL/uL (ref 4.22–5.81)
RDW: 23.9 % — ABNORMAL HIGH (ref 11.5–15.5)
Smear Review: NORMAL
WBC: 5.7 10*3/uL (ref 4.0–10.5)
nRBC: 0 % (ref 0.0–0.2)

## 2019-04-23 LAB — URINALYSIS, COMPLETE (UACMP) WITH MICROSCOPIC
Bacteria, UA: NONE SEEN
Bilirubin Urine: NEGATIVE
Glucose, UA: NEGATIVE mg/dL
Hgb urine dipstick: NEGATIVE
Ketones, ur: NEGATIVE mg/dL
Nitrite: NEGATIVE
Protein, ur: 30 mg/dL — AB
Specific Gravity, Urine: 1.014 (ref 1.005–1.030)
Squamous Epithelial / HPF: NONE SEEN (ref 0–5)
pH: 5 (ref 5.0–8.0)

## 2019-04-23 MED ORDER — QUETIAPINE FUMARATE 25 MG PO TABS: 25.0000 mg | ORAL_TABLET | Freq: Every day | ORAL | 1 refills | Status: AC

## 2019-04-23 MED ORDER — QUETIAPINE FUMARATE 25 MG PO TABS
25.0000 mg | ORAL_TABLET | Freq: Once | ORAL | Status: AC
  Administered 2019-04-23: 21:00:00 25 mg via ORAL
  Filled 2019-04-23: qty 1

## 2019-04-23 NOTE — ED Notes (Signed)
Assumed care at this time from triage.
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
## 2019-04-23 NOTE — ED Notes (Signed)
Pt assisted to toilet 

## 2019-04-23 NOTE — ED Provider Notes (Addendum)
[facility] Emergency Department Provider Note       Time seen: ----------------------------------------- 6:16 PM on 04/23/2019 -----------------------------------------   I have reviewed the triage vital signs and the nursing notes.  HISTORY  Chief Complaint Fall and Altered Mental Status   HPI Gary Harmon is a 79 y.o. male with a history of CHF, gout, hypertension, rheumatic fever who presents to the ED for periods of altered mental status and confusion.  He comes in with his wife.  Wife states that he thought there was an explosion last night that caused him to fall to the ground.  He is alert and oriented right now but continues to have episodes of confusion.  Past Medical History:  Diagnosis Date  . CHF (congestive heart failure) ([place])   . Gout   . Hypertension   . Rheumatic fever     Patient Active Problem List   Diagnosis Date Noted  . Cardiorespiratory arrest ([place]) 08/25/2018  . Acute CHF ([place]) 07/10/2015    Past Surgical History:  Procedure Laterality Date  . CARDIAC CATHETERIZATION Right 10/13/2015   Procedure: Right/Left Heart Cath and Coronary Angiography;  Surgeon: Yolonda Kida, MD;  Location: [place] [facility];  Service: Cardiovascular;  Laterality: Right;  . CATARACT EXTRACTION W/PHACO Left 09/26/2014   Procedure: CATARACT EXTRACTION PHACO AND INTRAOCULAR LENS PLACEMENT (IOC);  Surgeon: Estill Cotta, MD;  Location: [facility] ORS;  Service: Ophthalmology;  Laterality: Left;  [place] 01:45   . HAMMER TOE SURGERY      Allergies Patient has no known allergies.  Social History Social History   Tobacco Use  . Smoking status: Never Smoker  . Smokeless tobacco: Never Used  Substance Use Topics  . Alcohol use: No    Alcohol/week: 0.0 standard drinks  . Drug use: No    Review of Systems Constitutional: Negative for fever. Cardiovascular: Negative for chest pain. Respiratory: Negative for shortness of breath. Gastrointestinal:  Negative for abdominal pain, vomiting and diarrhea. Musculoskeletal: Negative for back pain. Skin: Negative for rash. Neurological: Negative for headaches, focal weakness or numbness. Psychiatric: Positive for confusion  All systems negative/normal/unremarkable except as stated in the HPI  ____________________________________________   PHYSICAL EXAM:  VITAL SIGNS: ED Triage Vitals [04/23/19 1411]  Enc Vitals Group     BP (!) 117/99     Pulse Rate 78     Resp 17     Temp 98.5 F (36.9 C)     Temp Source Oral     SpO2 98 %     Weight 190 lb (86.2 kg)     Height 5\' 7"  (1.702 m)     Head Circumference      Peak Flow      Pain Score 0     Pain Loc      Pain Edu?      Excl. in [place]?    Constitutional: Alert and oriented. Well appearing and in no distress. Eyes: Conjunctivae are normal. Normal extraocular movements. ENT      Head: Normocephalic and atraumatic.      Nose: No congestion/rhinnorhea.      Mouth/Throat: Mucous membranes are moist.      Neck: No stridor. Cardiovascular: Normal rate, regular rhythm. No murmurs, rubs, or gallops. Respiratory: Normal respiratory effort without tachypnea nor retractions. Breath sounds are clear and equal bilaterally. No wheezes/rales/rhonchi. Gastrointestinal: Soft and nontender. Normal bowel sounds Musculoskeletal: Nontender with normal range of motion in extremities. No lower extremity tenderness nor edema. Neurologic:  Normal speech  and language. No gross focal neurologic deficits are appreciated.  Skin:  Skin is warm, dry and intact. No rash noted. Psychiatric: Mood and affect are normal. Speech and behavior are normal.  ____________________________________________  EKG: Interpreted by me.  Sinus rhythm with sinus arrhythmia, low voltage QRS, normal axis, normal QT  ____________________________________________  ED COURSE:  As part of my medical decision making, I reviewed the following data within the [place]  History obtained from family if available, nursing notes, old chart and ekg, as well as notes from prior ED visits. Patient presented for confusion, we will assess with labs and imaging as indicated at this time.   Procedures  Gary Harmon was evaluated in Emergency Department on 04/23/2019 for the symptoms described in the history of present illness. He was evaluated in the context of the global COVID-19 pandemic, which necessitated consideration that the patient might be at risk for infection with the SARS-CoV-2 virus that causes COVID-19. Institutional protocols and algorithms that pertain to the evaluation of patients at risk for COVID-19 are in a state of rapid change based on information released by regulatory bodies including the CDC and federal and state organizations. These policies and algorithms were followed during the patient's care in the ED.  ____________________________________________   LABS (pertinent positives/negatives)  Labs Reviewed  CBC WITH DIFFERENTIAL/PLATELET - Abnormal; Notable for the following components:      Result Value   RDW 23.9 (*)    All other components within normal limits  COMPREHENSIVE METABOLIC PANEL - Abnormal; Notable for the following components:   Glucose, Bld 127 (*)    BUN 43 (*)    Creatinine, Ser 2.14 (*)    Albumin 3.4 (*)    Total Bilirubin 3.2 (*)    GFR calc non Af Amer 29 (*)    GFR calc Af Amer 34 (*)    All other components within normal limits  URINALYSIS, COMPLETE (UACMP) WITH MICROSCOPIC - Abnormal; Notable for the following components:   Color, Urine AMBER (*)    APPearance CLEAR (*)    Protein, ur 30 (*)    Leukocytes,Ua SMALL (*)    All other components within normal limits   RADIOLOGY Images were viewed by me  Sinus rhythm with rate of 80 bpm, low voltage, anterolateral infarct age-indeterminate, normal QT ____________________________________________   DIFFERENTIAL DIAGNOSIS   CVA, TIA, dementia, occult infection,  medication side effect  FINAL ASSESSMENT AND PLAN  Confusion   Plan: The patient had presented for periods of confusion and a recent fall secondary to same. Patient's labs were at his baseline. Patient's imaging did not reveal any acute process.  I think the asymmetric area on his chest x-ray is likely just edema.  He has not had any cough or symptoms of pneumonia.  I will advise an extra dose of Lasix tomorrow, I have discussed with psychiatry and we will add Seroquel 25 mg at night to add to try to help his hallucinations.  I think it is likely developing dementia and I have discussed this with the wife.  He is cleared for outpatient follow-up.   Laurence Aly, MD    Note: This note was generated in part or whole with voice recognition software. Voice recognition is usually quite accurate but there are transcription errors that can and very often do occur. I apologize for any typographical errors that were not detected and corrected.     Earleen Newport, MD 04/23/19 Bernadene Person,  Algis Liming, MD 04/23/19 2017

## 2019-04-23 NOTE — ED Notes (Addendum)
Pt states that he has "spells of confusion, like a dream" on and off for last three weeks, and c/o weakness from the waist down. Pt is currently AOx4. Wife states pt occasionally sees people who are not there or black spots on the bed that are not there. Wife also reports that pt has had a couple episodes at night where he thinks there is an explosion or he is fighting "cowboys and indians." Pt states he visits his PCP and has been evaluated for alzheimer's/dementia but has not been given any diagnosis of such. Pt denies any pain- has scabs on back of head that he says he picks out of anxiety.

## 2019-05-03 ENCOUNTER — Other Ambulatory Visit: Payer: Self-pay

## 2019-05-03 ENCOUNTER — Ambulatory Visit (INDEPENDENT_AMBULATORY_CARE_PROVIDER_SITE_OTHER): Payer: Medicare PPO | Admitting: Clinical

## 2019-05-03 DIAGNOSIS — F411 Generalized anxiety disorder: Secondary | ICD-10-CM

## 2019-05-03 NOTE — Progress Notes (Signed)
Virtual Visit via Video Note  I connected with Gary Harmon on 05/03/19 at  3:00 PM EST by a video enabled telemedicine application and verified that I am speaking with the correct person using two identifiers.  Location: Patient: Home Provider: Office   I discussed the limitations of evaluation and management by telemedicine and the availability of in person appointments. The patient expressed understanding and agreed to proceed.    I discussed the assessment and treatment plan with the patient. The patient was provided an opportunity to ask questions and all were answered. The patient agreed with the plan and demonstrated an understanding of the instructions.   The patient was advised to call back or seek an in-person evaluation if the symptoms worsen or if the condition fails to improve as anticipated.  I provided 40 minutes of non-face-to-face time during this encounter.     THERAPIST PROGRESS NOTE  Session Time: 3:00PM-3:40PM  Participation Level: Active  Behavioral Response: CasualAlertAnxious and Depressed  Type of Therapy: Individual Therapy  Treatment Goals addressed: Anxiety  Interventions: CBT  Summary: Gary Harmon is a 79 y.o. male who presents with GAD. The OPT therapist worked with the client for his ongoing OPT session. The OPT therapist utilized Motivational Interviewing tocontinue to creating therapeutic repore. The patient in the session was engaged and work in collaboration giving feedback about his triggers and symptoms over the past few weeks. The OPT therapist utilized Cognitive Behavioral Therapy through cognitive restructuring, working with the patient to challenge his automatic negative thought of he is not progressing fast enough to a more positive thought of giving himself credit for ongoing work and progress in his health recovery. The OPT therapist reviewed with the patient  coping strategies to assist in management of Anxiety. The OPT therapist inquired  for holistic care about the patients adherence to medication therapy.  Suicidal/Homicidal: Nowithout intent/plan  Therapist Response: The OPT therapist worked with the patient for the patients ongoing OPT treatment. The patient was engaged in his session and gave feedback in relation to triggers, symptoms, and behavior responses over the past 2 weeks. The OPT therapist worked with the patient utilizing an in session Cognitive Behavioral Therapy exercise assisting the patient to challenge negative thinking. The patient was responsive in the session and verbalized, " I am willing to take things one step at a time and make sure I am comfortable with my recovery so I dont risk going backwards". The patient indicated both compliance and effectiveness in relation to his current medication therapy. The OPT therapist will continue treatment work with the patient in his next scheduled session  Plan: Return again in 2/3 weeks.  Diagnosis: Axis I: Generalized Anxiety Disorder    Axis II: No diagnosis    Lennox Grumbles, LCSW 05/03/2019

## 2019-05-09 ENCOUNTER — Other Ambulatory Visit: Payer: Self-pay

## 2019-05-09 ENCOUNTER — Emergency Department: Payer: Medicare PPO

## 2019-05-09 ENCOUNTER — Emergency Department
Admission: EM | Admit: 2019-05-09 | Discharge: 2019-05-11 | Disposition: A | Discharge status: Skilled Nursing Facility | Payer: Medicare PPO | Attending: Emergency Medicine | Admitting: Emergency Medicine

## 2019-05-09 DIAGNOSIS — I110 Hypertensive heart disease with heart failure: Secondary | ICD-10-CM | POA: Insufficient documentation

## 2019-05-09 DIAGNOSIS — R627 Adult failure to thrive: Secondary | ICD-10-CM | POA: Insufficient documentation

## 2019-05-09 DIAGNOSIS — R4182 Altered mental status, unspecified: Secondary | ICD-10-CM | POA: Diagnosis present

## 2019-05-09 DIAGNOSIS — I509 Heart failure, unspecified: Secondary | ICD-10-CM | POA: Insufficient documentation

## 2019-05-09 DIAGNOSIS — F0390 Unspecified dementia without behavioral disturbance: Secondary | ICD-10-CM | POA: Diagnosis not present

## 2019-05-09 DIAGNOSIS — Z20822 Contact with and (suspected) exposure to covid-19: Secondary | ICD-10-CM | POA: Diagnosis not present

## 2019-05-09 LAB — COMPREHENSIVE METABOLIC PANEL
ALT: 14 U/L (ref 0–44)
AST: 21 U/L (ref 15–41)
Albumin: 3.5 g/dL (ref 3.5–5.0)
Alkaline Phosphatase: 86 U/L (ref 38–126)
Anion gap: 12 (ref 5–15)
BUN: 42 mg/dL — ABNORMAL HIGH (ref 8–23)
CO2: 25 mmol/L (ref 22–32)
Calcium: 9.7 mg/dL (ref 8.9–10.3)
Chloride: 104 mmol/L (ref 98–111)
Creatinine, Ser: 1.9 mg/dL — ABNORMAL HIGH (ref 0.61–1.24)
GFR calc Af Amer: 39 mL/min — ABNORMAL LOW (ref >60)
GFR calc non Af Amer: 33 mL/min — ABNORMAL LOW (ref >60)
Glucose, Bld: 77 mg/dL (ref 70–99)
Potassium: 5.2 mmol/L — ABNORMAL HIGH (ref 3.5–5.1)
Sodium: 141 mmol/L (ref 135–145)
Total Bilirubin: 3.6 mg/dL — ABNORMAL HIGH (ref 0.3–1.2)
Total Protein: 7.7 g/dL (ref 6.5–8.1)

## 2019-05-09 LAB — CBC WITH DIFFERENTIAL/PLATELET
Abs Immature Granulocytes: 0.01 10*3/uL (ref 0.00–0.07)
Basophils Absolute: 0.1 10*3/uL (ref 0.0–0.1)
Basophils Relative: 1 %
Eosinophils Absolute: 0.2 10*3/uL (ref 0.0–0.5)
Eosinophils Relative: 4 %
HCT: 50.8 % (ref 39.0–52.0)
Hemoglobin: 15.6 g/dL (ref 13.0–17.0)
Immature Granulocytes: 0 %
Lymphocytes Relative: 20 %
Lymphs Abs: 1.1 10*3/uL (ref 0.7–4.0)
MCH: 27.7 pg (ref 26.0–34.0)
MCHC: 30.7 g/dL (ref 30.0–36.0)
MCV: 90.2 fL (ref 80.0–100.0)
Monocytes Absolute: 0.6 10*3/uL (ref 0.1–1.0)
Monocytes Relative: 10 %
Neutro Abs: 3.6 10*3/uL (ref 1.7–7.7)
Neutrophils Relative %: 65 %
Platelets: 154 10*3/uL (ref 150–400)
RBC: 5.63 MIL/uL (ref 4.22–5.81)
RDW: 23.2 % — ABNORMAL HIGH (ref 11.5–15.5)
Smear Review: NORMAL
WBC: 5.6 10*3/uL (ref 4.0–10.5)
nRBC: 0 % (ref 0.0–0.2)

## 2019-05-09 LAB — TROPONIN I (HIGH SENSITIVITY): Troponin I (High Sensitivity): 30 ng/L — ABNORMAL HIGH (ref <18)

## 2019-05-09 MED ORDER — ALPRAZOLAM 0.5 MG PO TABS
0.5000 mg | ORAL_TABLET | Freq: Once | ORAL | Status: AC
  Administered 2019-05-09: 0.5 mg via ORAL
  Filled 2019-05-09: qty 1

## 2019-05-09 MED ORDER — ACETAMINOPHEN 325 MG PO TABS: 650.0000 mg | ORAL_TABLET | Freq: Four times a day (QID) | ORAL | Status: DC | PRN

## 2019-05-09 MED ORDER — CITALOPRAM HYDROBROMIDE 20 MG PO TABS
20.0000 mg | ORAL_TABLET | Freq: Every day | ORAL | Status: DC
  Administered 2019-05-10 – 2019-05-11 (×2): 20 mg via ORAL
  Filled 2019-05-09 (×3): qty 1

## 2019-05-09 MED ORDER — ISOSORBIDE MONONITRATE ER 60 MG PO TB24
30.0000 mg | ORAL_TABLET | Freq: Every day | ORAL | Status: DC
  Filled 2019-05-09 (×3): qty 1

## 2019-05-09 MED ORDER — ALPRAZOLAM 0.5 MG PO TABS
0.5000 mg | ORAL_TABLET | Freq: Every evening | ORAL | Status: DC | PRN
  Administered 2019-05-10 – 2019-05-11 (×3): 0.5 mg via ORAL
  Filled 2019-05-09 (×3): qty 1

## 2019-05-09 MED ORDER — ASPIRIN EC 81 MG PO TBEC
81.0000 mg | DELAYED_RELEASE_TABLET | Freq: Every day | ORAL | Status: DC
  Administered 2019-05-10 – 2019-05-11 (×2): 81 mg via ORAL
  Filled 2019-05-09 (×3): qty 1

## 2019-05-09 MED ORDER — ALLOPURINOL 100 MG PO TABS
100.0000 mg | ORAL_TABLET | Freq: Two times a day (BID) | ORAL | Status: DC
  Filled 2019-05-09 (×5): qty 1

## 2019-05-09 MED ORDER — FLUTICASONE PROPIONATE 50 MCG/ACT NA SUSP
1.0000 | Freq: Every day | NASAL | Status: DC
  Filled 2019-05-09: qty 16

## 2019-05-09 MED ORDER — CARVEDILOL 3.125 MG PO TABS
3.1250 mg | ORAL_TABLET | Freq: Two times a day (BID) | ORAL | Status: DC
  Administered 2019-05-10 – 2019-05-11 (×2): 3.125 mg via ORAL
  Filled 2019-05-09 (×3): qty 1

## 2019-05-09 MED ORDER — QUETIAPINE FUMARATE 25 MG PO TABS
25.0000 mg | ORAL_TABLET | Freq: Every day | ORAL | Status: DC
  Administered 2019-05-10: 25 mg via ORAL
  Filled 2019-05-09 (×2): qty 1

## 2019-05-09 MED ORDER — TORSEMIDE 20 MG PO TABS: 20.0000 mg | ORAL_TABLET | Freq: Two times a day (BID) | ORAL | Status: DC

## 2019-05-09 MED ORDER — HYDRALAZINE HCL 50 MG PO TABS
25.0000 mg | ORAL_TABLET | Freq: Two times a day (BID) | ORAL | Status: DC
  Filled 2019-05-09 (×3): qty 1

## 2019-05-09 MED ORDER — BENAZEPRIL HCL 20 MG PO TABS: 20.0000 mg | ORAL_TABLET | Freq: Two times a day (BID) | ORAL | Status: DC

## 2019-05-09 MED ORDER — PRAVASTATIN SODIUM 10 MG PO TABS
10.0000 mg | ORAL_TABLET | Freq: Every day | ORAL | Status: DC
  Administered 2019-05-10: 10 mg via ORAL
  Filled 2019-05-09 (×2): qty 1

## 2019-05-09 NOTE — ED Provider Notes (Signed)
[facility] Emergency Department Provider Note       Time seen: ----------------------------------------- 6:02 PM on 05/09/2019 ----------------------------------------- Level V caveat: History/ROS limited by altered mental status  I have reviewed the triage vital signs and the nursing notes.  HISTORY   Chief Complaint No chief complaint on file.    HPI Gary Harmon is a 79 y.o. male with a history of CHF, gout, hypertension, rheumatic fever who presents to the ED for failure to thrive.  Patient arrives by EMS from home after the family states for the last week he has deteriorated and today he has been even worse.  He has been bedbound for the past 2 weeks.  Has intermittent episodes of labored breathing.  No further information is available.  Past Medical History:  Diagnosis Date  . CHF (congestive heart failure) ([place])   . Gout   . Hypertension   . Rheumatic fever     Patient Active Problem List   Diagnosis Date Noted  . Cardiorespiratory arrest ([place]) 08/25/2018  . Acute CHF ([place]) 07/10/2015    Past Surgical History:  Procedure Laterality Date  . CARDIAC CATHETERIZATION Right 10/13/2015   Procedure: Right/Left Heart Cath and Coronary Angiography;  Surgeon: Yolonda Kida, MD;  Location: [place] [facility];  Service: Cardiovascular;  Laterality: Right;  . CATARACT EXTRACTION W/PHACO Left 09/26/2014   Procedure: CATARACT EXTRACTION PHACO AND INTRAOCULAR LENS PLACEMENT (IOC);  Surgeon: Estill Cotta, MD;  Location: [facility] ORS;  Service: Ophthalmology;  Laterality: Left;  [place] 01:45   . HAMMER TOE SURGERY      Allergies Patient has no known allergies.  Social History Social History   Tobacco Use  . Smoking status: Never Smoker  . Smokeless tobacco: Never Used  Substance Use Topics  . Alcohol use: No    Alcohol/week: 0.0 standard drinks  . Drug use: No    Review of Systems Unknown, reported weakness  All systems  negative/normal/unremarkable except as stated in the HPI  ____________________________________________   PHYSICAL EXAM:  VITAL SIGNS: ED Triage Vitals  Enc Vitals Group     BP      Pulse      Resp      Temp      Temp src      SpO2      Weight      Height      Head Circumference      Peak Flow      Pain Score      Pain Loc      Pain Edu?      Excl. in [place]?    Constitutional: Drowsy but responds to verbal or painful stimuli, mild distress Eyes: Conjunctivae are normal. Normal extraocular movements. ENT      Head: Normocephalic and atraumatic.      Nose: No congestion/rhinnorhea.      Mouth/Throat: Mucous membranes are moist.      Neck: No stridor. Cardiovascular: Normal rate, regular rhythm. No murmurs, rubs, or gallops. Respiratory: Normal respiratory effort without tachypnea nor retractions.  Prolonged expiration Gastrointestinal: Soft and nontender. Normal bowel sounds Musculoskeletal: Nontender with normal range of motion in extremities. No lower extremity tenderness nor edema. Neurologic: No gross focal neurologic deficits are appreciated.  Generalized weakness, nothing focal  skin:  Skin is warm, dry and intact. No rash noted. Psychiatric: Flat affect ____________________________________________  EKG: Interpreted by me.  Sinus rhythm with a rate of 78 bpm, PVCs are noted, possible septal infarct age-indeterminate, normal QT  ____________________________________________  ED COURSE:  As part of my medical decision making, I reviewed the following data within the [place] History obtained from family if available, nursing notes, old chart and ekg, as well as notes from prior ED visits. Patient presented for altered mental status, we will assess with labs and imaging as indicated at this time.   Procedures  Andry Barfuss was evaluated in Emergency Department on 05/09/2019 for the symptoms described in the history of present illness. He was evaluated  in the context of the global COVID-19 pandemic, which necessitated consideration that the patient might be at risk for infection with the SARS-CoV-2 virus that causes COVID-19. Institutional protocols and algorithms that pertain to the evaluation of patients at risk for COVID-19 are in a state of rapid change based on information released by regulatory bodies including the CDC and federal and state organizations. These policies and algorithms were followed during the patient's care in the ED.  ____________________________________________   LABS (pertinent positives/negatives)  Labs Reviewed  CBC WITH DIFFERENTIAL/PLATELET - Abnormal; Notable for the following components:      Result Value   RDW 23.2 (*)    All other components within normal limits  COMPREHENSIVE METABOLIC PANEL - Abnormal; Notable for the following components:   Potassium 5.2 (*)    BUN 42 (*)    Creatinine, Ser 1.90 (*)    Total Bilirubin 3.6 (*)    GFR calc non Af Amer 33 (*)    GFR calc Af Amer 39 (*)    All other components within normal limits  BLOOD GAS, VENOUS - Abnormal; Notable for the following components:   pCO2, Ven 43 (*)    All other components within normal limits  TROPONIN I (HIGH SENSITIVITY) - Abnormal; Notable for the following components:   Troponin I (High Sensitivity) 30 (*)    All other components within normal limits  URINALYSIS, COMPLETE (UACMP) WITH MICROSCOPIC    RADIOLOGY Images were viewed by me  CT head, chest x-ray IMPRESSION:  1. No acute findings. No intracranial mass, hemorrhage or edema.  2. 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  IMPRESSION:  No active disease. No evidence of pneumonia or pulmonary edema.  Stable cardiomegaly.  ____________________________________________   DIFFERENTIAL DIAGNOSIS   Failure to thrive, dehydration, electrolyte abnormality, MI, CVA, occult infection  FINAL ASSESSMENT AND PLAN  Altered mental status,  dementia   Plan: The patient had presented for altered mental status. Patient's labs did not reveal any acute abnormality. Patient's imaging was overall reassuring.  Patient likely suffering from dementia, unclear etiology for recent decline.  We will discuss with social work for possible nursing home placement.   Laurence Aly, MD    Note: This note was generated in part or whole with voice recognition software. Voice recognition is usually quite accurate but there are transcription errors that can and very often do occur. I apologize for any typographical errors that were not detected and corrected.     Earleen Newport, MD 05/09/19 2046

## 2019-05-09 NOTE — ED Notes (Signed)
Pharmacy tech at bedside reviewing medications with wife

## 2019-05-09 NOTE — ED Notes (Signed)
Pt taken to CT.

## 2019-05-09 NOTE — ED Notes (Signed)
Patient given drink and meal tray.  Patient's wife at bedside. MD Jimmye Norman to beside to speak with wife.

## 2019-05-09 NOTE — ED Notes (Signed)
Patient's brief checked, patient clean and dry. Patient moved from hallway bed into room. Patient placed on monitor. Patient's bed adjusted for comfort and safety. Fall band/socks placed on patient. Bed alarm placed. Lights turned down. Patient told to rest.

## 2019-05-09 NOTE — ED Triage Notes (Signed)
Pt arrives via EMS from home after family states over the last week pt has deteriorated- family states today is even worse- pt has been bed bound for the past 2 weeks- pt has intermitted episodes of laboured breathing- pt sats around 92-97% on RA

## 2019-05-09 NOTE — ED Notes (Signed)
Per patient's wife:  It is easier to administer pills by asking him to stick his tongue out and placing the pill on his tongue.   Patient will not feed himself.

## 2019-05-10 LAB — URINALYSIS, COMPLETE (UACMP) WITH MICROSCOPIC
Bacteria, UA: NONE SEEN
Bilirubin Urine: NEGATIVE
Glucose, UA: NEGATIVE mg/dL
Hgb urine dipstick: NEGATIVE
Ketones, ur: NEGATIVE mg/dL
Nitrite: NEGATIVE
Protein, ur: 30 mg/dL — AB
Specific Gravity, Urine: 1.017 (ref 1.005–1.030)
pH: 5 (ref 5.0–8.0)

## 2019-05-10 LAB — SARS CORONAVIRUS 2 (TAT 6-24 HRS): SARS Coronavirus 2: NEGATIVE

## 2019-05-10 MED ORDER — TORSEMIDE 10 MG PO TABS
10.0000 mg | ORAL_TABLET | ORAL | Status: DC
  Administered 2019-05-10: 10 mg via ORAL
  Filled 2019-05-10: qty 1

## 2019-05-10 MED ORDER — SACUBITRIL-VALSARTAN 24-26 MG PO TABS
1.0000 | ORAL_TABLET | Freq: Two times a day (BID) | ORAL | Status: DC
  Administered 2019-05-10 – 2019-05-11 (×3): 1 via ORAL
  Filled 2019-05-10 (×4): qty 1

## 2019-05-10 NOTE — Evaluation (Signed)
Physical Therapy Evaluation Patient Details Name: Gary Harmon MRN: [id number] DOB: 04/25/1940 Today's Date: 05/10/2019   History of Present Illness  Gary Harmon is a 72yoM who comes to [facility] 05/09/19 after repeated episodic confusion and hallucintaion over the past 6 weeks. Pt also c/o weakness in legs. Pt was in ED for similar 3WA. Since going home pt has now been bed bound for 2 weeks. Per ED doc 'Patient likely suffering from dementia, unclear etiology for recent decline.' PMH: CHF, gout, hypertension, rheumatic fever.  Clinical Impression  Pt admitted with above diagnosis. Pt currently with functional limitations due to the deficits listed below (see "PT Problem List"). Upon entry, pt in bed, awake and agreeable to participate. Wife at bedside preparing to assist patient with lunch. The pt is alert and oriented to self, but somewhat drowsy, confused, and slightly anxious, albeit generally pleasant and participatory. Pt has delayed initiation with strength testing and requires extensive cues to elicit the correct movement. MinA for bed mobility, modA to rise to standing, ModA to remain upright given bilat impartial knee buckling, but pt ultimately too weak to take any steps. Functional mobility assessment demonstrates increased effort/time requirements, poor tolerance, and need for physical assistance, whereas the patient performed these at a higher level of independence PTA. Pt was much more mobile without assistive device just a few weeks ago. A STR stay could offer best potential for strength prior to return to home. Pt will benefit from skilled PT intervention to increase independence and safety with basic mobility in preparation for discharge to the venue listed below.       Follow Up Recommendations SNF;Supervision for mobility/OOB;Supervision/Assistance - 24 hour    Equipment Recommendations  None recommended by PT    Recommendations for Other Services       Precautions / Restrictions  Precautions Precautions: Fall Restrictions Weight Bearing Restrictions: No      Mobility  Bed Mobility Overal bed mobility: Needs Assistance Bed Mobility: Supine to Sit;Sit to Supine     Supine to sit: Min assist Sit to supine: Min assist   General bed mobility comments: requires total A for scooting in bed, troubl;e following cues for performance  Transfers Overall transfer level: Needs assistance Equipment used: 1 person hand held assist Transfers: Sit to/from Stand Sit to Stand: Mod assist         General transfer comment: modA to remain standing for ~60seconds  Ambulation/Gait Ambulation/Gait assistance: (People cued and agreeable for side stepping at bedside, but ultimately too weak to pick eithe rfoot up from floor.)              Stairs            Wheelchair Mobility    Modified Rankin (Stroke Patients Only)       Balance Overall balance assessment: Needs assistance;History of Falls Sitting-balance support: Single extremity supported;Feet supported;Feet unsupported Sitting balance-Leahy Scale: Poor Sitting balance - Comments: needs intermittne tmina to remain sitting upright   Standing balance support: Bilateral upper extremity supported;During functional activity Standing balance-Leahy Scale: Poor Standing balance comment: needs constant support to remain up                             Pertinent Vitals/Pain Pain Assessment: No/denies pain    Home Living Family/patient expects to be discharged to:: Private residence Living Arrangements: Spouse/significant other Available Help at Discharge: Family Type of Home: House Home Access: Stairs to enter Entrance Stairs-Rails: Right;Left;Can  reach both Entrance Stairs-Number of Steps: 3 in garage Home Layout: Able to live on main level with bedroom/bathroom Home Equipment: Grab bars - tub/shower;Grab bars - toilet      Prior Function Level of Independence: Independent          Comments: Per prior admisison, patient was an avid golfer and very active. Did not require assistance for any ADLs or mobility. Did not use any AD's. is a retired Best boy   Dominant Hand: Left    Extremity/Trunk Assessment   Upper Extremity Assessment Upper Extremity Assessment: Generalized weakness    Lower Extremity Assessment Lower Extremity Assessment: Generalized weakness    Cervical / Trunk Assessment Cervical / Trunk Assessment: Normal  Communication   Communication: No difficulties  Cognition Arousal/Alertness: Awake/alert Behavior During Therapy: WFL for tasks assessed/performed Overall Cognitive Status: Within Functional Limits for tasks assessed                                        General Comments      Exercises     Assessment/Plan    PT Assessment Patient needs continued PT services  PT Problem List Decreased strength;Decreased activity tolerance;Decreased balance;Decreased mobility;Decreased cognition;Decreased knowledge of use of DME;Decreased knowledge of precautions;Decreased safety awareness       PT Treatment Interventions DME instruction;Balance training;Gait training;Stair training;Functional mobility training;Therapeutic activities;Therapeutic exercise;Patient/family education    PT Goals (Current goals can be found in the Care Plan section)  Acute Rehab PT Goals Patient Stated Goal: regain strength for mobility PT Goal Formulation: With family Time For Goal Achievement: 05/24/19 Potential to Achieve Goals: Good    Frequency Min 2X/week   Barriers to discharge Inaccessible home environment;Decreased caregiver support      Co-evaluation               AM-PAC PT "6 Clicks" Mobility  Outcome Measure Help needed turning from your back to your side while in a flat bed without using bedrails?: A Lot Help needed moving from lying on your back to sitting on the side of a flat bed  without using bedrails?: A Little Help needed moving to and from a bed to a chair (including a wheelchair)?: A Lot Help needed standing up from a chair using your arms (e.g., wheelchair or bedside chair)?: A Lot Help needed to walk in hospital room?: A Lot Help needed climbing 3-5 steps with a railing? : A Lot 6 Click Score: 13    End of Session   Activity Tolerance: Patient tolerated treatment well;Patient limited by fatigue;Patient limited by lethargy Patient left: in bed;with family/visitor present Nurse Communication: Mobility status PT Visit Diagnosis: Unsteadiness on feet (R26.81);Difficulty in walking, not elsewhere classified (R26.2);Other abnormalities of gait and mobility (R26.89);Repeated falls (R29.6);Muscle weakness (generalized) (M62.81)    Time: 0946-1000 PT Time Calculation (min) (ACUTE ONLY): 14 min   Charges:   PT Evaluation $PT Eval Moderate Complexity: 1 Mod         11:40 AM, 05/10/19 Etta Grandchild, PT, DPT Physical Therapist - [facility]  [phone] ([place])   Gordie Belvin C 05/10/2019, 11:36 AM

## 2019-05-10 NOTE — ED Notes (Signed)
Pt's wife to desk at this time in the lobby. This RN attempted to explain to patient's wife the visitation policy regarding leaving the building. Pt's wife immediately became irate and started yelling at this RN "do you have a husband? Do you have a daddy? These are extenuating circumstances!" This RN requested patient's wife to while this RN went and spoke with the charge RN and the patient's Primary RN. This RN confirmed with patient's primary RN that conversation was had with patient's wife prior to her departure from the ER earlier today that if she left, she would not be allowed to visit again today, that she would have to wait until tomorrow. This RN conferred with Marya Amsler, Agricultural consultant regarding rules, and policies regarding visitation. This RN back to lobby to speak with patient's wife. 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 This RN once again apologized to patient's wife who states, "no don't apologize to me this is unacceptable, somebody is not telling the truth (in regards to being told about the policy regarding visitation)!". Primary RN made aware of situation, Charge RN aware of situation.

## 2019-05-10 NOTE — NC FL2 (Signed)
Early LEVEL OF CARE SCREENING TOOL     IDENTIFICATION  Patient Name: Gary Harmon Birthdate: May 24, 1940 Sex: male Admission Date (Current Location): 05/09/2019  Gary Harmon and [place] Number:  Engineering geologist and Address:  [facility], [address] [place], [place] [address]      Provider Number:    Attending Physician Name and Address:  No att. providers found  Relative Name and Phone Number:       Current Level of Care: Hospital Recommended Level of Care: [place] Prior Approval Number:    Date Approved/Denied:   PASRR Number: pending  Discharge Plan: SNF    Current Diagnoses: Patient Active Problem List   Diagnosis Date Noted  . Cardiorespiratory arrest ([place]) 08/25/2018  . Acute CHF ([name]) 07/10/2015    Orientation RESPIRATION BLADDER Height & Weight     Self, Time  Normal Continent Weight: 77.1 kg Height:  5\' 7"  (170.2 cm)  BEHAVIORAL SYMPTOMS/MOOD NEUROLOGICAL BOWEL NUTRITION STATUS      Continent Diet  AMBULATORY STATUS COMMUNICATION OF NEEDS Skin   Extensive Assist Verbally Normal                       Personal Care Assistance Level of Assistance  Bathing, Feeding, Dressing Bathing Assistance: Maximum assistance Feeding assistance: Limited assistance Dressing Assistance: Maximum assistance     Functional Limitations Info             SPECIAL CARE FACTORS FREQUENCY  PT (By licensed PT), OT (By licensed OT)     PT Frequency: min 5xweekly OT Frequency: min 5xweekly            Contractures      Additional Factors Info                  Current Medications (05/10/2019):  This is the current hospital active medication list Current Facility-Administered Medications  Medication Dose Route Frequency Provider Last Rate Last Admin  . acetaminophen (TYLENOL) tablet 650 mg  650 mg Oral Q6H PRN Earleen Newport, MD      . allopurinol (ZYLOPRIM) tablet 100 mg  100 mg  Oral BID Earleen Newport, MD      . ALPRAZolam Duanne Moron) tablet 0.5 mg  0.5 mg Oral QHS PRN Earleen Newport, MD   0.5 mg at 05/10/19 0459  . aspirin EC tablet 81 mg  81 mg Oral Daily Earleen Newport, MD   81 mg at 05/10/19 1233  . benazepril (LOTENSIN) tablet 20 mg  20 mg Oral BID Earleen Newport, MD      . carvedilol (COREG) tablet 3.125 mg  3.125 mg Oral BID WC Earleen Newport, MD   3.125 mg at 05/10/19 1233  . citalopram (CELEXA) tablet 20 mg  20 mg Oral Daily Earleen Newport, MD   20 mg at 05/10/19 1233  . fluticasone (FLONASE) 50 MCG/ACT nasal spray 1-2 spray  1-2 spray Each Nare Daily Earleen Newport, MD   Stopped at 05/10/19 1232  . hydrALAZINE (APRESOLINE) tablet 25 mg  25 mg Oral BID Earleen Newport, MD      . isosorbide mononitrate (IMDUR) 24 hr tablet 30 mg  30 mg Oral Daily Earleen Newport, MD      . pravastatin (PRAVACHOL) tablet 10 mg  10 mg Oral q1800 Earleen Newport, MD      . QUEtiapine (SEROQUEL) tablet 25 mg  25 mg Oral QHS  Earleen Newport, MD      . torsemide [facility]) tablet 20-40 mg  20-40 mg Oral BID Earleen Newport, MD   Stopped at 05/10/19 1238   Current Outpatient Medications  Medication Sig Dispense Refill  . acetaminophen (TYLENOL) 325 MG tablet Take 650 mg by mouth every 6 (six) hours as needed for mild pain.     Marland Kitchen ALPRAZolam (XANAX) 0.5 MG tablet Take 0.5 mg by mouth at bedtime as needed for sleep.    Marland Kitchen aspirin EC 81 MG tablet Take 81 mg by mouth daily.    . carvedilol (COREG) 3.125 MG tablet Take 1 tablet (3.125 mg total) by mouth 2 (two) times daily with a meal. 60 tablet 0  . citalopram (CELEXA) 20 MG tablet Take 20 mg by mouth daily.     Marland Kitchen ENTRESTO 24-26 MG Take 1 tablet by mouth 2 (two) times daily.    . fluticasone (FLONASE) 50 MCG/ACT nasal spray Place 1-2 sprays into both nostrils daily.    . pravastatin (PRAVACHOL) 10 MG tablet Take 10 mg by mouth daily.     Marland Kitchen torsemide (DEMADEX) 20 MG tablet Take  10 mg by mouth every other day.     Marland Kitchen QUEtiapine (SEROQUEL) 25 MG tablet Take 1 tablet (25 mg total) by mouth at bedtime. (Patient not taking: Reported on 05/09/2019) 30 tablet 1     Discharge Medications: Please see discharge summary for a list of discharge medications.  Relevant Imaging Results:  Relevant Lab Results:   Additional Information SS#[id number]  Anselm Pancoast, RN

## 2019-05-10 NOTE — ED Notes (Signed)
Spoke to Richardson Landry (son) concerning visitation and his mother returning to visit once again today , reviewed the vistiation guidelines of one visitor per day.  And discussed that she would be more than welcome to return tomorrow , but plan to stay the day without leaving until she has completed her visit for the day, also attempt to arrange to have someone bring his mother some  food or she would be welcome to go down to cafe if she would like

## 2019-05-10 NOTE — ED Notes (Signed)
Pt repositioned and brief changed. Placed an external cath on this patient. Redness noted between the thigh and groin on each side. Barrier cream applied to each area.   Hot meal tray being fed to the pt at this time.

## 2019-05-10 NOTE — ED Notes (Signed)
Pt placed in hospital bed at this time, pt cleaned and dry. New brief placed on pt. PT in NAD. 98% on RA

## 2019-05-10 NOTE — ED Notes (Signed)
Pt laying inbed, NAD at this time, able to be visualized from nurses station

## 2019-05-10 NOTE — ED Notes (Signed)
Pt is becoming increasingly agitated. Pt removed external cath but this RN replaced cath.

## 2019-05-10 NOTE — ED Provider Notes (Signed)
-----------------------------------------   6:17 AM on 05/10/2019 -----------------------------------------   Blood pressure 121/84, pulse 84, resp. rate 19, height 5\' 7"  (1.702 m), weight 77.1 kg, SpO2 97 %.  The patient is resting at this time.  There have been no acute events since the last update.  Awaiting disposition plan from Social Work team(s).   Paulette Blanch, MD 05/10/19 [phone]

## 2019-05-10 NOTE — ED Notes (Signed)
Pt given breakfast tray. Wife at bedside. Pt brought milk and water pt wife request.

## 2019-05-10 NOTE — ED Notes (Signed)
Pt wife at bedside. Pt wife states that she was going to "leave for a couple hours" and that her daughter would come sit with him. Explained to wife that she would not be able to come back today and they are allowed one visitor per stay

## 2019-05-10 NOTE — TOC Initial Note (Signed)
Transition of Care [facility]) - Initial/Assessment Note    Patient Details  Name: Gary Harmon MRN: [id number] Date of Birth: Oct 01, 1940  Transition of Care [facility]) CM/SW Contact:    Anselm Pancoast, RN Phone Number: 05/10/2019, 1:35 PM  Clinical Narrative:                 Discussed placement with wife at bedside. Wife is seeking SNF placement for short term and eventual LTC placement. Patient has had physical and mental decline over the last few months and wife is no longer able to care for him at home. Strong family involvement but no 24 hour supervision available. Patient received his COVID vaccine 3 weeks ago and his due the second shot this week. RN CM will initiate SNF placement.   Expected Discharge Plan: [place]     Patient Goals and CMS Choice Patient states their goals for this hospitalization and ongoing recovery are:: Get to rehab and eventual LTC CMS Medicare.gov Compare Post Acute Care list provided to:: Patient Represenative (must comment)(wife) Choice offered to / list presented to : Spouse  Expected Discharge Plan and Services Expected Discharge Plan: [place]       Living arrangements for the past 2 months: Single Family Home                                      Prior Living Arrangements/Services Living arrangements for the past 2 months: Single Family Home Lives with:: Spouse Patient language and need for interpreter reviewed:: Yes Do you feel safe going back to the place where you live?: No   needs more assistance  Need for Family Participation in Patient Care: Yes (Comment) Care giver support system in place?: Yes (comment) Current home services: DME(walker) Criminal Activity/Legal Involvement Pertinent to Current Situation/Hospitalization: No - Comment as needed  Activities of Daily Living      Permission Sought/Granted                  Emotional Assessment Appearance:: Appears stated  age Attitude/Demeanor/Rapport: Other (comment)(drowsy and slow to respond)   Orientation: : Oriented to Self, Oriented to Place Alcohol / Substance Use: Never Used Psych Involvement: No (comment)  Admission diagnosis:  AMS Patient Active Problem List   Diagnosis Date Noted  . Cardiorespiratory arrest ([place]) 08/25/2018  . Acute CHF ([place]) 07/10/2015   PCP:  Valera Castle, MD Pharmacy:   [facility] [place] [facility] [place] [address] Phone: [phone] Fax: [phone]     Social Determinants of Health (SDOH) Interventions    Readmission Risk Interventions No flowsheet data found.

## 2019-05-10 NOTE — ED Notes (Signed)
After speaking to Mrs Marco via phone, she was notably upset with her interactions with staff the nurses today.  She requested this Probation officer to go assess the patients perineum,, this writer did and assessed reddens areas noted to the inner thighs , peri care completed, depends undergarment was dry of urine , and streaks of stool cleansed, no stool present.  Barrier cream added to redden area of perineum. Primofit urinary suction device added to aid in urinary incontinence.  Mrs.Jaimes aware during phone conversation the care the pt received.  Also informed Mrs.Pettijohn that primary RN is aiding in feeding the pt . Provided phone number to dietary so that Mrs.Llanes could order a breakfast for the patient in the morning.   Mrs. Rosner was offered the number to" the office of patient experience" she declined and said " I will be speaking to my lawyer"

## 2019-05-10 NOTE — ED Notes (Signed)
Pt soiled brief. Pt brief and linen changed. Pt clean and dry at this time.

## 2019-05-10 NOTE — ED Notes (Signed)
Patient agitated, repeatedly trying to climb out of bed. Patient checked, incontinence brief is dry. Blankets adjusted. Patient given water.

## 2019-05-11 MED ORDER — ALPRAZOLAM 0.5 MG PO TABS: 0.5000 mg | ORAL_TABLET | Freq: Every evening | ORAL | 0 refills | Status: AC | PRN

## 2019-05-11 NOTE — ED Notes (Signed)
Pt repositioned in bed, encouraged to get some rest.

## 2019-05-11 NOTE — ED Notes (Signed)
Pt was repositioned in bed with help from [facility], pts suction catheter reattached, pts brief clean and dry at this time. Pt was offered food and water, took a few bites of cookie and sips of water. Pt returned to laying position for aid in sleeping

## 2019-05-11 NOTE — ED Notes (Signed)
Patient is resting comfortably. 

## 2019-05-11 NOTE — ED Notes (Signed)
Pt's wife declines breakfast tray offered to patient; states she brought breakfast for him.

## 2019-05-11 NOTE — Discharge Instructions (Signed)
Please seek medical attention for any high fevers, chest pain, shortness of breath, change in behavior, persistent vomiting, bloody stool or any other new or concerning symptoms.  

## 2019-05-11 NOTE — ED Notes (Signed)
Introduced self to pt and wife. Wife reports that pt needs to use the restroom. Pt placed on bedpan with help of Amber, Therapist, sports. Pt unable to use restroom and was taken off. Pt remains in brief at this time.

## 2019-05-11 NOTE — ED Notes (Signed)
Pt was wet when EMS arrived. Pt changed with this RN and Clyde Canterbury, Development worker, community. Pt given new, dry brief. Pt also listed as DNR but wife has never had paperwork written out. Wife agrees to DNR again with this RN and EMS. Dr. Archie Balboa notified and states that [facility] can do DNR form there. EMS notified.

## 2019-05-11 NOTE — ED Notes (Signed)
Pt was repositioned in bed at this time

## 2019-05-11 NOTE — ED Notes (Signed)
Wife verbally agrees to d/c paperwork. Paperwork given to EMS.

## 2019-05-11 NOTE — ED Notes (Signed)
Pt linen changed, new brief placed on pt and pt repositioned in bed at this time

## 2019-05-11 NOTE — ED Notes (Signed)
Gary Harmon arrived to visit the patient.  This Writer went to introduce myself after having multiple phone conversation yesterday. With the attempts to gain clarification of yesterdays interactions with staff and care received.  Entering the room primary nurse Gary Harmon was speaking to Gary Harmon referencing concerns with adminstration of routine home medications.   The plan is to have Pharm Tech review home medication list that was originally clarified with Gary Harmon 05/09/19, Gary Harmon had give a paper copy of the home list to [facility] yesterday and was then was placed on the patient paper chart so the entire care team had access to.   TOC team Gary Harmon also entered and updated Gary Harmon of the progress made for finding placement

## 2019-05-11 NOTE — ED Notes (Signed)
Wife requesting some prn xanax for transport for pt. See MAR.

## 2019-05-11 NOTE — ED Notes (Signed)
Pt repositioned in bed.

## 2019-05-11 NOTE — ED Notes (Signed)
[facility]  CALLED  FOR  TRANSPORT 

## 2019-05-11 NOTE — Progress Notes (Addendum)
Approval via [facility] [id number]. Auth for 3 days with next review date due 05/13/19. Clinical can be faxed to [phone] attn: Dollene Cleveland. SNF notified by RN CM.  [facility]; Room 708. Report call to [phone], ask for 700 hall nurse.

## 2019-05-11 NOTE — ED Notes (Signed)
Pharm Tech at bedside reviewing home medication list.

## 2019-05-11 NOTE — ED Notes (Signed)
[place], Cardiologist at bedside.

## 2019-05-17 ENCOUNTER — Other Ambulatory Visit: Payer: Self-pay

## 2019-05-17 ENCOUNTER — Non-Acute Institutional Stay: Payer: Medicare PPO | Admitting: Primary Care

## 2019-05-17 DIAGNOSIS — Z515 Encounter for palliative care: Secondary | ICD-10-CM

## 2019-05-17 NOTE — Progress Notes (Signed)
[facility] Palliative Care Consult Note Telephone: [phone]  Fax: [phone]  TELEHEALTH VISIT STATEMENT Due to the COVID-19 crisis, this visit was done via telemedicine from my office. It was initiated and consented to by this patient and/or family.  PATIENT NAME: Gary Harmon [address] [place] [address] [phone] (home)  DOB: 11-18-1940 MRN: [id number]  PRIMARY CARE PROVIDER:   Valera Castle, MD, [place] [address] [phone]  REFERRING PROVIDER:  Valera Castle, [place],  [place] [address] [phone]  RESPONSIBLE PARTY:   Extended Emergency Contact Information Primary Emergency Contact: Mabe,Betty Address: [address] [place]          [place], [place] [address] Johnnette Litter of [place] Mobile Phone: [phone] Relation: Spouse Secondary Emergency Contact: Brooke Dare Mobile Phone: [phone] Relation: Daughter  I met with Mr. Firkus with Yaakov Guthrie LPN at [facility] tele[facility]icine. ASSESSMENT AND RECOMMENDATIONS:   1. Advance Care Planning/Goals of Care: Goals include to maximize quality of life and symptom management. Advance care planning: I reached out to his wife who was unable to talk this afternoon but made an appointment for advanced care plan meeting tomorrow a.m. She states she does have concerns about his decline and we will talk at that time about goals of care.  2. Symptom Management:    Pain: Recommend ATC Tylenol arthritis schedule. He was not able to interact on zoom but I observed that he seemed uncomfortable. When queried, he did state he had pain all over. He has fallen three times in six days. He has Tylenol for pain, I would suggest scheduling that every eight hours as some of his agitation may likely be chronic pain that he cannot report.  Skin integrity: Staff reports no skin breakdown as of yet but he would be at high risk due to his decreased intake.    Constipation: Recommend MiraLAX and Senna instead due to the need for  high fluid volume with Metamucil. He is constipated and has been on the bowel protocol. He appears uncomfortable and complains of some vague pain.  Currently on Metamucil.    Decline/debility: He has shown a precipitous decline in function from perhaps 40% to 30% PPS in the last six weeks. This includes his ability to ambulate, he has sustained  multiple falls, and appears to have delirium on dementia. Safety measures in place with mat placement, increased monitoring. Currently he is receiving Seroquel and Xanax. I would recommend one or the other to control for over sedation. My preference would be Seroquel as benzodiazepines can exacerbate delirium in the elderly. I would also recommend  an SSRI if not already on one. Recommend continued vigilance for infections, fractures impaction, etc. that can exacerbate delirium.   Nutrition: Patient intake is fair to poor. Weight has been stable but intake has declined. I would Recommend supplements per dietary recommendation. He has a history of up to a 10 pound weight increase due to fluid but his dry weight recently appears to be of around 188 lbs  3. Family /Caregiver/Community Supports: Wife of 64 years os POA. Currently in SNF, had been living at home. Caregiving is a concern.  4. Cognitive / Functional decline:  Delirium on dementia. Decreased in function per family report over 6 weeks.  5. Follow up Palliative Care Visit: Palliative care will continue to follow for goals of care clarification and symptom management. Return 1 weeks or prn.  I spent 25 minutes providing this  consultation,  from 1400 to 1425. More than 50% of the time in this consultation was spent coordinating communication.   HISTORY OF PRESENT ILLNESS:  Seward Coran is a 79 y.o. year old male with multiple medical problems including CHF, dementia, CKD Stage 3, frequent falls. Palliative Care was asked to follow  this patient by consultation request of Kym Groom Guy Begin, * to help address advance care planning and goals of care. This is the initial  visit.  CODE STATUS: TBD  PPS: 30% HOSPICE ELIGIBILITY/DIAGNOSIS: TBD  PAST MEDICAL HISTORY:  Past Medical History:  Diagnosis Date  . CHF (congestive heart failure) ([place])   . Gout   . Hypertension   . Rheumatic fever     SOCIAL HX:  Social History   Tobacco Use  . Smoking status: Never Smoker  . Smokeless tobacco: Never Used  Substance Use Topics  . Alcohol use: No    Alcohol/week: 0.0 standard drinks    ALLERGIES: No Known Allergies   PERTINENT MEDICATIONS:  Outpatient Encounter Medications as of 05/17/2019  Medication Sig  . acetaminophen (TYLENOL) 325 MG tablet Take 650 mg by mouth every 6 (six) hours as needed for mild pain.   Marland Kitchen ALPRAZolam (XANAX) 0.5 MG tablet Take 1 tablet (0.5 mg total) by mouth at bedtime as needed for sleep.  Marland Kitchen aspirin EC 81 MG tablet Take 81 mg by mouth daily.  . carvedilol (COREG) 3.125 MG tablet Take 1 tablet (3.125 mg total) by mouth 2 (two) times daily with a meal.  . Cholecalciferol (VITAMIN D) 50 MCG (2000 UT) tablet Take 2,000 Units by mouth daily.  . citalopram (CELEXA) 20 MG tablet Take 20 mg by mouth daily.   Marland Kitchen ENTRESTO 24-26 MG Take 1 tablet by mouth 2 (two) times daily.  . fluticasone (FLONASE) 50 MCG/ACT nasal spray Place 1-2 sprays into both nostrils daily.  . pravastatin (PRAVACHOL) 10 MG tablet Take 10 mg by mouth daily.   . psyllium (HYDROCIL/METAMUCIL) 95 % PACK Take 1 packet by mouth daily.  . QUEtiapine (SEROQUEL) 25 MG tablet Take 1 tablet (25 mg total) by mouth at bedtime. (Patient not taking: Reported on 05/09/2019)  . torsemide (DEMADEX) 20 MG tablet Take 10 mg by mouth every other day.    No facility-administered encounter medications on file as of 05/17/2019.    PHYSICAL EXAM / ROS:   Current and past weights: 188-186 lbs.Hospital weight 170 lbs. General: NAD, frail appearing,  WNWD Cardiovascular: no chest pain reported, no edema noted Pulmonary: no cough, no increased SOB, room air Abdomen: appetite fair to poor, endorses constipation, incontinent of bowel GU: denies dysuria, incontinent of urine MSK:  no joint deformities,non ambulatory, frequent falls. OT working with patient. Skin: no rashes or wounds reported, but c/o sacral pain Neurological: Weakness, delirium on dementia, FAST score 7B.   Jason Coop, NP [facility]

## 2019-05-18 ENCOUNTER — Non-Acute Institutional Stay: Payer: Medicare PPO | Admitting: Primary Care

## 2019-05-18 ENCOUNTER — Other Ambulatory Visit: Payer: Self-pay

## 2019-05-18 DIAGNOSIS — Z515 Encounter for palliative care: Secondary | ICD-10-CM

## 2019-05-18 NOTE — Progress Notes (Signed)
[facility] Palliative Care Consult Note Telephone: [phone]  Fax: [phone]  TELEHEALTH VISIT STATEMENT Due to the COVID-19 crisis, this visit was done via telemedicine from my office. It was initiated and consented to by this patient and/or family.  PATIENT NAME: Gary Harmon [address] [place] [id number] [phone] (home)  DOB: [birth year] MRN: [id number]  PRIMARY CARE PROVIDER:  Juluis Pitch, [place] [address] [place] [address] [phone]  REFERRING PROVIDER: Juluis Pitch, Harmon [address] [place],  [place] [address] [phone]  RESPONSIBLE PARTY:   Extended Emergency Contact Information Primary Emergency Contact: Gary Harmon Address: [address]          [place], [place] [address] Gary Harmon of [place] Mobile Phone: [phone] Relation: Spouse Secondary Emergency Contact: Gary Harmon Mobile Phone: [phone] Relation: Daughter   ASSESSMENT AND RECOMMENDATIONS:   1. Advance Care Planning/Goals of Care: Goals include to maximize quality of life and symptom management. MOST on file states DNR, comfort measures, no antibiotics, no iv fluids, no feeding tube. WE discussed her plan for his care. Mrs. Gary Harmon has had her own health issues and feels she needs to be sure her husband has care if something happens to her.She plans to pursue LTC. We discussed decline which has been precipitous, and chance of returning to baseline is small. We discussed him completing therapy and reassessing his situation. She states 3-4 months ago he was golfing with friends. She endorses he has exhibited cognitive changes for some time that she feels she ignored. He appears to have moderate to advanced dementia.  We discussed their faith. Mrs. Gary Harmon say she is trusting God with his outcome whatever that may be. They've been married 45 years. She states some of his recent behaviors have been very inconsistent with his personality which she finds disturbing. I  recommend close monitoring for hospice admission after skilled services are ended, due to rapid decline.  2. Symptom Management:  We discussed his need for supervision for safety due to unstable ambulation and frequent falls.   3. Family /Caregiver/Community Supports: Wife and 2 children. They are considering LTC due to hight caregiving  needs.  4. Cognitive / Functional decline: Baseline moderate to advanced dementia with rapid physical and cognitive decline in 3-4 months. Had been golfing, driving 4 months ago. Now about to stand but falls very frequently.  5. Follow up Palliative Care Visit: Palliative care will continue to follow for goals of care clarification and symptom management.She will call if she has concerns. Return 2-4 weeks or prn.  I spent 30 minutes providing this consultation,  from 0945 to 1015. More than 50% of the time in this consultation was spent coordinating communication.   HISTORY OF PRESENT ILLNESS:  Gary Harmon is a 79 y.o. year old male with multiple medical problems including CHD, CKD3, Dementia, delirium, rapid functional decline. Palliative Care was asked to follow this patient by consultation request of Gary Harmon  to help address advance care planning and goals of care. This is a follow up visit.  CODE STATUS: DNR, comfort measures.  PPS: 30% HOSPICE ELIGIBILITY/DIAGNOSIS: yes once therapies have concluded  PAST MEDICAL HISTORY:  Past Medical History:  Diagnosis Date  . CHF (congestive heart failure) ([place])   . Gout   . Hypertension   . Rheumatic fever     SOCIAL HX:  Social History   Tobacco Use  . Smoking status: Never Smoker  . Smokeless tobacco: Never Used  Substance Use Topics  .  Alcohol use: No    Alcohol/week: 0.0 standard drinks    ALLERGIES: No Known Allergies   PERTINENT MEDICATIONS:  Outpatient Encounter Medications as of 05/18/2019  Medication Sig  . acetaminophen (TYLENOL) 325 MG tablet Take 650 mg by mouth every 6 (six)  hours as needed for mild pain.   Marland Kitchen ALPRAZolam (XANAX) 0.5 MG tablet Take 1 tablet (0.5 mg total) by mouth at bedtime as needed for sleep.  Marland Kitchen aspirin EC 81 MG tablet Take 81 mg by mouth daily.  . carvedilol (COREG) 3.125 MG tablet Take 1 tablet (3.125 mg total) by mouth 2 (two) times daily with a meal.  . Cholecalciferol (VITAMIN D) 50 MCG (2000 UT) tablet Take 2,000 Units by mouth daily.  . citalopram (CELEXA) 20 MG tablet Take 20 mg by mouth daily.   Marland Kitchen ENTRESTO 24-26 MG Take 1 tablet by mouth 2 (two) times daily.  . fluticasone (FLONASE) 50 MCG/ACT nasal spray Place 1-2 sprays into both nostrils daily.  . pravastatin (PRAVACHOL) 10 MG tablet Take 10 mg by mouth daily.   . psyllium (HYDROCIL/METAMUCIL) 95 % PACK Take 1 packet by mouth daily.  . QUEtiapine (SEROQUEL) 25 MG tablet Take 1 tablet (25 mg total) by mouth at bedtime. (Patient not taking: Reported on 05/09/2019)  . torsemide (DEMADEX) 20 MG tablet Take 10 mg by mouth every other day.    No facility-administered encounter medications on file as of 05/18/2019.    PHYSICAL EXAM / ROS:  Deferred  Jason Coop, NP, [facility]

## 2019-05-26 ENCOUNTER — Non-Acute Institutional Stay: Payer: Medicare PPO | Admitting: Primary Care

## 2019-05-26 ENCOUNTER — Other Ambulatory Visit: Payer: Self-pay

## 2019-05-26 DIAGNOSIS — Z515 Encounter for palliative care: Secondary | ICD-10-CM

## 2019-05-26 NOTE — Progress Notes (Signed)
[facility] Palliative Care Consult Note Telephone: [phone]  Fax: [phone]  TELEHEALTH VISIT STATEMENT Due to the COVID-19 crisis, this visit was done via telemedicine from my office. It was initiated and consented to by this patient and/or family.  PATIENT NAME: Gary Harmon [address] [place] [id number] [phone] (home)  DOB: [birth year] MRN: [id number]  PRIMARY CARE PROVIDER:   Juluis Pitch, MD, [address] [place] [address] [phone]  REFERRING PROVIDER:  Juluis Pitch, MD [address] [place],  [place] [address] [phone]  RESPONSIBLE PARTY:   Extended Emergency Contact Information Primary Emergency Contact: Louvier,Betty Address: [address]          [place], [place] [address] Johnnette Litter of [place] Mobile Phone: [phone] Relation: Spouse Secondary Emergency Contact: Brooke Dare Mobile Phone: [phone] Relation: Daughter   ASSESSMENT AND RECOMMENDATIONS:   1. Advance Care Planning/Goals of Care: Goals include to maximize quality of life and symptom management. Wife Inez Catalina is POA. We talked about patient status, and advance directives. MOST on file with comfort measures, no additional treatments. Wife states he recognized their daughter at a window visit. We discussed where to provide his best care as she still has some hesitation about placement. For now she feels he is in the  best place for his care.  2. Symptom Management:   Agitation: PRN seroquel with depakote scheduled. Xanax d/c'ed. He appears more interactive and verbal today. He is able to answer questions but appears to be making a great effort to move his w/c. States he needs to get to the grocery store. Wife states his agitation is far from his  baseline and she finds it extremely disquieting.  Nutrition: Wife state she was feeding him at home, please assess if he can feed self a whole meal. He may need more finger foods. OT may have done this during his  time in therapy.   Pain : Improved with scheduled acetaminophen. Subsequently more verbal and interactive today on interview.  Constipation: On miralax now. Has been having bms regularly. Previously had been on metamucil with was constipated.  Mentation:  Is more interactive on video chat today answering questions. Verbal. A and O x 1.  He does show some confusion, saying he's going to [employer].  3. Family /Caregiver/Community Supports: Wife is POA. We discussed her pursuing LTC for his care as she is not able to physically care for him alone.  4. Cognitive / Functional decline: Verbal, confused as to place, time. Knows name and can answer some basic questions. W/c bound. Needs total assist due to physical and memory concerns.  5. Follow up Palliative Care Visit: Palliative care will continue to follow for goals of care clarification and symptom management. Return 4 weeks or prn.  I spent 60 minutes providing this consultation,  from 1500 to 1600. More than 50% of the time in this consultation was spent coordinating communication.   HISTORY OF PRESENT ILLNESS:  Gary Harmon is a 79 y.o. year old male with multiple medical problems including CHF, dementia, CKD Stage 3, frequent falls. Palliative Care was asked to follow this patient by consultation request of Juluis Pitch, MD to help address advance care planning and goals of care. This is a follow up visit.  CODE STATUS: DNR, full comfort measures  PPS: 30% HOSPICE ELIGIBILITY/DIAGNOSIS: TBD  PAST MEDICAL HISTORY:  Past Medical History:  Diagnosis Date  . CHF (congestive heart failure) ([place])   . Gout   . Hypertension   .  Rheumatic fever     SOCIAL HX:  Social History   Tobacco Use  . Smoking status: Never Smoker  . Smokeless tobacco: Never Used  Substance Use Topics  . Alcohol use: No    Alcohol/week: 0.0 standard drinks    ALLERGIES: No Known Allergies   PERTINENT MEDICATIONS:  Outpatient Encounter Medications as of  05/26/2019  Medication Sig  . acetaminophen (TYLENOL) 325 MG tablet Take 650 mg by mouth every 6 (six) hours as needed for mild pain.   Marland Kitchen ALPRAZolam (XANAX) 0.5 MG tablet Take 1 tablet (0.5 mg total) by mouth at bedtime as needed for sleep.  Marland Kitchen aspirin EC 81 MG tablet Take 81 mg by mouth daily.  . carvedilol (COREG) 3.125 MG tablet Take 1 tablet (3.125 mg total) by mouth 2 (two) times daily with a meal.  . Cholecalciferol (VITAMIN D) 50 MCG (2000 UT) tablet Take 2,000 Units by mouth daily.  . citalopram (CELEXA) 20 MG tablet Take 20 mg by mouth daily.   Marland Kitchen ENTRESTO 24-26 MG Take 1 tablet by mouth 2 (two) times daily.  . fluticasone (FLONASE) 50 MCG/ACT nasal spray Place 1-2 sprays into both nostrils daily.  . pravastatin (PRAVACHOL) 10 MG tablet Take 10 mg by mouth daily.   . psyllium (HYDROCIL/METAMUCIL) 95 % PACK Take 1 packet by mouth daily.  . QUEtiapine (SEROQUEL) 25 MG tablet Take 1 tablet (25 mg total) by mouth at bedtime. (Patient not taking: Reported on 05/09/2019)  . torsemide (DEMADEX) 20 MG tablet Take 10 mg by mouth every other day.    No facility-administered encounter medications on file as of 05/26/2019.    PHYSICAL EXAM / ROS:   Current and past weights: 176 lbs, past weights have been around 188 lbs. This is a 13 lbs weight loss, although there is some edema. General: NAD, frail appearing, WNWD Cardiovascular: no chest pain reported, 1+ edema,  Pulmonary: no cough, no increased SOB, room air Abdomen: appetite varied in quanitity, denies constipation, incontinent of bowel, Abdomen soft, non tender as assessed by SNF RN GU: denies dysuria, incontinent of urine MSK:  no joint deformities,  Non-ambulatory, in  Wc, / fell yesterday Skin: no rashes or wounds reported Neurological: Weakness, has just been moved to a new room and is agitated. Denies pain. Verbal, a and o x 1.   Jason Coop, NP [facility]

## 2019-06-10 ENCOUNTER — Non-Acute Institutional Stay: Payer: Medicare PPO | Admitting: Primary Care

## 2019-06-10 ENCOUNTER — Other Ambulatory Visit: Payer: Self-pay

## 2019-06-10 DIAGNOSIS — Z515 Encounter for palliative care: Secondary | ICD-10-CM

## 2019-06-10 NOTE — Progress Notes (Signed)
[employer] [facility] Palliative Care Consult Note Telephone: [phone]  Fax: [phone]  TELEHEALTH VISIT STATEMENT Due to the COVID-19 crisis, this visit was done via telemedicine from my office. It was initiated and consented to by this patient and/or family.  PATIENT NAME: Gary Harmon [address] [place] [id number] [phone] (home)  DOB: Oct 01, 1940 MRN: [id number]  PRIMARY CARE PROVIDER:   Juluis Pitch, MD, [address] [place] [address] [phone]  REFERRING PROVIDER:  Juluis Pitch, MD [address] [place],  [place] [address] [phone]  RESPONSIBLE PARTY:   Extended Emergency Contact Information Primary Emergency Contact: Kapfer,Betty Address: [address]          [place], [place] [address] Johnnette Litter of [place] Mobile Phone: [phone] Relation: Spouse Secondary Emergency Contact: Brooke Dare Mobile Phone: [phone] Relation: Daughter   ASSESSMENT AND RECOMMENDATIONS:   1. Advance Care Planning/Goals of Care: Goals include to maximize quality of life and symptom management.MOST on file states DNR, comfort measures, no antibiotics, no iv fluids, no feeding tube. Goals of care include comfort care, supportive care. I called Mrs. Rosana Hoes RE visit. We discussed his decline, and especially weight loss.  2. Symptom Management:   Constipation: Not reported, good bowel function on current bowel program.  Pain: No pain due to scheduled acetaminophen.  Dementia: Advanced dementia with rapid decline. Neurology appt made for 3/10.Psych NP increased depakote and trazadone at hs for hs sleep. Seroquel d/c'ed. Appears to be sundowning, PRN lorazepam also ordered for agitation. Wife states he is usually asleep or unaware of their visits during the day. Hope is to get him on a diurnal schedule.   Nutrition: Must be fed. Has continued to lose weight, 8 lbs in 2 weeks. Recommend compassionate care visit by wife to assist with feeding. She has  brought in food but is not sure he got it.  She states he is a slow eater and has thickened liquids now as well. Wife has had vaccine and can get covid testing to enter.   Level of care: She is not certain about level of care now. He is out of quarantine but she is not sure if he is in a LTC bed yet.  We are continuing to discuss hospice. However I would recommend a trial of Mrs. Morelan being able to feed to see if intake is improved.   3. Family /Caregiver/Comunity Supports: Wife and family live locally, supportive. Current patient in LTC/SNF.  4. Cognitive / Functional decline: Cognitive decline significant over 8-12 weeks. Unable to do adls or iadls.  5. Follow up Palliative Care Visit: Palliative care will continue to follow for goals of care clarification and symptom management. Return 4 weeks or prn.  I spent 35 minutes providing this consultation,  from 1000 to 1035. More than 50% of the time in this consultation was spent coordinating communication.   HISTORY OF PRESENT ILLNESS:  Gary Harmon is a 79 y.o. year old male with multiple medical problems including CHF, dementia, abnormal weight loss. Palliative Care was asked to follow this patient by consultation request of Juluis Pitch, MD to help address advance care planning and goals of care. This is a follow up visit.  CODE STATUS: MOST on file states DNR, comfort measures, no antibiotics, no iv fluids, no feeding tube  PPS: 30% HOSPICE ELIGIBILITY/DIAGNOSIS: TBD  PAST MEDICAL HISTORY:  Past Medical History:  Diagnosis Date  . CHF (congestive heart failure) ([place])   . Gout   . Hypertension   .  Rheumatic fever     SOCIAL HX:  Social History   Tobacco Use  . Smoking status: Never Smoker  . Smokeless tobacco: Never Used  Substance Use Topics  . Alcohol use: No    Alcohol/week: 0.0 standard drinks    ALLERGIES: No Known Allergies   PERTINENT MEDICATIONS: Per SNF record  PHYSICAL EXAM / ROS:   Current and past weights:  168.1, daily wt. 8 lb wt loss in 2 weeks.  General: NAD, frail appearing, denies pain Cardiovascular: no chest pain reported, no edema reported Pulmonary: no cough, no increased SOB, room air Abdomen: appetite variable, must be fed, denies constipation GU: denies dysuria, incontinent of urine MSK:  no joint deformities, non ambulatory, [place] to w/c. Fall x 1. Skin: no rashes or wounds reported Neurological: Weakness, up in geri chair, flat affect, occ verbal, Psych addressing agitation    Jason Coop, NP, [facility]

## 2019-06-14 ENCOUNTER — Emergency Department
Admission: EM | Admit: 2019-06-14 | Discharge: 2019-06-16 | Disposition: A | Discharge status: Home / Self Care | Payer: Medicare PPO | Attending: Emergency Medicine | Admitting: Emergency Medicine

## 2019-06-14 ENCOUNTER — Other Ambulatory Visit: Payer: Self-pay

## 2019-06-14 ENCOUNTER — Emergency Department: Payer: Medicare PPO

## 2019-06-14 ENCOUNTER — Encounter: Payer: Self-pay | Admitting: Emergency Medicine

## 2019-06-14 DIAGNOSIS — R4182 Altered mental status, unspecified: Secondary | ICD-10-CM | POA: Insufficient documentation

## 2019-06-14 DIAGNOSIS — I509 Heart failure, unspecified: Secondary | ICD-10-CM | POA: Insufficient documentation

## 2019-06-14 DIAGNOSIS — I110 Hypertensive heart disease with heart failure: Secondary | ICD-10-CM | POA: Diagnosis not present

## 2019-06-14 DIAGNOSIS — Z7982 Long term (current) use of aspirin: Secondary | ICD-10-CM | POA: Insufficient documentation

## 2019-06-14 DIAGNOSIS — Z79899 Other long term (current) drug therapy: Secondary | ICD-10-CM | POA: Diagnosis not present

## 2019-06-14 DIAGNOSIS — F0390 Unspecified dementia without behavioral disturbance: Secondary | ICD-10-CM | POA: Insufficient documentation

## 2019-06-14 DIAGNOSIS — Z515 Encounter for palliative care: Secondary | ICD-10-CM | POA: Diagnosis not present

## 2019-06-14 DIAGNOSIS — Z66 Do not resuscitate: Secondary | ICD-10-CM | POA: Insufficient documentation

## 2019-06-14 DIAGNOSIS — R6889 Other general symptoms and signs: Secondary | ICD-10-CM

## 2019-06-14 LAB — CBC WITH DIFFERENTIAL/PLATELET
Abs Immature Granulocytes: 0.07 10*3/uL (ref 0.00–0.07)
Basophils Absolute: 0.1 10*3/uL (ref 0.0–0.1)
Basophils Relative: 1 %
Eosinophils Absolute: 0.1 10*3/uL (ref 0.0–0.5)
Eosinophils Relative: 1 %
HCT: 49.1 % (ref 39.0–52.0)
Hemoglobin: 14.9 g/dL (ref 13.0–17.0)
Immature Granulocytes: 1 %
Lymphocytes Relative: 5 %
Lymphs Abs: 0.5 10*3/uL — ABNORMAL LOW (ref 0.7–4.0)
MCH: 28.7 pg (ref 26.0–34.0)
MCHC: 30.3 g/dL (ref 30.0–36.0)
MCV: 94.6 fL (ref 80.0–100.0)
Monocytes Absolute: 0.4 10*3/uL (ref 0.1–1.0)
Monocytes Relative: 4 %
Neutro Abs: 7.6 10*3/uL (ref 1.7–7.7)
Neutrophils Relative %: 88 %
Platelets: 54 10*3/uL — ABNORMAL LOW (ref 150–400)
RBC: 5.19 MIL/uL (ref 4.22–5.81)
RDW: 23.1 % — ABNORMAL HIGH (ref 11.5–15.5)
Smear Review: DECREASED
WBC: 8.6 10*3/uL (ref 4.0–10.5)
nRBC: 0.5 % — ABNORMAL HIGH (ref 0.0–0.2)

## 2019-06-14 LAB — BLOOD GAS, VENOUS
Patient temperature: 37
pCO2, Ven: 43 mmHg — ABNORMAL LOW (ref 44.0–60.0)
pH, Ven: 7.42 (ref 7.250–7.430)

## 2019-06-14 LAB — COMPREHENSIVE METABOLIC PANEL
ALT: 23 U/L (ref 0–44)
AST: 24 U/L (ref 15–41)
Albumin: 2.7 g/dL — ABNORMAL LOW (ref 3.5–5.0)
Alkaline Phosphatase: 78 U/L (ref 38–126)
Anion gap: 7 (ref 5–15)
BUN: 83 mg/dL — ABNORMAL HIGH (ref 8–23)
CO2: 32 mmol/L (ref 22–32)
Calcium: 9.4 mg/dL (ref 8.9–10.3)
Chloride: 122 mmol/L — ABNORMAL HIGH (ref 98–111)
Creatinine, Ser: 2.94 mg/dL — ABNORMAL HIGH (ref 0.61–1.24)
GFR calc Af Amer: 23 mL/min — ABNORMAL LOW (ref >60)
GFR calc non Af Amer: 20 mL/min — ABNORMAL LOW (ref >60)
Glucose, Bld: 47 mg/dL — ABNORMAL LOW (ref 70–99)
Potassium: 4.2 mmol/L (ref 3.5–5.1)
Sodium: 161 mmol/L (ref 135–145)
Total Bilirubin: 2.4 mg/dL — ABNORMAL HIGH (ref 0.3–1.2)
Total Protein: 6.4 g/dL — ABNORMAL LOW (ref 6.5–8.1)

## 2019-06-14 LAB — LACTIC ACID, PLASMA: Lactic Acid, Venous: 2.8 mmol/L (ref 0.5–1.9)

## 2019-06-14 LAB — BRAIN NATRIURETIC PEPTIDE: B Natriuretic Peptide: 1533 pg/mL — ABNORMAL HIGH (ref 0.0–100.0)

## 2019-06-14 MED ORDER — LOPERAMIDE HCL 2 MG PO CAPS: 2.0000 mg | ORAL_CAPSULE | Freq: Three times a day (TID) | ORAL | Status: DC | PRN

## 2019-06-14 MED ORDER — SODIUM CHLORIDE 0.9 % IV SOLN: INTRAVENOUS | Status: DC

## 2019-06-14 MED ORDER — LORAZEPAM 2 MG/ML PO CONC
0.5000 mg | ORAL | Status: DC | PRN
  Filled 2019-06-14 (×2): qty 0.25

## 2019-06-14 MED ORDER — DEXTROSE 50 % IV SOLN
25.0000 mL | Freq: Once | INTRAVENOUS | Status: AC
  Administered 2019-06-14: 25 mL via INTRAVENOUS
  Filled 2019-06-14: qty 50

## 2019-06-14 MED ORDER — LORAZEPAM 2 MG/ML IJ SOLN
INTRAMUSCULAR | Status: AC
  Filled 2019-06-14: qty 1

## 2019-06-14 MED ORDER — QUETIAPINE FUMARATE 25 MG PO TABS
25.0000 mg | ORAL_TABLET | Freq: Every day | ORAL | Status: DC
  Filled 2019-06-14: qty 1

## 2019-06-14 MED ORDER — PRAVASTATIN SODIUM 10 MG PO TABS
10.0000 mg | ORAL_TABLET | Freq: Every day | ORAL | Status: DC
  Filled 2019-06-14 (×3): qty 1

## 2019-06-14 MED ORDER — MORPHINE SULFATE (PF) 2 MG/ML IV SOLN
2.0000 mg | INTRAVENOUS | Status: DC | PRN
  Administered 2019-06-14 – 2019-06-15 (×5): 2 mg via INTRAVENOUS
  Filled 2019-06-14 (×5): qty 1

## 2019-06-14 MED ORDER — DIVALPROEX SODIUM 125 MG PO CSDR
250.0000 mg | DELAYED_RELEASE_CAPSULE | Freq: Three times a day (TID) | ORAL | Status: DC
  Filled 2019-06-14 (×7): qty 2

## 2019-06-14 MED ORDER — TRAZODONE HCL 50 MG PO TABS
50.0000 mg | ORAL_TABLET | Freq: Every day | ORAL | Status: DC
  Filled 2019-06-14: qty 1

## 2019-06-14 MED ORDER — ASPIRIN EC 81 MG PO TBEC
81.0000 mg | DELAYED_RELEASE_TABLET | Freq: Every day | ORAL | Status: DC
  Filled 2019-06-14: qty 1

## 2019-06-14 MED ORDER — CITALOPRAM HYDROBROMIDE 20 MG PO TABS
10.0000 mg | ORAL_TABLET | Freq: Every day | ORAL | Status: DC
  Filled 2019-06-14: qty 1

## 2019-06-14 MED ORDER — MORPHINE SULFATE (CONCENTRATE) 10 MG/0.5ML PO SOLN
5.0000 mg | ORAL | Status: DC | PRN
  Administered 2019-06-16: 5 mg via ORAL
  Filled 2019-06-14: qty 0.5

## 2019-06-14 MED ORDER — LORAZEPAM 2 MG/ML IJ SOLN
0.5000 mg | Freq: Once | INTRAMUSCULAR | Status: AC
  Administered 2019-06-14: 0.5 mg via INTRAVENOUS
  Filled 2019-06-14: qty 1

## 2019-06-14 MED ORDER — DEXTROSE-NACL 5-0.45 % IV SOLN
500.0000 mL | Freq: Once | INTRAVENOUS | Status: AC
  Administered 2019-06-14: 500 mL via INTRAVENOUS

## 2019-06-14 MED ORDER — SENNA 8.6 MG PO TABS
1.0000 | ORAL_TABLET | Freq: Two times a day (BID) | ORAL | Status: DC
  Filled 2019-06-14 (×2): qty 1

## 2019-06-14 MED ORDER — LORAZEPAM 2 MG/ML IJ SOLN
0.5000 mg | Freq: Once | INTRAMUSCULAR | Status: AC
  Administered 2019-06-14: 0.5 mg via INTRAVENOUS

## 2019-06-14 MED ORDER — MORPHINE SULFATE (CONCENTRATE) 10 MG/0.5ML PO SOLN
5.0000 mg | ORAL | Status: DC | PRN
  Administered 2019-06-14 (×3): 5 mg via SUBLINGUAL
  Filled 2019-06-14 (×2): qty 0.5

## 2019-06-14 MED ORDER — LORAZEPAM 0.5 MG PO TABS: 0.5000 mg | ORAL_TABLET | ORAL | Status: DC | PRN

## 2019-06-14 NOTE — ED Notes (Signed)
Mouth swabs and moisturizer given.

## 2019-06-14 NOTE — TOC Progression Note (Signed)
Transition of Care [facility]) - Progression Note    Patient Details  Name: Gary Harmon MRN: [id number] Date of Birth: Aug 29, 1940  [facility]) CM/SW Contact  Anselm Pancoast, RN Phone Number: 06/14/2019, 3:20 PM  Clinical Narrative:    Notified [facility] is not offering bed at this time due to not feeling patient is Hospice appropriate. RN CM discussed in detail with nurse and MD and outreached to Santiago Glad with [place] who has been working with family. Santiago Glad is planning to talk with family later today regarding discharge options.    Expected Discharge Plan: [place]    Expected Discharge Plan and Services Expected Discharge Plan: [place]       Living arrangements for the past 2 months: [place]                                       Social Determinants of Health (SDOH) Interventions    Readmission Risk Interventions No flowsheet data found.

## 2019-06-14 NOTE — ED Notes (Signed)
Family at bedside.  [facility] from [facility].  Offered chaplain services to family if needed. Wife is up in surgery at this time.  Pt unlabored currently with VSS.  No labs or CBG done per family wishes.

## 2019-06-14 NOTE — ED Triage Notes (Signed)
Pt sent from [facility] for [facility].  Pt has current UTI, no abx r/t comfort only measures.  Per EMS CBG 66 and BP systolic in 0000000. Pt not opening eyes at this time.  Pt moving around in bed.

## 2019-06-14 NOTE — TOC Initial Note (Signed)
Transition of Care [facility]) - Initial/Assessment Note    Patient Details  Name: Gary Harmon MRN: [id number] Date of Birth: 31-Aug-1940  Transition of Care [facility]) CM/SW Contact:    Anselm Pancoast, RN Phone Number: 06/14/2019, 1:41 PM  Clinical Narrative:                 Family agitated and nonverbal with family at bedside. Patient is comfort care with DNR and MOST form in place. Family requesting placement at inpatient hospice. Confirmed availability at [facility].    Expected Discharge Plan: [place]     Patient Goals and CMS Choice Patient states their goals for this hospitalization and ongoing recovery are:: comfort measures only-family requesting Hospice home placement      Expected Discharge Plan and Services Expected Discharge Plan: [place]       Living arrangements for the past 2 months: [place]                                      Prior Living Arrangements/Services Living arrangements for the past 2 months: [place] Lives with:: Facility Resident Patient language and need for interpreter reviewed:: Yes Do you feel safe going back to the place where you live?: No   family requesting Hospice  Need for Family Participation in Patient Care: Yes (Comment) Care giver support system in place?: Yes (comment) Current home services: DME Criminal Activity/Legal Involvement Pertinent to Current Situation/Hospitalization: No - Comment as needed  Activities of Daily Living      Permission Sought/Granted                  Emotional Assessment Appearance:: Appears stated age Attitude/Demeanor/Rapport: Unresponsive Affect (typically observed): Agitated   Alcohol / Substance Use: Never Used Psych Involvement: No (comment)  Admission diagnosis:  AMS Patient Active Problem List   Diagnosis Date Noted  . Cardiorespiratory arrest ([place]) 08/25/2018  . Acute CHF ([place]) 07/10/2015   PCP:  Juluis Pitch, MD Pharmacy:   [place], [place] - [address] [place] [address] Phone: [phone] Fax: [phone]     Social Determinants of Health (SDOH) Interventions    Readmission Risk Interventions No flowsheet data found.

## 2019-06-14 NOTE — ED Notes (Signed)
PAtient pulled up in bed and given warm blankets

## 2019-06-14 NOTE — TOC Progression Note (Signed)
Transition of Care [facility]) - Progression Note    Patient Details  Name: Gary Harmon MRN: [id number] Date of Birth: Sep 07, 1940  Transition of Care [facility]) CM/SW Contact  Anselm Pancoast, RN Phone Number: 06/14/2019, 4:49 PM  Clinical Narrative:    Family talking with hospice nurse about other options for discharge. Patient was private pay at [facility] and family is okay with transfer to another LTC facility with hospice services. Will review after labs and chest xray completed.    Expected Discharge Plan: [place]    Expected Discharge Plan and Services Expected Discharge Plan: [place]       Living arrangements for the past 2 months: [place]                                       Social Determinants of Health (SDOH) Interventions    Readmission Risk Interventions No flowsheet data found.
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
## 2019-06-14 NOTE — ED Notes (Signed)
Pt pulled up in bed and assisted to be comfortable. Pt family content at bedside, pt appears to be more comfortable at this time.

## 2019-06-14 NOTE — Progress Notes (Signed)
[facility] Liaison note: Notified by ED TOC Andreas Newport of family interest in [place] inpatient unit. Patient was reviewed by [facility] and felt at this time, based on information available in Epic, not to be appropriate for the Inpatient unit. Writer spoke with family in the patient's room to discuss the difference between inpateint hospice and hospice at home/long term care facility. Family did voice understanding. Thier main goal is that he be comfortable and that he NOT return to [facility]. They have chosen, after much discussion to have basic labs drawn and a chest xray in the hopes to better determine patient's prognosis. No treatment is planned at this time, again their focus is comfort. Family is open to having a bed search done for another LONG term care facility. Patient is currently Medicaid pending and family has been paying out of pocket for the bed at [facility]. TOC Andreas Newport, EDP Dr. Ellender Hose and staff RN Chrissy all aware. Writer to follow up in the morning.  Manuela Schwartz to start bed search in the morning, family aware. Patient appeared restless through out the visit, he has received oral liquid morphine and will receive a PRN dose of lorazepam, family in agreement with symptom management. Thank you. Flo Shanks BSN, RN, [place] [phone]

## 2019-06-14 NOTE — ED Notes (Signed)
Pt sleeping. Family remains at bedside.

## 2019-06-14 NOTE — ED Notes (Signed)
Pt repositioned in bed and made more comfortable by this nurse and the son at bedside. Pt appears more comfortable at this time, patient is clean/dry and has not urinated/had a bowel movement since this nurse has been here at 3 am.

## 2019-06-14 NOTE — ED Notes (Signed)
This nurse attempted to give patient pills with applesauce. Pt unable to tolerate at this time, son notified and Dr. Juliet Rude notified. Dr. Juliet Rude confirms its okay to not give them at this time.

## 2019-06-14 NOTE — ED Notes (Signed)
Pt resting at this time. Val, RN gave this nurse bedside report and introduced this nurse to family. Pt repositioned in bed and pulled up. Pt appears comfortable at this time, family denies needs.

## 2019-06-14 NOTE — ED Provider Notes (Signed)
I assumed care for this patient.  Briefly, the patient is a 79 year old with severe dementia, currently on outpatient [facility] at [facility], with MOST form in place.  Family decided to bring him here due to concerns with the care he was getting at [facility], as well as desire for transfer to a new facility versus inpatient hospice.  The patient is no longer eating and drinking.  He has had significant functional decline.  Initially, plan was to attempt outpatient hospice without having to obtain any lab work or imaging.  However, based on discussion with hospice RN here, he does not currently meet criteria for inpatient hospice, and would not be able to be placed at this time without lab work or objective evidence of his decline.  Discussed with family.  Will check some screening labs, continue symptomatic care, and reassess.  Lab work shows significant dehydration with hypernatremia, acute on chronic kidney injury, and hyperglycemia.  Hypoalbuminemia noted.  This is likely all secondary to his progressive decline and decreased p.o. intake.  As mentioned in Dr. Ashok Croon note and based on discussion with family, he would not elect for life-prolonging treatment such as antibiotics.  However, I do suspect that some of his mentation could be impacted by his dehydration.  After a long discussion with family, will give cautious fluids.  Current plan is to place him to a new nursing facility as he cannot return to [facility].  Per discussion with hospice, he does not meet 2-week criteria, but will be discharged with outpatient hospice, which family is in agreement with.   Duffy Bruce, MD 06/14/19 1910

## 2019-06-14 NOTE — ED Notes (Signed)
Long time spent with family answering questions and explaining different plan of care options explained previously by Dr. Joan Mayans.

## 2019-06-15 LAB — GLUCOSE, CAPILLARY: Glucose-Capillary: 85 mg/dL (ref 70–99)

## 2019-06-15 MED ORDER — LORAZEPAM 2 MG/ML IJ SOLN: 0.5000 mg | INTRAMUSCULAR | Status: DC

## 2019-06-15 MED ORDER — MORPHINE SULFATE (PF) 2 MG/ML IV SOLN
2.0000 mg | Freq: Four times a day (QID) | INTRAVENOUS | Status: DC | PRN
  Administered 2019-06-16: 2 mg via INTRAVENOUS
  Filled 2019-06-15 (×2): qty 1

## 2019-06-15 MED ORDER — LORAZEPAM 2 MG/ML IJ SOLN
0.5000 mg | INTRAMUSCULAR | Status: DC | PRN
  Administered 2019-06-15 – 2019-06-16 (×3): 0.5 mg via INTRAVENOUS
  Filled 2019-06-15 (×3): qty 1

## 2019-06-15 NOTE — TOC Progression Note (Addendum)
Transition of Care [facility]) - Progression Note    Patient Details  Name: Gary Harmon MRN: [id number] Date of Birth: Jun 15, 1940  Transition of Care [facility]) CM/SW Contact  Anselm Pancoast, RN Phone Number: 06/15/2019, 4:43 PM  Clinical Narrative:    Spoke with son who advised [facility] is no longer an option due to financial cost of $9300/month. Reviewed possible options outside of [facility]. Hospice continues to evaluate daily. FL2 sent out to facilities for possible LTC placement with Hospice services. Left voicemail updating that family was going in a different direction due to cost. RN CM requested hospital bed in place of ED stretcher for patient comfort. RN notified.    Expected Discharge Plan: [place]    Expected Discharge Plan and Services Expected Discharge Plan: [place]       Living arrangements for the past 2 months: [place]                                       Social Determinants of Health (SDOH) Interventions    Readmission Risk Interventions No flowsheet data found.

## 2019-06-15 NOTE — TOC Progression Note (Signed)
Transition of Care [facility]) - Progression Note    Patient Details  Name: Nahiem Mahoney MRN: [id number] Date of Birth: 10-30-1940  Transition of Care [facility]) CM/SW [place], RN Phone Number: 06/15/2019, 9:41 AM  Clinical Narrative:    Damaris Schooner to Seth Bake @ [facility] regarding possible LTC placement with Hospice services. Advised patient must have had both COVID vaccines to be considered but they would consider for placement. RN CM left message for Otila Kluver requesting confirmation of COVID vaccines. Left VM for son to verify COVID vaccines.    Expected Discharge Plan: [place]    Expected Discharge Plan and Services Expected Discharge Plan: [place]       Living arrangements for the past 2 months: [place]                                       Social Determinants of Health (SDOH) Interventions    Readmission Risk Interventions No flowsheet data found.

## 2019-06-15 NOTE — TOC Progression Note (Signed)
Transition of Care [facility]) - Progression Note    Patient Details  Name: Gary Harmon MRN: [id number] Date of Birth: [birth year]  Transition of Care [facility]) CM/SW [place], RN Phone Number: 06/15/2019, 12:06 PM  Clinical Narrative:    Confirmed with Otila Kluver patient has completed COVID vaccines with [facility] on 1/7 and 1/28. Spoke with wife at patient bedside. Wife states they are working with Kennyth Lose @ [facility] for medicaid completion but currently have about one month of care for private pay left in savings.    Expected Discharge Plan: [place]    Expected Discharge Plan and Services Expected Discharge Plan: [place]       Living arrangements for the past 2 months: [place]                                       Social Determinants of Health (SDOH) Interventions    Readmission Risk Interventions No flowsheet data found.

## 2019-06-15 NOTE — ED Provider Notes (Signed)
Vitals:   06/15/19 0000 06/15/19 0015  BP:    Pulse:    Resp: 20 18  Temp:    SpO2:      Checked in with patient, family at bedside.  Patient does appear slightly uncomfortable, moaning slightly, discussed with nursing and providing pain medication and relief.     Delman Kitten, MD 06/15/19 [phone]

## 2019-06-15 NOTE — ED Notes (Signed)
Pts lips noted to be chapped, lubricant applied. Pt resting in bed at this time.

## 2019-06-15 NOTE — ED Notes (Signed)
Daughter spoke with this RN, said she was going to head home for the night. RN acknowledged and told her that we would give wife a call if there were any changes in care.

## 2019-06-15 NOTE — ED Notes (Signed)
Pt given warm blanket and repositioned in bed, Son at bedside given blanket. Denies any other needs.

## 2019-06-15 NOTE — ED Notes (Signed)
Pt brief changed at this time, urine occurrence

## 2019-06-15 NOTE — ED Notes (Signed)
MD Quale notified about pt not having diet order, hospice rn to assess pt in morning. No orders at this time. Pt remains lethargic and unable to tolerate PO meds.

## 2019-06-15 NOTE — ED Notes (Signed)
Pt oxygen saturation noted to be 87% on room air. Pt placed on 2L nasal cannula. Pt oxygen saturation now 93% on 2L. MD Siadecki aware.

## 2019-06-15 NOTE — NC FL2 (Signed)
[place] LEVEL OF CARE SCREENING TOOL     IDENTIFICATION  Patient Name: Gary Harmon Birthdate: 02/18/1941 Sex: male Admission Date (Current Location): 06/14/2019  [facility] and [place] Number:  Engineering geologist and Address:         Provider Number: [id number]  Attending Physician Name and Address:  No att. providers found  Relative Name and Phone Number:       Current Level of Care: Hospital Recommended Level of Care: [place] Prior Approval Number:    Date Approved/Denied:   PASRR Number:    Discharge Plan: Other (Comment)(Seeking LTC with Hospice services)    Current Diagnoses: Patient Active Problem List   Diagnosis Date Noted  . Cardiorespiratory arrest ([place]) 08/25/2018  . Acute CHF ([place]) 07/10/2015    Orientation RESPIRATION BLADDER Height & Weight        Normal Incontinent Weight:   Height:     BEHAVIORAL SYMPTOMS/MOOD NEUROLOGICAL BOWEL NUTRITION STATUS      Incontinent Diet(Not eating)  AMBULATORY STATUS COMMUNICATION OF NEEDS Skin   Total Care Does not communicate Normal                       Personal Care Assistance Level of Assistance  Total care Bathing Assistance: Maximum assistance Feeding assistance: Maximum assistance Dressing Assistance: Maximum assistance Total Care Assistance: Maximum assistance   Functional Limitations Info             SPECIAL CARE FACTORS FREQUENCY                       Contractures      Additional Factors Info  Code Status Code Status Info: DNR             Current Medications (06/15/2019):  This is the current hospital active medication list Current Facility-Administered Medications  Medication Dose Route Frequency Provider Last Rate Last Admin  . aspirin EC tablet 81 mg  81 mg Oral Daily Duffy Bruce, MD      . citalopram (CELEXA) tablet 10 mg  10 mg Oral Daily Duffy Bruce, MD      . divalproex (DEPAKOTE SPRINKLE) capsule 250 mg  250 mg Oral TID  Duffy Bruce, MD      . loperamide (IMODIUM) capsule 2 mg  2 mg Oral TID PRN Duffy Bruce, MD      . LORazepam (ATIVAN) injection 0.5 mg  0.5 mg Intravenous Q4H PRN Delman Kitten, MD   0.5 mg at 06/15/19 0522  . morphine 2 MG/ML injection 2 mg  2 mg Intravenous Q2H PRN Duffy Bruce, MD   2 mg at 06/15/19 0826  . morphine CONCENTRATE 10 MG/0.5ML oral solution 5 mg  5 mg Oral Q2H PRN Lilia Pro., MD       Or  . morphine CONCENTRATE 10 MG/0.5ML oral solution 5 mg  5 mg Sublingual Q2H PRN Lilia Pro., MD   5 mg at 06/14/19 2010  . pravastatin (PRAVACHOL) tablet 10 mg  10 mg Oral Daily Duffy Bruce, MD      . QUEtiapine (SEROQUEL) tablet 25 mg  25 mg Oral QHS Duffy Bruce, MD      . senna [facility]) tablet 8.6 mg  1 tablet Oral BID Duffy Bruce, MD      . traZODone (DESYREL) tablet 50 mg  50 mg Oral Gloriajean Dell, MD       Current Outpatient Medications  Medication Sig Dispense Refill  .  acetaminophen (TYLENOL) 325 MG tablet Take 650 mg by mouth every 6 (six) hours.     . ALPRAZolam (XANAX) 0.5 MG tablet Take 1 tablet (0.5 mg total) by mouth at bedtime as needed for sleep. (Patient taking differently: Take 0.5 mg by mouth 3 (three) times daily as needed for anxiety. ) 20 tablet 0  . ALPRAZolam (XANAX) 0.5 MG tablet Take 0.5 mg by mouth See admin instructions. Give 1 tablet (0.5mg ) by mouth after 9PM if needed for restlessness or agitation    . aspirin EC 81 MG tablet Take 81 mg by mouth daily.    . carvedilol (COREG) 3.125 MG tablet Take 1 tablet (3.125 mg total) by mouth 2 (two) times daily with a meal. 60 tablet 0  . Cholecalciferol (VITAMIN D) 50 MCG (2000 UT) tablet Take 2,000 Units by mouth daily.    . citalopram (CELEXA) 20 MG tablet Take 10 mg by mouth daily.     . divalproex (DEPAKOTE SPRINKLE) 125 MG capsule Take 250 mg by mouth 3 (three) times daily.    Marland Kitchen ENTRESTO 24-26 MG Take 1 tablet by mouth 2 (two) times daily.    . fluticasone (FLONASE) 50 MCG/ACT nasal  spray Place 1 spray into both nostrils daily.     . fluticasone (FLONASE) 50 MCG/ACT nasal spray Place 1 spray into both nostrils daily as needed for allergies or rhinitis.    Marland Kitchen loperamide (IMODIUM) 2 MG capsule Take 2 mg by mouth 3 (three) times daily as needed for diarrhea or loose stools.    . metolazone (ZAROXOLYN) 2.5 MG tablet Take 2.5 mg by mouth every other day.    . nitrofurantoin, macrocrystal-monohydrate, (MACROBID) 100 MG capsule Take 100 mg by mouth 2 (two) times daily.    Marland Kitchen nystatin cream (MYCOSTATIN) Apply 1 application topically 2 (two) times daily.    . polyethylene glycol (MIRALAX / GLYCOLAX) 17 g packet Take 17 g by mouth daily as needed for moderate constipation.    . pravastatin (PRAVACHOL) 10 MG tablet Take 10 mg by mouth daily.     . psyllium (HYDROCIL/METAMUCIL) 95 % PACK Take 1 packet by mouth daily.    . QUEtiapine (SEROQUEL) 25 MG tablet Take 1 tablet (25 mg total) by mouth at bedtime. (Patient taking differently: Take 25 mg by mouth at bedtime as needed (agitation). ) 30 tablet 1  . senna (SENOKOT) 8.6 MG TABS tablet Take 1 tablet by mouth in the morning and at bedtime.    Marland Kitchen therapeutic multivitamin-minerals (THERAGRAN-M) tablet Take 1 tablet by mouth daily.    Marland Kitchen torsemide (DEMADEX) 20 MG tablet Take 10 mg by mouth every other day.     . traZODone (DESYREL) 50 MG tablet Take 50 mg by mouth at bedtime.    . Zinc Oxide 25 % PSTE Apply 1 application topically 3 (three) times daily. (apply to sacrum)       Discharge Medications: Please see discharge summary for a list of discharge medications.  Relevant Imaging Results:  Relevant Lab Results:   Additional Information SS#[id number]  Anselm Pancoast, RN

## 2019-06-15 NOTE — ED Notes (Signed)
Son at bedside.

## 2019-06-15 NOTE — ED Notes (Signed)
Pt moved to hospital bed for comfort. Pt brief changed and pt repositioned in bed. Pt still appears to be resting comfortably at this time. Pt respirations even and unlabored.

## 2019-06-15 NOTE — ED Notes (Signed)
Pt brief checked by this RN. Pt dry at this time. Pt repositioned in bed at this time by this RN and Sam,RN. Pt still resting comfortably at this time.

## 2019-06-15 NOTE — ED Notes (Signed)
Patient resting comfortably in bed. Wife at bedside.

## 2019-06-15 NOTE — TOC Progression Note (Signed)
Transition of Care [facility]) - Progression Note    Patient Details  Name: Gary Harmon MRN: [id number] Date of Birth: [birth year]  Transition of Care [facility]) CM/SW [place], RN Phone Number: 06/15/2019, 9:28 AM  Clinical Narrative:    Damaris Schooner to Otila Kluver @ [facility] to verify if patient had COVID vaccine. Will outreach to other LTC facilities for possible placement with home hospice services.    Expected Discharge Plan: [place]    Expected Discharge Plan and Services Expected Discharge Plan: [place]       Living arrangements for the past 2 months: [place]                                       Social Determinants of Health (SDOH) Interventions    Readmission Risk Interventions No flowsheet data found.

## 2019-06-16 NOTE — ED Notes (Signed)
Pt resting comfortably at this time. Pt does not need to be changed at this time. Family at bedside, denies needs.

## 2019-06-16 NOTE — TOC Transition Note (Signed)
Transition of Care [facility]) - CM/SW Discharge Note   Patient Details  Name: Gary Harmon MRN: [id number] Date of Birth: 1940/04/23  Transition of Care [facility]) CM/SW Contact:  Anselm Pancoast, RN Phone Number: 06/16/2019, 12:05 PM   Clinical Narrative:    Spoke with Santiago Glad who advised everything was complete and she would be calling report and EMS shortly for discharge to [facility]          Patient Goals and CMS Choice Patient states their goals for this hospitalization and ongoing recovery are:: comfort measures only-family requesting Hospice home placement      Discharge Placement                       Discharge Plan and Services                                     Social Determinants of Health (SDOH) Interventions     Readmission Risk Interventions No flowsheet data found.

## 2019-06-16 NOTE — ED Provider Notes (Signed)
-----------------------------------------   3:13 AM on 06/16/2019 -----------------------------------------   Blood pressure (!) 69/48, pulse 77, temperature (!) 97.5 F (36.4 C), temperature source Axillary, resp. rate (!) 23, SpO2 91 %.  The patient is on comfort care.  Social work is reportedly working on determining the Ambulance person.   Hinda Kehr, MD 06/16/19 [phone]

## 2019-06-16 NOTE — ED Notes (Signed)
Pt resting at this time, pt in NAD.

## 2019-06-16 NOTE — ED Notes (Signed)
Per [facility] care, d/c pt with IV

## 2019-06-16 NOTE — ED Provider Notes (Signed)
8:32 AM Assumed care for off going team.   Blood pressure (!) 80/53, pulse 63, temperature (!) 97.5 F (36.4 C), temperature source Axillary, resp. rate 12, SpO2 97 %.  See their HPI for full report but in brief on review of records patient initially came in DNR without wanting any fluids or antibiotics and comfort care only.  Social work was consulted and they are planning to do hospice in the home.  Patient has been in the ER now for greater than 40 hours still waiting placement.  During this stay his sodium was noted to be 161 and he had a creatinine of 2.9 and it was decided to give some fluids by prior provider.  Patient's blood pressures have gotten lower since this morning.  Nurse was asking if we should be giving more fluids.  I called patient's family discussed with the POA Ms. Robies patient's wife.  At this time she does not think that he would want any kind of measures that would prolong life and would like to focus on keeping him comfortable.  I discussed that given fluids would be prolonging it.  She stated that she is going to talk with her family and they plan to all be at the hospital around 9 AM and we can discuss further.  I also attempted to contact social worker to figure out what the plan was going to be from their perspective so would come together with a plan going forward.  Social work has not called back at this time  Had an extensive conversation with family, social work.  It sounds like he had declined prior to these abnormal sodium and kidney function.  He had normal labs in January and had already declined at that time.  I explained to them if this was an acute change and that these were labs that were new that we could correct these and it could potentially lead to him being better versus if this was just a sign of him continued decline and progressing to dying..  Family understand this and would like to hold off on further fluid and continue with comfort measures only.  They do  not feel comfortable taking patient back home.  Patient in my opinion seems that he could meet inpatient hospice given he is hypotensive with significant electrolyte abnormalities and in acute renal failure.  Patient has been accepted [place].            Vanessa [place]lawn, MD 06/16/19 [phone]

## 2019-06-16 NOTE — ED Notes (Signed)
Sacral pad placed. New depends placed but pt did not have any urine output and no BM noted.   RN attempted to feed patient applesauce but was unable to get patient to eat. Teeth brushed, mouth moisturized and chap stick applied.

## 2019-06-16 NOTE — TOC Progression Note (Signed)
Transition of Care (TOC) - Progression Note    Patient Details  Name: Gary Harmon MRN: [id number] Date of Birth: 11/20/1940  Transition of Care (TOC) CM/SW Contact   A , RN Phone Number: 06/16/2019, 9:58 AM  Clinical Narrative:    Met with family and MD at bedside to review current plan of care. Family continues to request inpatient Hospice in [place] or [place] only however currently patient has been denied inpatient Hospice and TOC is searching for LTC placement with Hospice following. ED MD advised if patient was not accepted by 3pm today he would be admitted to hospital until placement could be found. RN CM reviewed case in detail with Karen from [facility] for review again.    Expected Discharge Plan: Hospice Medical Facility    Expected Discharge Plan and Services Expected Discharge Plan: Hospice Medical Facility       Living arrangements for the past 2 months: Skilled Nursing Facility                                       Social Determinants of Health (SDOH) Interventions    Readmission Risk Interventions No flowsheet data found.  

## 2019-06-16 NOTE — ED Notes (Signed)
Resumed care of patient at this time. Pt noted to be sleeping at this time. Pt placed on CCM with call bell within reach. Pt with no other needs at this time.

## 2019-06-16 NOTE — Discharge Instructions (Addendum)
Patient is to be transported to hospice

## 2019-06-16 NOTE — Progress Notes (Signed)
[facility] Liaison note: Patient has continued to decline, now requiring oxygen at 3 liters, not eating or drinking. Family continues to want comfort focused care with no interventions planned. A bed has become available at the hospice home in [place]. Patient has been approved by hospice medical director. Writer met in the room with patient's wife, Inez Catalina and son Remo Lipps to initiate education regarding hospice services, philosophy, team approach to care and current visitation policy with understanding voiced. Family agreeable for transfer today.  Hospital care team updated.  Rapid COVID test requested and  resulted negative. Report called to the hospice home. EMS notified for transport. Thank you for the opportunity to be involved in the care of this patient and his family. Flo Shanks BSN, RN, [place] [phone]

## 2019-06-16 NOTE — ED Notes (Signed)
MD and SW at beside speaking with family about POC at this time.

## 2019-06-16 NOTE — ED Notes (Signed)
Pt unable to sign for discharge, son signed for pt to be discharged to hospice

## 2019-06-16 NOTE — TOC Transition Note (Addendum)
Transition of Care [facility]) - CM/SW Discharge Note   Patient Details  Name: Gary Harmon MRN: [id number] Date of Birth: 1941/01/29  Transition of Care [facility]) CM/SW Contact:  Anselm Pancoast, RN Phone Number: 06/16/2019, 10:30 AM   Clinical Narrative:    Spoke with Santiago Glad @ [place] states patient has been accepted for admission to inpatient Hospice. Son and RN notified. Santiago Glad will be meeting with family for admissions paperwork.          Patient Goals and CMS Choice Patient states their goals for this hospitalization and ongoing recovery are:: comfort measures only-family requesting Hospice home placement      Discharge Placement                       Discharge Plan and Services                                     Social Determinants of Health (SDOH) Interventions     Readmission Risk Interventions No flowsheet data found.

## 2019-06-24 ENCOUNTER — Telehealth: Payer: Self-pay | Admitting: Primary Care

## 2019-06-24 NOTE — Telephone Encounter (Signed)
Patient expired at [facility] on .

## 2019-07-02 ENCOUNTER — Ambulatory Visit: Payer: Self-pay | Admitting: Urology

## 2019-07-15 DEATH — deceased

## 2020-03-18 IMAGING — CT CT HEAD W/O CM
3 series · 14 of 46 positions shown, 16 images · non-contrast
Comparison: Head CT dated 04/23/2019.

CLINICAL DATA: Encephalopathy. Altered mental status.

EXAM:
CT HEAD WITHOUT CONTRAST
TECHNIQUE: Contiguous axial images were obtained from the base of the skull
through the vertex without intravenous contrast.

[Series 3: coronal soft tissue · coronal · 0.28mm/px · 3 of 67 slices shown]
[im 23/67  brain]
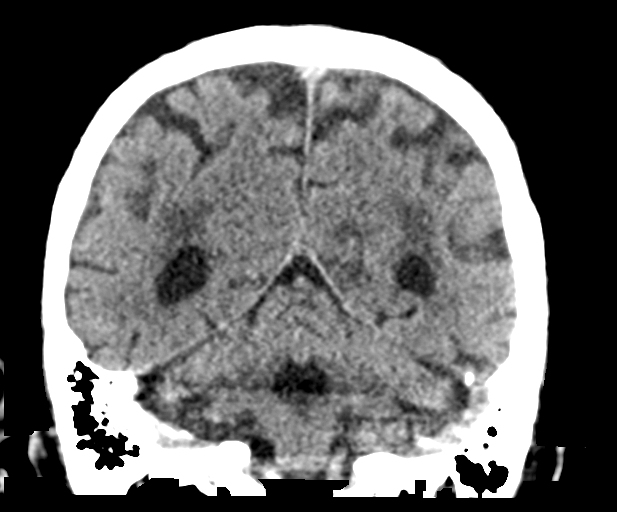
[im 30/67  brain]
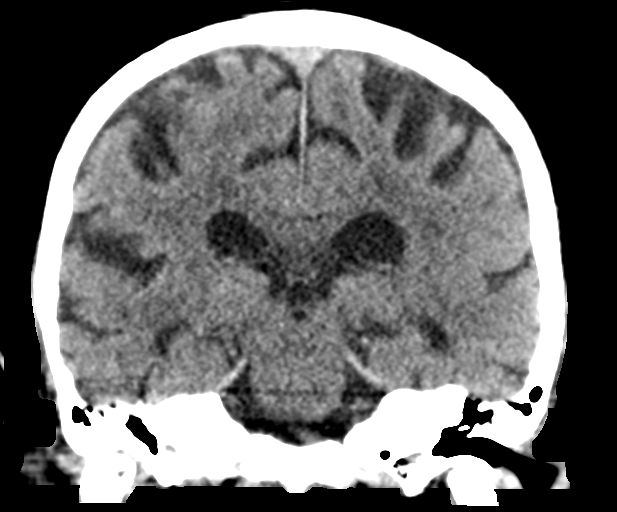
[im 37/67  brain]
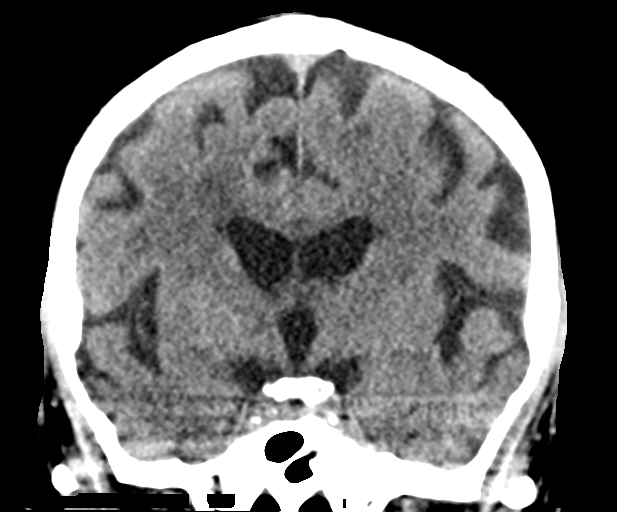

[Series 4: sagittal soft tissue · sagittal · 0.28mm/px · 3 of 58 slices shown]
[im 20/58  brain]
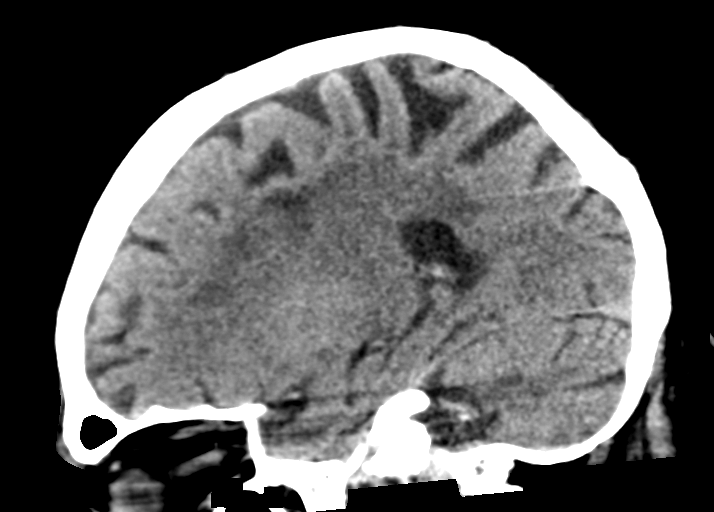
[im 29/58  brain]
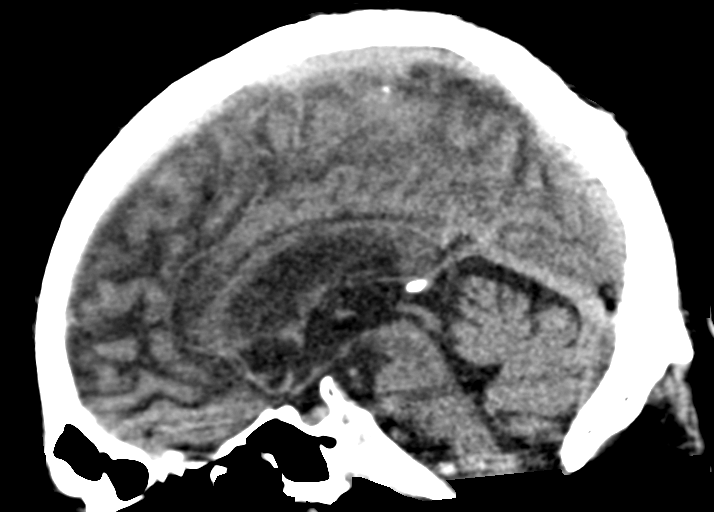
[im 39/58  brain]
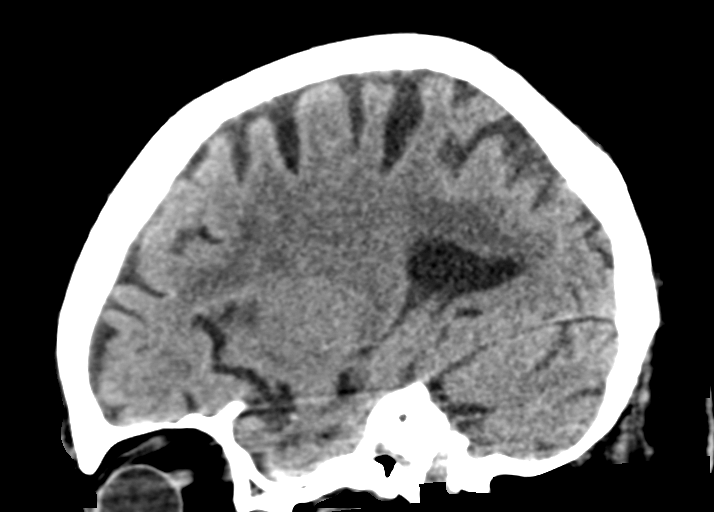

[Series 5: ax head wo · axial · 0.33mm/px · z∈[-105,+13]mm · 8 of 29 slices shown, 10 images]
[im 3/29  brain]
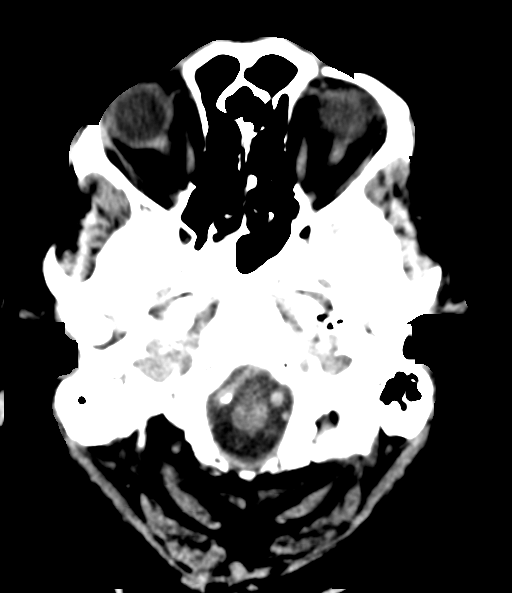
[im 3/29  bone]
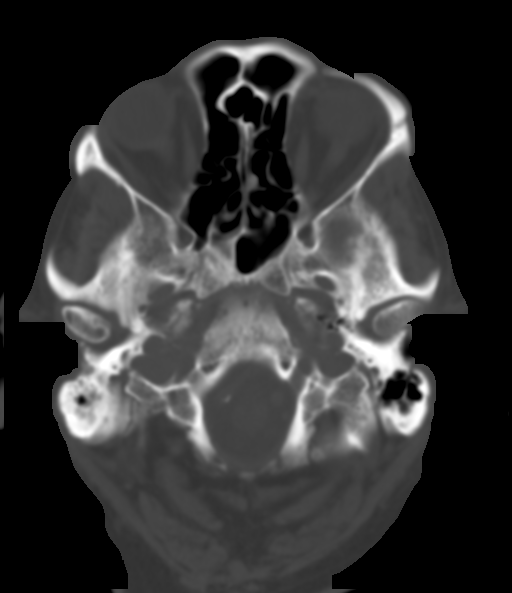
[im 7/29  brain]
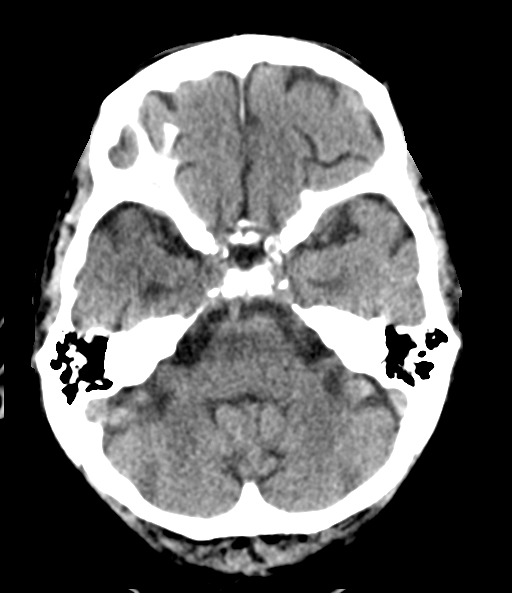
[im 10/29  brain]
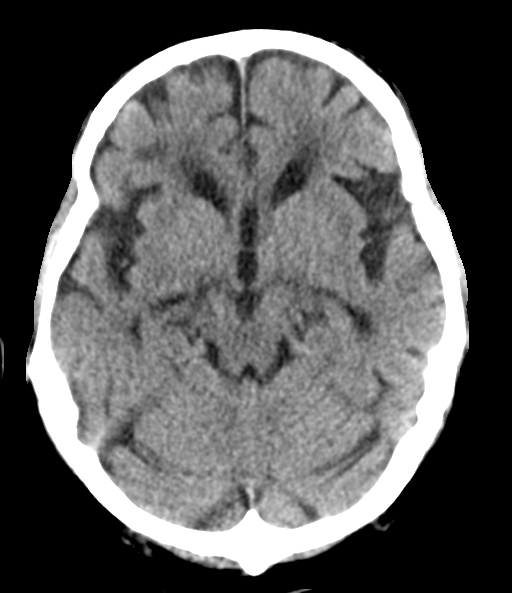
[im 13/29  brain]
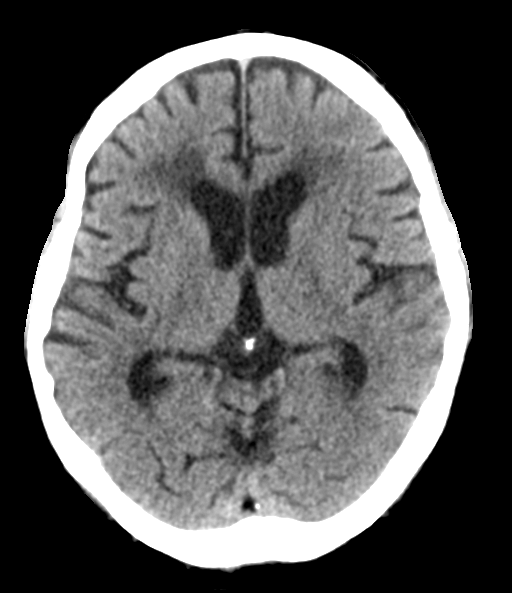
[im 17/29  brain]
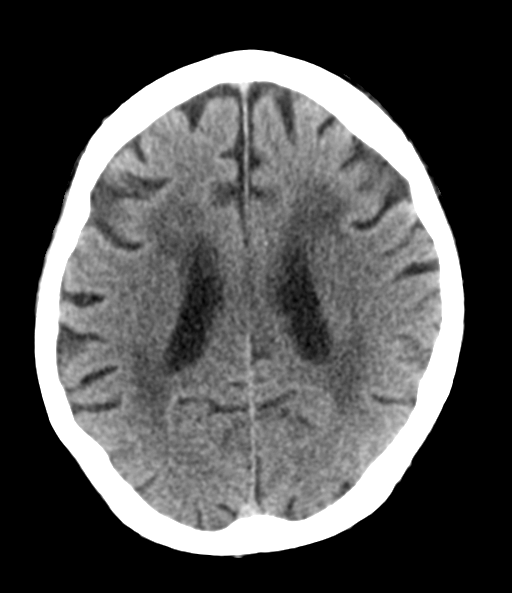
[im 17/29  bone]
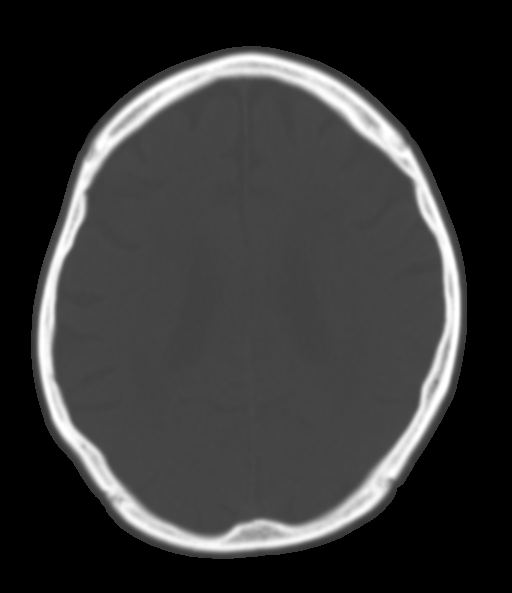
[im 20/29  brain]
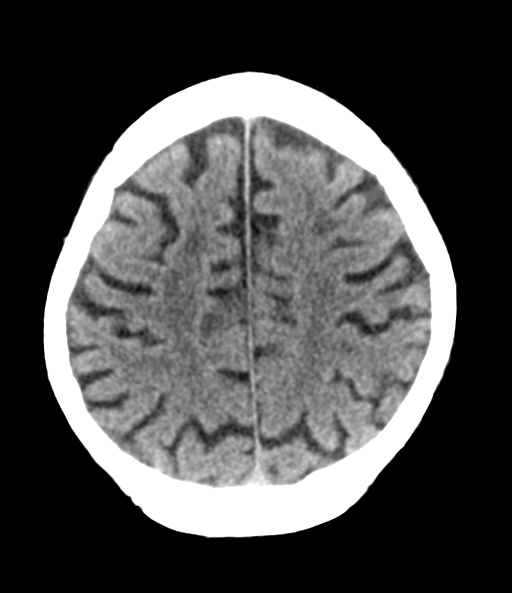
[im 23/29  brain]
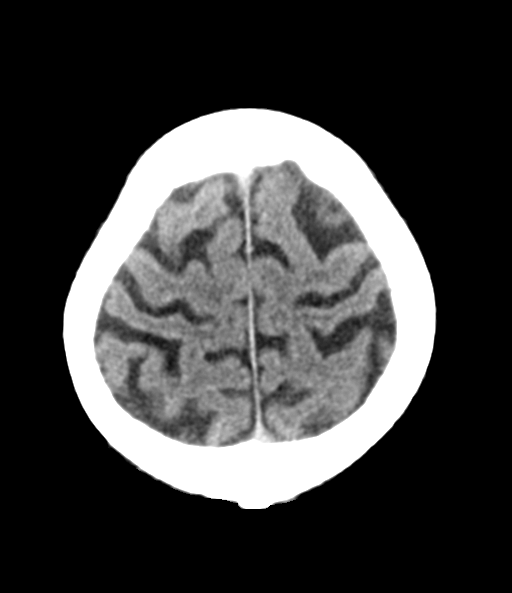
[im 27/29  brain]
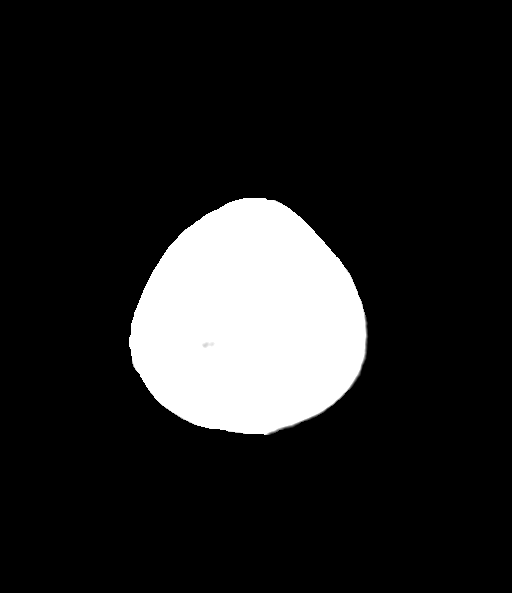

[14 of 46 positions shown; findings below may reference images not displayed]

FINDINGS: Brain: Generalized parenchymal volume loss with commensurate
dilatation of the ventricles and sulci. Ventricles are stable in
size and configuration. Chronic small vessel ischemic changes again
noted within the bilateral periventricular white matter regions.

No mass, hemorrhage, edema or other evidence of acute parenchymal
abnormality. No extra-axial hemorrhage.

Vascular: Chronic calcified atherosclerotic changes of the large
vessels at the skull base. No unexpected hyperdense vessel.

Skull: Normal. Negative for fracture or focal lesion.

Sinuses/Orbits: No acute finding.

Other: None.
IMPRESSION: 1. No acute findings. No intracranial mass, hemorrhage or edema.
2. Chronic small vessel ischemic changes within the white matter.
